# Patient Record
Sex: Male | Born: 1945 | Race: White | Hispanic: No | Smoking: Former smoker
Health system: Southern US, Community
[De-identification: ages and names within clinical notes are randomized; demographics above are authoritative.]

## PROBLEM LIST (undated history)

## (undated) DIAGNOSIS — F4310 Post-traumatic stress disorder, unspecified: Secondary | ICD-10-CM

## (undated) DIAGNOSIS — G4730 Sleep apnea, unspecified: Secondary | ICD-10-CM

## (undated) DIAGNOSIS — I2510 Atherosclerotic heart disease of native coronary artery without angina pectoris: Secondary | ICD-10-CM

## (undated) DIAGNOSIS — Z7739 Contact with and (suspected) exposure to other war theater: Secondary | ICD-10-CM

## (undated) DIAGNOSIS — Z77098 Contact with and (suspected) exposure to other hazardous, chiefly nonmedicinal, chemicals: Secondary | ICD-10-CM

## (undated) DIAGNOSIS — E785 Hyperlipidemia, unspecified: Secondary | ICD-10-CM

## (undated) DIAGNOSIS — J449 Chronic obstructive pulmonary disease, unspecified: Secondary | ICD-10-CM

## (undated) HISTORY — PX: COLONOSCOPY: SHX174

## (undated) HISTORY — DX: Sleep apnea, unspecified: G47.30

## (undated) HISTORY — PX: OTHER SURGICAL HISTORY: SHX169

## (undated) HISTORY — DX: Post-traumatic stress disorder, unspecified: F43.10

## (undated) HISTORY — DX: Chronic obstructive pulmonary disease, unspecified: J44.9

## (undated) HISTORY — DX: Contact with and (suspected) exposure to other war theater: Z77.39

## (undated) HISTORY — DX: Contact with and (suspected) exposure to other hazardous, chiefly nonmedicinal, chemicals: Z77.098

## (undated) HISTORY — PX: TONSILLECTOMY: SUR1361

---

## 1966-04-22 DIAGNOSIS — R69 Illness, unspecified: Secondary | ICD-10-CM | POA: Insufficient documentation

## 2005-03-26 ENCOUNTER — Ambulatory Visit: Payer: Self-pay | Admitting: Internal Medicine

## 2005-04-08 ENCOUNTER — Ambulatory Visit: Payer: Self-pay

## 2005-05-28 ENCOUNTER — Encounter: Payer: Self-pay | Admitting: Internal Medicine

## 2005-05-28 ENCOUNTER — Ambulatory Visit: Payer: Self-pay | Admitting: *Deleted

## 2005-06-06 ENCOUNTER — Ambulatory Visit: Payer: Self-pay | Admitting: Cardiovascular Disease

## 2005-06-06 ENCOUNTER — Ambulatory Visit (HOSPITAL_COMMUNITY): Admission: RE | Admit: 2005-06-06 | Discharge: 2005-06-06 | Payer: Self-pay | Admitting: Cardiovascular Disease

## 2005-06-26 ENCOUNTER — Ambulatory Visit: Payer: Self-pay | Admitting: *Deleted

## 2005-06-26 ENCOUNTER — Encounter: Payer: Self-pay | Admitting: Internal Medicine

## 2005-08-09 ENCOUNTER — Ambulatory Visit: Payer: Self-pay | Admitting: Gastroenterology

## 2005-08-16 ENCOUNTER — Ambulatory Visit: Payer: Self-pay | Admitting: Gastroenterology

## 2005-08-16 ENCOUNTER — Encounter: Payer: Self-pay | Admitting: Internal Medicine

## 2006-01-08 ENCOUNTER — Ambulatory Visit: Payer: Self-pay | Admitting: Internal Medicine

## 2006-01-14 ENCOUNTER — Ambulatory Visit: Payer: Self-pay | Admitting: Internal Medicine

## 2006-01-24 ENCOUNTER — Ambulatory Visit: Payer: Self-pay | Admitting: Cardiology

## 2006-03-05 ENCOUNTER — Ambulatory Visit: Payer: Self-pay | Admitting: Internal Medicine

## 2006-06-20 ENCOUNTER — Ambulatory Visit: Payer: Self-pay | Admitting: Internal Medicine

## 2007-12-24 ENCOUNTER — Telehealth (INDEPENDENT_AMBULATORY_CARE_PROVIDER_SITE_OTHER): Payer: Self-pay | Admitting: *Deleted

## 2007-12-25 ENCOUNTER — Ambulatory Visit: Payer: Self-pay | Admitting: Internal Medicine

## 2007-12-25 DIAGNOSIS — J449 Chronic obstructive pulmonary disease, unspecified: Secondary | ICD-10-CM | POA: Insufficient documentation

## 2007-12-29 ENCOUNTER — Encounter (INDEPENDENT_AMBULATORY_CARE_PROVIDER_SITE_OTHER): Payer: Self-pay | Admitting: *Deleted

## 2007-12-30 ENCOUNTER — Telehealth (INDEPENDENT_AMBULATORY_CARE_PROVIDER_SITE_OTHER): Payer: Self-pay | Admitting: *Deleted

## 2007-12-30 LAB — CONVERTED CEMR LAB
Chloride: 107 meq/L (ref 96–112)
Eosinophils Absolute: 0.2 10*3/uL (ref 0.0–0.7)
HCT: 43.3 % (ref 39.0–52.0)
HDL: 55.4 mg/dL (ref >39.0)
MCV: 101.5 fL — ABNORMAL HIGH (ref 78.0–100.0)
Monocytes Absolute: 0.5 10*3/uL (ref 0.1–1.0)
Neutrophils Relative %: 63.7 % (ref 43.0–77.0)
Platelets: 290 10*3/uL (ref 150–400)
Potassium: 4.4 meq/L (ref 3.5–5.1)
RDW: 12.4 % (ref 11.5–14.6)
Sodium: 142 meq/L (ref 135–145)
Total CHOL/HDL Ratio: 3.1
Triglycerides: 52 mg/dL (ref 0–149)
VLDL: 10 mg/dL (ref 0–40)
WBC: 7 10*3/uL (ref 4.5–10.5)

## 2008-01-07 ENCOUNTER — Ambulatory Visit: Payer: Self-pay | Admitting: Cardiology

## 2008-01-14 ENCOUNTER — Encounter: Payer: Self-pay | Admitting: Internal Medicine

## 2008-01-14 ENCOUNTER — Ambulatory Visit: Payer: Self-pay

## 2008-01-19 ENCOUNTER — Ambulatory Visit: Payer: Self-pay | Admitting: Cardiology

## 2008-01-19 LAB — CONVERTED CEMR LAB
BUN: 17 mg/dL (ref 6–23)
Basophils Relative: 0.8 % (ref 0.0–3.0)
Creatinine, Ser: 0.9 mg/dL (ref 0.4–1.5)
Eosinophils Absolute: 0.2 10*3/uL (ref 0.0–0.7)
Eosinophils Relative: 3.1 % (ref 0.0–5.0)
GFR calc Af Amer: 110 mL/min
GFR calc non Af Amer: 91 mL/min
Glucose, Bld: 85 mg/dL (ref 70–99)
HCT: 37.8 % — ABNORMAL LOW (ref 39.0–52.0)
Hemoglobin: 13.2 g/dL (ref 13.0–17.0)
MCV: 99.4 fL (ref 78.0–100.0)
Monocytes Absolute: 0.7 10*3/uL (ref 0.1–1.0)
Monocytes Relative: 9 % (ref 3.0–12.0)
RBC: 3.8 M/uL — ABNORMAL LOW (ref 4.22–5.81)
WBC: 7.5 10*3/uL (ref 4.5–10.5)

## 2008-01-21 ENCOUNTER — Inpatient Hospital Stay (HOSPITAL_BASED_OUTPATIENT_CLINIC_OR_DEPARTMENT_OTHER): Admission: RE | Admit: 2008-01-21 | Discharge: 2008-01-21 | Payer: Self-pay | Admitting: Cardiovascular Disease

## 2008-01-21 ENCOUNTER — Ambulatory Visit: Payer: Self-pay | Admitting: Cardiovascular Disease

## 2008-01-22 ENCOUNTER — Telehealth (INDEPENDENT_AMBULATORY_CARE_PROVIDER_SITE_OTHER): Payer: Self-pay | Admitting: *Deleted

## 2008-02-09 ENCOUNTER — Ambulatory Visit: Payer: Self-pay | Admitting: Cardiology

## 2008-02-21 ENCOUNTER — Ambulatory Visit (HOSPITAL_BASED_OUTPATIENT_CLINIC_OR_DEPARTMENT_OTHER): Admission: RE | Admit: 2008-02-21 | Discharge: 2008-02-21 | Payer: Self-pay | Admitting: Psychiatry

## 2008-02-27 ENCOUNTER — Ambulatory Visit: Payer: Self-pay | Admitting: Internal Medicine

## 2008-12-08 ENCOUNTER — Telehealth (INDEPENDENT_AMBULATORY_CARE_PROVIDER_SITE_OTHER): Payer: Self-pay | Admitting: *Deleted

## 2008-12-16 ENCOUNTER — Ambulatory Visit: Payer: Self-pay | Admitting: Internal Medicine

## 2008-12-16 ENCOUNTER — Inpatient Hospital Stay (HOSPITAL_COMMUNITY): Admission: AD | Admit: 2008-12-16 | Discharge: 2008-12-19 | Payer: Self-pay | Admitting: Internal Medicine

## 2008-12-16 DIAGNOSIS — F4310 Post-traumatic stress disorder, unspecified: Secondary | ICD-10-CM | POA: Insufficient documentation

## 2008-12-16 DIAGNOSIS — IMO0002 Reserved for concepts with insufficient information to code with codable children: Secondary | ICD-10-CM | POA: Insufficient documentation

## 2008-12-16 DIAGNOSIS — F411 Generalized anxiety disorder: Secondary | ICD-10-CM | POA: Insufficient documentation

## 2008-12-29 ENCOUNTER — Ambulatory Visit: Payer: Self-pay | Admitting: Internal Medicine

## 2008-12-29 DIAGNOSIS — Z87891 Personal history of nicotine dependence: Secondary | ICD-10-CM | POA: Insufficient documentation

## 2009-07-06 ENCOUNTER — Telehealth: Payer: Self-pay | Admitting: Internal Medicine

## 2009-09-05 ENCOUNTER — Ambulatory Visit: Payer: Self-pay | Admitting: Internal Medicine

## 2009-09-05 DIAGNOSIS — G4733 Obstructive sleep apnea (adult) (pediatric): Secondary | ICD-10-CM | POA: Insufficient documentation

## 2009-09-05 DIAGNOSIS — K219 Gastro-esophageal reflux disease without esophagitis: Secondary | ICD-10-CM | POA: Insufficient documentation

## 2009-09-05 LAB — CONVERTED CEMR LAB
Albumin: 4.2 g/dL (ref 3.5–5.2)
BUN: 23 mg/dL (ref 6–23)
Calcium: 9.4 mg/dL (ref 8.4–10.5)
Chloride: 102 meq/L (ref 96–112)
Glucose, Bld: 77 mg/dL (ref 70–99)
Phosphorus: 3.6 mg/dL (ref 2.3–4.6)

## 2009-09-07 ENCOUNTER — Ambulatory Visit: Payer: Self-pay | Admitting: Internal Medicine

## 2009-10-05 ENCOUNTER — Ambulatory Visit: Payer: Self-pay | Admitting: Internal Medicine

## 2009-10-05 DIAGNOSIS — J984 Other disorders of lung: Secondary | ICD-10-CM | POA: Insufficient documentation

## 2009-10-05 DIAGNOSIS — R918 Other nonspecific abnormal finding of lung field: Secondary | ICD-10-CM | POA: Insufficient documentation

## 2009-12-25 ENCOUNTER — Encounter: Payer: Self-pay | Admitting: Internal Medicine

## 2010-01-17 ENCOUNTER — Encounter: Payer: Self-pay | Admitting: Internal Medicine

## 2010-01-17 ENCOUNTER — Ambulatory Visit: Payer: Self-pay | Admitting: Internal Medicine

## 2010-01-17 DIAGNOSIS — R1013 Epigastric pain: Secondary | ICD-10-CM | POA: Insufficient documentation

## 2010-01-17 DIAGNOSIS — R109 Unspecified abdominal pain: Secondary | ICD-10-CM | POA: Insufficient documentation

## 2010-01-17 LAB — CONVERTED CEMR LAB
Albumin: 4.3 g/dL (ref 3.5–5.2)
Alkaline Phosphatase: 74 units/L (ref 39–117)
Basophils Absolute: 0.1 10*3/uL (ref 0.0–0.1)
Basophils Relative: 0.8 % (ref 0.0–3.0)
Eosinophils Absolute: 0.3 10*3/uL (ref 0.0–0.7)
H Pylori IgG: NEGATIVE
Hemoglobin: 14.7 g/dL (ref 13.0–17.0)
Lymphocytes Relative: 14.5 % (ref 12.0–46.0)
MCHC: 34.4 g/dL (ref 30.0–36.0)
MCV: 102.8 fL — ABNORMAL HIGH (ref 78.0–100.0)
Monocytes Absolute: 0.9 10*3/uL (ref 0.1–1.0)
Neutro Abs: 7.9 10*3/uL — ABNORMAL HIGH (ref 1.4–7.7)
RDW: 13.4 % (ref 11.5–14.6)
Total Protein: 7.7 g/dL (ref 6.0–8.3)

## 2010-01-23 ENCOUNTER — Telehealth: Payer: Self-pay | Admitting: Internal Medicine

## 2010-02-12 ENCOUNTER — Telehealth (INDEPENDENT_AMBULATORY_CARE_PROVIDER_SITE_OTHER): Payer: Self-pay | Admitting: *Deleted

## 2010-02-12 ENCOUNTER — Encounter: Payer: Self-pay | Admitting: Internal Medicine

## 2010-02-21 ENCOUNTER — Ambulatory Visit: Payer: Self-pay | Admitting: Internal Medicine

## 2010-02-21 DIAGNOSIS — K5732 Diverticulitis of large intestine without perforation or abscess without bleeding: Secondary | ICD-10-CM | POA: Insufficient documentation

## 2010-02-21 LAB — CONVERTED CEMR LAB
BUN: 24 mg/dL — ABNORMAL HIGH (ref 6–23)
Basophils Relative: 0.6 % (ref 0.0–3.0)
CO2: 31 meq/L (ref 19–32)
Chloride: 102 meq/L (ref 96–112)
Creatinine, Ser: 0.8 mg/dL (ref 0.4–1.5)
Eosinophils Absolute: 0.2 10*3/uL (ref 0.0–0.7)
Eosinophils Relative: 2.7 % (ref 0.0–5.0)
Hemoglobin: 14.4 g/dL (ref 13.0–17.0)
Lymphocytes Relative: 20.4 % (ref 12.0–46.0)
Monocytes Relative: 8.6 % (ref 3.0–12.0)
Neutro Abs: 6.3 10*3/uL (ref 1.4–7.7)
Neutrophils Relative %: 67.7 % (ref 43.0–77.0)
Potassium: 4 meq/L (ref 3.5–5.1)
RBC: 4.04 M/uL — ABNORMAL LOW (ref 4.22–5.81)
WBC: 9.3 10*3/uL (ref 4.5–10.5)

## 2010-03-05 ENCOUNTER — Ambulatory Visit: Payer: Self-pay | Admitting: Internal Medicine

## 2010-05-12 ENCOUNTER — Encounter: Payer: Self-pay | Admitting: Internal Medicine
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
## 2010-05-22 NOTE — Letter (Signed)
Summary: Health Risk Assessment/[employer]  Health Risk Assessment/[employer]   Imported By: Maryln Gottron 01/15/2010 10:49:50  _____________________________________________________________________  External Attachment:    Type:   Image     Comment:   External Document
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
## 2010-05-22 NOTE — Progress Notes (Signed)
  Phone Note Call from Patient Call back at Home Phone [phone]   Caller: Patient Call For: Stacie Glaze MD Reason for Call: Acute Illness Action Taken: Provider Notified Summary of Call: Pt is still having abdomen pain that is worse at night, and does not seem to be getting any better. Initial call taken by: Lynann Beaver CMA,  January 23, 2010 12:03 PM  Follow-up for Phone Call        per dr Turner Daniels wants to try cipro 500 two times a day and flagyl 500 bid for 10 day Follow-up by: Willy Eddy, LPN,  January 23, 2010 12:07 PM    New/Updated Medications: CIPRO 500 MG TABS (CIPROFLOXACIN HCL) one by mouth two times a day x 10 days FLAGYL 500 MG TABS (METRONIDAZOLE) one by mouth bid Prescriptions: FLAGYL 500 MG TABS (METRONIDAZOLE) one by mouth bid  #20 x 0   Entered by:   Lynann Beaver CMA   Authorized by:   Stacie Glaze MD   Signed by:   Lynann Beaver CMA on 01/23/2010   Method used:   Electronically to        [facility]  [address] [phone][facility] (retail)       [address]       [place], [place]  [id number]       Ph: [phone] or [phone]       Fax: [phone]   RxID:   [id number] CIPRO 500 MG TABS (CIPROFLOXACIN HCL) one by mouth two times a day x 10 days  #20 x 0   Entered by:   Lynann Beaver CMA   Authorized by:   Stacie Glaze MD   Signed by:   Lynann Beaver CMA on 01/23/2010   Method used:   Electronically to        [facility]  [address] [phone]* (retail)       [address]       [place], [place]  [id number]       Ph: [phone] or [phone]       Fax: [phone]   RxID:   [id number]  Pt. notified.

## 2010-05-22 NOTE — Progress Notes (Signed)
Summary: New pt for Dr. Orinda Kenner  Phone Note Call from Patient   Caller: Daughter Call For: Dr. Lovell Sheehan Summary of Call: Pt would like to be scheduled with Dr. Lovell Sheehan as a new pt.  Per Dr. Lovell Sheehan.......schedule in a new pt slot. Dedra Skeens (daughter spoke to Dr. Lovell Sheehan). Initial call taken by: Lynann Beaver CMA,  July 06, 2009 1:15 PM  Follow-up for Phone Call        may schdule in 30 min spot Follow-up by: Stacie Glaze MD,  July 07, 2009 8:38 AM  Additional Follow-up for Phone Call Additional follow up Details #1::        09-05-2009 245pm Additional Follow-up by: Heron Sabins,  July 07, 2009 10:18 AM
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
## 2010-05-22 NOTE — Progress Notes (Signed)
  Pt signed ROI, mailed out records today along w/ a CD of his CT [facility]  February 12, 2010 1:28 PM

## 2010-07-04 LAB — PULMONARY FUNCTION TEST

## 2010-07-28 LAB — COMPREHENSIVE METABOLIC PANEL
Albumin: 3.3 g/dL — ABNORMAL LOW (ref 3.5–5.2)
BUN: 6 mg/dL (ref 6–23)
Calcium: 8.8 mg/dL (ref 8.4–10.5)
Creatinine, Ser: 0.8 mg/dL (ref 0.4–1.5)
Glucose, Bld: 101 mg/dL — ABNORMAL HIGH (ref 70–99)
Potassium: 3.9 mEq/L (ref 3.5–5.1)
Total Protein: 6.4 g/dL (ref 6.0–8.3)

## 2010-07-28 LAB — BASIC METABOLIC PANEL
Calcium: 8.7 mg/dL (ref 8.4–10.5)
GFR calc Af Amer: >60 mL/min (ref >60)
GFR calc non Af Amer: >60 mL/min (ref >60)
Sodium: 133 mEq/L — ABNORMAL LOW (ref 135–145)

## 2010-07-28 LAB — PROTIME-INR: INR: 1 (ref 0.00–1.49)

## 2010-07-28 LAB — CBC
HCT: 35.3 % — ABNORMAL LOW (ref 39.0–52.0)
Hemoglobin: 13.2 g/dL (ref 13.0–17.0)
MCHC: 34.3 g/dL (ref 30.0–36.0)
MCV: 101.9 fL — ABNORMAL HIGH (ref 78.0–100.0)
Platelets: 209 10*3/uL (ref 150–400)
RBC: 3.79 MIL/uL — ABNORMAL LOW (ref 4.22–5.81)
RDW: 12.7 % (ref 11.5–15.5)
RDW: 13.4 % (ref 11.5–15.5)

## 2010-07-28 LAB — DIFFERENTIAL
Basophils Absolute: 0 10*3/uL (ref 0.0–0.1)
Lymphocytes Relative: 9 % — ABNORMAL LOW (ref 12–46)
Monocytes Absolute: 0.8 10*3/uL (ref 0.1–1.0)
Monocytes Relative: 6 % (ref 3–12)
Neutro Abs: 11.5 10*3/uL — ABNORMAL HIGH (ref 1.7–7.7)
Neutrophils Relative %: 84 % — ABNORMAL HIGH (ref 43–77)

## 2010-07-28 LAB — CULTURE, ROUTINE-ABSCESS

## 2010-07-28 LAB — ANAEROBIC CULTURE

## 2010-07-28 LAB — TISSUE CULTURE

## 2010-07-28 LAB — APTT: aPTT: 28 seconds (ref 24–37)
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
## 2010-09-04 NOTE — Assessment & Plan Note (Signed)
[facility]                            CARDIOLOGY OFFICE NOTE   Arthur, Harper                    MRN:          [id number]  DATE:02/09/2008                            DOB:          06/14/1945    [name] returns today after having a cardiac catheterization for  left-sided chest pain and a positive stress Myoview showing inferior  ischemia with a question of scar.   Foreseeing, his cardiac catheterization showed no significant coronary  artery disease.  He has normal left ventricular function.   Looking back to his chart, which I did not have on his original visit,  he had previous CT angiogram done by Dr. Charlton Haws in February 2007.  At that time, he had no significant coronary artery disease as well.   MEDICATIONS:  He is on enteric-coated aspirin 81 mg a day and  multivitamin.   He continues to have intermittent chest discomfort that sounds atypical.   PHYSICAL EXAMINATION:  VITAL SIGNS:  His blood pressure is 124/78, his  pulse is 72 and regular, his weight is 188.  HEENT:  Unchanged.  NECK:  Carotids are full.  Thyroid is not enlarged.  LUNGS:  Clear to auscultation and percussion.  HEART:  Regular rate and rhythm.  No gallop.  ABDOMEN:  Soft, good bowel sounds.  No midline bruit.  EXTREMITIES:  No cyanosis, clubbing, or edema.  Pulses are intact.   [name] is doing well.  We have talked about him stopping smoking,  which he will try to do.  We will plan on seeing him back on a p.r.n.  basis.     Thomas C. Daleen Squibb, MD, [facility]  Electronically Signed    TCW/MedQ  DD: 02/09/2008  DT: 02/10/2008  Job #: [id number]   cc:   Willow Ora, MD
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
## 2010-09-04 NOTE — Procedures (Signed)
NAME:  Arthur Harper, Arthur Harper           ACCOUNT NO.:  [id number]   MEDICAL RECORD NO.:  [id number]          PATIENT TYPE:  OUT   LOCATION:  SLEEP CENTER                 FACILITY:  [facility]   PHYSICIAN:  Clinton D. Maple Hudson, MD, FCCP, FACPDATE OF BIRTH:  1945-05-18   DATE OF STUDY:  02/21/2008                            NOCTURNAL POLYSOMNOGRAM   REFERRING PHYSICIAN:  August Saucer   REFERRING PHYSICIAN:  Dr. August Saucer   INDICATION FOR STUDY:  Insomnia with sleep apnea.   EPWORTH SLEEPINESS SCORE:  Epworth sleepiness score 18/24. BMI 26.9.  Weight 182 pounds.  Height 69 inches.  Neck 16.5 inches.   MEDICATIONS:  Home medications are charted and reviewed.   SLEEP ARCHITECTURE:  Total sleep time 310 minutes with sleep efficiency  of 63.8%.  Stage I was 15.9%.  Stage II 58%.  Stage III 14.3%.  REM  11.8% of total sleep time.  Sleep latency 106 minutes.  REM latency 186  minutes. Awake after sleep onset 70 minutes.  Arousal index 28.2.  No  bedtime medication was taken.  Sustained sleep onset was at about 2300  hours with intervals of waking through the night and REM noted at around  4 a.m.   RESPIRATORY DATA:  Apnea-hypopnea index (AHI) 14.9 per hour.  A total of  77 events were recorded including 63 obstructive apneas, 4 central  apneas, 2 mixed apneas, and 8 hypopneas.  The events were not  positional.  REM AHI 4.9.  Split-study protocol was requested, but there  were insufficient events recorded during the initial hours as required  by protocol to permit time for CPAP titration on the study night.   OXYGEN DATA:  Loud snoring with oxygen desaturation to a nadir of 87%.  Mean oxygen saturation through the study was 94.1% on room air.  A total  of 0.2 minutes was recorded with oxygen saturation less than 88% on room  air.   CARDIAC DATA:  Normal sinus rhythm.   MOVEMENT-PARASOMNIA:  Periodic limb movements with arousal.  A total of  293 limb movements were recorded of which 23 were  associated with  arousal or awakening for an index of 4.4 per hour.  Intervals of sleep  talking were recorded during stage II sleep without associated motor  activity.   IMPRESSIONS-RECOMMENDATIONS:  1. Mild-to-moderate obstructive sleep apnea/hypopnea syndrome, AHI      14.9 per hour with nonpositional events, loud snoring, and oxygen      desaturation to a nadir of 87%.  2. Sleep talking associated with stage II sleep without associated EEG      or motor movement abnormality.  3. Consider return for CPAP titration or evaluate for alternative      management as clinically indicated.      Clinton D. Maple Hudson, MD, [facility], FACP  Diplomate, Biomedical engineer of Sleep Medicine  Electronically Signed     CDY/MEDQ  D:  02/27/2008 15:47:00  T:  02/27/2008 21:17:37  Job:  [id number]
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
## 2010-09-04 NOTE — Cardiovascular Report (Signed)
NAMEJUSTYN, LANGHAM           ACCOUNT NO.:  [id number]   MEDICAL RECORD NO.:  [id number]          PATIENT TYPE:  OIB   LOCATION:  1962                         FACILITY:  [facility]   PHYSICIAN:  Verne Carrow, MDDATE OF BIRTH:  Aug 31, 1945   DATE OF PROCEDURE:  01/21/2008  DATE OF DISCHARGE:  01/21/2008                            CARDIAC CATHETERIZATION   INDICATION:  Chest pain in a 65 year old male with a history of tobacco  abuse who was found on a nuclear stress test to have an inferior scar  with peri-infarct ischemia.   OPERATOR:  Verne Carrow, MD   PROCEDURES PERFORMED:  1. Left heart catheterization.  2. Selective coronary angiography.  3. Left ventricular angiogram.   DETAILS OF PROCEDURE:  The patient was brought into the Outpatient Heart  Catheterization Laboratory after signing informed consent.  The right  groin was prepped and draped in a sterile fashion.  A 4-French sheath  was inserted into the right femoral artery.  A JL-5 diagnostic catheter  was used to selectively engage the left main coronary artery and inject  the left coronary system.  A 3DRC catheter was used to selectively  engage and inject the right coronary artery.  A 4-French pigtail  catheter was then used to cross the aortic valve into the left  ventricle.  Following a left ventricular angiogram, catheters were  pullback across the aortic valve with no significant pressure gradient  measured.   ANGIOGRAPHIC FINDINGS:  1. The left main coronary artery has no disease and bifurcates into      the circumflex and the LAD.  2. The left anterior descending artery courses to the apex and has no      angiographic evidence of disease.  It gives off a large diagonal      branch that is free of disease.  There is a smaller second diagonal      branch that is free of disease.  3. The circumflex gives off a small high obtuse marginal branch that      is free of disease and a larger second obtuse  marginal branch that      is free of disease.  4. The right coronary artery is a large dominant vessel that gives off      both the posterior descending branch and a posterolateral branch.      There is no disease noted in the right coronary artery.  5. Left ventricular angiogram demonstrated normal systolic function      with no wall motion abnormalities.  Ejection fraction was estimated      at 55%.   HEMODYNAMIC FINDINGS:  Left ventricular pressure 107/12, end-diastolic  pressure 21, central aortic pressure 110/61.   IMPRESSION:  1. Normal coronary arteries.  2. Normal left ventricular systolic function.   RECOMMENDATIONS:  I do not recommend any further cardiac workup of this  patient's chest pain at the current time.  The patient will be kept here  and monitored with 2 hours of bedrest following the sheath removal.  He  will be discharged to home from an outpatient holding area if he does  well with  his sheath removal and bedrest.  We will plan on having him  followup with Dr. Maisie Fus wall in 3-4 weeks in the [place] office.      Verne Carrow, MD  Electronically Signed     CM/MEDQ  D:  01/21/2008  T:  01/21/2008  Job:  [id number]

## 2010-09-04 NOTE — Op Note (Signed)
Arthur Harper, Arthur Harper           ACCOUNT NO.:  [id number]   MEDICAL RECORD NO.:  [id number]          PATIENT TYPE:  INP   LOCATION:  3737                         FACILITY:  [facility]   PHYSICIAN:  Madelynn Done, MD  DATE OF BIRTH:  04/23/1945   DATE OF PROCEDURE:  12/17/2008  DATE OF DISCHARGE:                               OPERATIVE REPORT   PREOPERATIVE DIAGNOSIS:  Right elbow septic bursitis.   POSTOPERATIVE DIAGNOSIS:  Right elbow septic bursitis.   ATTENDING PHYSICIAN:  Madelynn Done, MD, who scrubbed and present  for the entire procedure.   ASSISTANT SURGEON:  None.   SURGICAL PROCEDURES:  1. Right elbow incision and drainage of olecranon bursa.  2. Olecranon bursectomy.   INTRAOPERATIVE FINDINGS:  The patient did have semi-purulent olecranon  bursal fluid as well as infectious-looking tissue.  Tissue cultures and  fluid cultures were taken.   ANESTHESIA:  General via LMA.   TOURNIQUET TIME:  Zero minutes.   DRAINS:  Two small vessel loops.   SURGICAL INDICATIONS:  Arthur Harper is a 65 year old gentleman with  worsening right elbow pain who was admitted for worsening elbow likely  infection.  The patient was seen and evaluated.  Based on the subjective  and objective findings, it was felt that the patient had a septic  bursitis of the right proximal olecranon.  The risks, benefits, and  alternatives were discussed in detail with the patient and signed  informed consent was obtained.  Risks include but not limited to  bleeding; infection; damage to nearby nerves, arteries or tendons;  recurrent infection; loss motion of the elbow; and need for further  surgical intervention.   DESCRIPTION OF PROCEDURE:  The patient was properly identified in the  preoperative holding area and a mark with a permanent marker made on the  right elbow to indicate correct operative site.  The patient was then  brought back to the operating room, placed supine on the anesthesia  room  table where general anesthesia was administered.  The patient tolerated  this well.  A well-padded tourniquet was then placed on the right  brachium and sealed with a 1000 drape.  The right upper extremity was  then prepped with Hibiclens and sterilely draped.  Time-out was called,  correct site was identified, and procedure then begun.  A curvilinear  incision was then made directly over the proximal olecranon tip curving  radially.  Dissection was then carried down through the skin and  subcutaneous tissues after the appropriate time-out was called.  The  semi-purulent fluid was encountered.  The fluid cultures were then  taken.  Radical olecranon bursectomy was then carried out throughout the  posterior region of the elbow remaining any what appeared to be  nonhealthy and potentially infectious tissue.  This was carried out  sharply with the knife as well as a rongeur.  After bursectomy, the  wound was then thoroughly irrigated.  Copious irrigations were run  throughout the incision site.  Hemostasis was obtained with  electrocautery.  The wound was then copiously irrigated.  The skin was  then closed over 2  small vessel loops with 3-0 nylon suture.  Xeroform  dressings, sterile compressive bandage was then applied.  The patient  was then placed in a well-padded long-arm splint posteriorly.  The  patient was then extubated and taken to recovery room in good condition.   POSTOPERATIVE PLAN:  The patient is continued on IV antibiotics, await  his wound cultures and tissue culture results.  The wound check in  approximately 48 hours to see how he responds to the inpatient IV  antibiotics and a postprocedural care.      Madelynn Done, MD  Electronically Signed     FWO/MEDQ  D:  12/17/2008  T:  12/18/2008  Job:  [id number]

## 2010-09-05 ENCOUNTER — Telehealth: Payer: Self-pay | Admitting: Internal Medicine

## 2010-09-05 NOTE — Telephone Encounter (Signed)
Daughter will bring in ct for dr Lovell Sheehan to see

## 2010-09-05 NOTE — Telephone Encounter (Signed)
Pt had a CT Scan done at [facility] 07/2010 in [place], [place]. Pt is needing Dr Lovell Sheehan to req the results of CT and review them. Pt is req for Dr Lovell Sheehan to call him.

## 2010-09-28 ENCOUNTER — Encounter: Payer: Self-pay | Admitting: Gastroenterology

## 2011-10-15 ENCOUNTER — Encounter: Payer: Self-pay | Admitting: Internal Medicine

## 2011-10-15 ENCOUNTER — Ambulatory Visit (INDEPENDENT_AMBULATORY_CARE_PROVIDER_SITE_OTHER): Payer: Medicare Other | Admitting: Internal Medicine

## 2011-10-15 VITALS — BP 120/76 | HR 72 | Temp 98.6°F | Resp 16 | Ht 69.5 in | Wt 199.0 lb

## 2011-10-15 DIAGNOSIS — R918 Other nonspecific abnormal finding of lung field: Secondary | ICD-10-CM

## 2011-10-15 NOTE — Progress Notes (Signed)
  Subjective:    Patient ID: Arthur Harper, male    DOB: [birth year], 66 y.o.   MRN: [id number]  HPI  Patient had a CT scan at the [place] on 07/03/2011 to exam and history of pulmonary nodules.  Most of the nodules including the upper lobe nodules were unchanged a right lower lobe nodule has a slight interval increase from 7 mm to 9 mm.  This is in the setting of emphysema and biapical pleural thickening. 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 Given the exposure history as well as a slight increase in nodule it might be prudent to involve pulmonary consultation with nodule protocol  [facility] as planned a repeat CT scan in 6 months which would be October  Review of Systems  Constitutional: Positive for fatigue.  HENT: Positive for congestion.   Respiratory: Positive for chest tightness and shortness of breath. Negative for wheezing and stridor.   Cardiovascular: Positive for palpitations and leg swelling. Negative for chest pain.  Gastrointestinal: Positive for abdominal distention.       Objective:   Physical Exam  Nursing note and vitals reviewed. Constitutional: He appears well-developed and well-nourished.  HENT:  Head: Normocephalic and atraumatic.  Cardiovascular: Normal rate.   Murmur heard. Pulmonary/Chest: No respiratory distress. He has no wheezes. He has no rales.  Abdominal: He exhibits no distension. There is no tenderness.          Assessment & Plan:  Refer to Dr. Delton Coombes to look at this pulmonary nodule studies and to make sure that the appropriate protocol is being followed

## 2011-10-15 NOTE — Patient Instructions (Signed)
I am referring you to a [place] pulmonologist

## 2011-11-29 ENCOUNTER — Encounter: Payer: Self-pay | Admitting: Emergency Medicine

## 2011-11-29 ENCOUNTER — Ambulatory Visit (INDEPENDENT_AMBULATORY_CARE_PROVIDER_SITE_OTHER): Payer: Medicare Other | Admitting: Emergency Medicine

## 2011-11-29 VITALS — BP 122/80 | HR 77 | Temp 98.1°F | Ht 69.5 in | Wt 201.6 lb

## 2011-11-29 DIAGNOSIS — G4733 Obstructive sleep apnea (adult) (pediatric): Secondary | ICD-10-CM

## 2011-11-29 DIAGNOSIS — J984 Other disorders of lung: Secondary | ICD-10-CM

## 2011-11-29 DIAGNOSIS — F17200 Nicotine dependence, unspecified, uncomplicated: Secondary | ICD-10-CM

## 2011-11-29 DIAGNOSIS — J449 Chronic obstructive pulmonary disease, unspecified: Secondary | ICD-10-CM

## 2011-11-29 NOTE — Progress Notes (Signed)
Subjective:    Patient ID: Arthur Harper, male    DOB: 16-Mar-1946, 66 y.o.   MRN: [id number]  HPI 66 yo man, current smoker 40 pk-yrs, followed by Dr Lovell Sheehan for COPD (has been prescribed foradil + asmanex in the past by the [place] but has not taken), OSA on CPAP. 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 He has been followed both here and at the [place] in [place]. Has hx multiple pulmonary nodules dating back to scans done 2007. Most recent was 07/03/2011 in [place] - showed stable scattered nodules in a vascular pattern with a RLL nodule that changed from 7mm to 9mm compared with 02/2010.  No new sx. He does cough, has stable exertional SOB. He believes he has a repeat CT scan chest scheduled for 01/2012.    Review of Systems  Constitutional: Negative for fever, chills, diaphoresis, activity change, appetite change, fatigue and unexpected weight change.  HENT: Negative for hearing loss, ear pain, nosebleeds, congestion, sore throat, rhinorrhea, sneezing, mouth sores, trouble swallowing, voice change, postnasal drip, sinus pressure and tinnitus.   Eyes: Negative for discharge, itching and visual disturbance.  Respiratory: Positive for apnea, cough, shortness of breath and wheezing. Negative for choking and chest tightness.   Cardiovascular: Negative for chest pain and palpitations.  Gastrointestinal: Negative for nausea, vomiting, abdominal pain and constipation.  Genitourinary: Negative for dysuria and difficulty urinating.  Musculoskeletal: Negative for back pain, joint swelling and gait problem.  Skin: Negative for rash.  Neurological: Negative for dizziness, tremors, syncope, weakness, light-headedness, numbness and headaches.  Hematological: Does not bruise/bleed easily.  Psychiatric/Behavioral: Negative for confusion, disturbed wake/sleep cycle and agitation. The patient is not nervous/anxious.     Past Medical History  Diagnosis Date    . COPD (chronic obstructive pulmonary disease)   . PTSD (post-traumatic stress disorder)   . Sleep apnea   . Agent orange exposure      Family History  Problem Relation Age of Onset  . Emphysema Mother   . Emphysema Father      History   Social History  . Marital Status: Divorced    Spouse Name: N/A    Number of Children: N/A  . Years of Education: N/A   Occupational History  . retired     Aeronautical engineer   Social History Main Topics  . Smoking status: Current Everyday Smoker -- 1.0 packs/day for 40 years    Types: Cigarettes  . Smokeless tobacco: Never Used  . Alcohol Use: Yes     occassional  . Drug Use: No  . Sexually Active: Not on file   Other Topics Concern  . Not on file   Social History Narrative  . No narrative on file     No Known Allergies   Outpatient Prescriptions Prior to Visit  Medication Sig Dispense Refill  . aspirin 81 MG chewable tablet Chew 81 mg by mouth daily.      . Multiple Vitamin (MULTIVITAMIN) capsule Take 1 capsule by mouth daily.             Objective:   Physical Exam Filed Vitals:   11/29/11 1541  BP: 122/80  Pulse: 77  Temp: 98.1 F (36.7 C)    Gen: Pleasant, obese, in no distress,  normal affect  ENT: No lesions,  mouth clear,  oropharynx clear, no postnasal drip  Neck: No JVD, no TMG, no carotid bruits  Lungs: No use of accessory muscles, no  dullness to percussion, clear without rales or rhonchi  Cardiovascular: RRR, heart sounds normal, no murmur or gallops, no peripheral edema  Musculoskeletal: No deformities, no cyanosis or clubbing  Neuro: alert, non focal  Skin: Warm, no lesions or rashes      Assessment & Plan:  SLEEP APNEA, OBSTRUCTIVE On CPAP and following at [facility]  PULMONARY NODULE Multiple pulm nodules in a vascular pattern. A RLL nodule appears to be slowly enlarging. He needs a 6 month interval film in September or October 2013. [facility]  COPD He  wanted to discuss the meds that he's ben given by [facility] - he's never used them: proventil prn, Foradil, Asmanex.  - we will trial the albuterol prn, see if he notices benefit. If so then will look at starting the foradil + asmanex   CIGARETTE SMO[employer]ER Not ready to set a quit date at this time

## 2011-11-29 NOTE — Assessment & Plan Note (Signed)
Multiple pulm nodules in a vascular pattern. A RLL nodule appears to be slowly enlarging. He needs a 6 month interval film in September or October 2013. We will get [facility] all the [facility] to review

## 2011-11-29 NOTE — Assessment & Plan Note (Signed)
Not ready to set a quit date at this time

## 2011-11-29 NOTE — Patient Instructions (Addendum)
We will get [facility] of [facility] CT [facility] to [facility] You will need a repeat CT scan of the chest in September or October. Please confirm whether this has already been ordered. If not, we will arrange to have a scan performed Try using Proventil (albuterol) 2 puffs up to every 4 hours if needed for shortness of breath Do not start Foradil or Asmanex yet - we will discuss this next time Follow with Dr Delton Coombes in 1 month

## 2011-11-29 NOTE — Assessment & Plan Note (Signed)
On CPAP and following at [facility]

## 2011-11-29 NOTE — Assessment & Plan Note (Signed)
He wanted to discuss the meds that he's ben given by [facility] - he's never used them: proventil prn, Foradil, Asmanex.  - we will trial the albuterol prn, see if he notices benefit. If so then will look at starting the foradil + asmanex

## 2011-12-31 ENCOUNTER — Encounter: Payer: Self-pay | Admitting: Emergency Medicine

## 2011-12-31 ENCOUNTER — Ambulatory Visit (INDEPENDENT_AMBULATORY_CARE_PROVIDER_SITE_OTHER): Payer: Medicare Other | Admitting: Emergency Medicine

## 2011-12-31 VITALS — BP 110/80 | HR 64 | Temp 98.4°F | Ht 69.5 in | Wt 199.2 lb

## 2011-12-31 DIAGNOSIS — G4733 Obstructive sleep apnea (adult) (pediatric): Secondary | ICD-10-CM

## 2011-12-31 DIAGNOSIS — F17200 Nicotine dependence, unspecified, uncomplicated: Secondary | ICD-10-CM

## 2011-12-31 DIAGNOSIS — J984 Other disorders of lung: Secondary | ICD-10-CM

## 2011-12-31 DIAGNOSIS — J449 Chronic obstructive pulmonary disease, unspecified: Secondary | ICD-10-CM

## 2011-12-31 NOTE — Patient Instructions (Addendum)
We will start foradil twice a day and asmanex once a day Continue to have albuterol available to use 2 puffs if needed for shortness of breath We will review your CT scan in October after it is done Congratulations on your plans to stop smoking. We will help you in any way possible Get your flu shot this Fall Continue your CPAP Follow with Dr Delton Coombes in November

## 2011-12-31 NOTE — Assessment & Plan Note (Signed)
We will start the foradil and asmanex since he already has these Encouraged him in his efforts to stop smoking

## 2011-12-31 NOTE — Assessment & Plan Note (Signed)
On wellbutrin, nicotine patch.,  Quit date is 9/12!!

## 2011-12-31 NOTE — Assessment & Plan Note (Signed)
Scheduled for repeat Ct scan 10/9 at the [place]. Will review when available

## 2011-12-31 NOTE — Progress Notes (Signed)
  Subjective:    Patient ID: Arthur Harper, male    DOB: 1946/04/09, 66 y.o.   MRN: [id number]  HPI 66 yo man, current smoker 40 pk-yrs, followed by Dr Lovell Sheehan for COPD (has been prescribed foradil + asmanex in the past by the [place] but has not taken), OSA on CPAP. 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 He has been followed both here and at the [place] in [place]. Has hx multiple pulmonary nodules dating back to scans done 2007. Most recent was 07/03/2011 in [place] - showed stable scattered nodules in a vascular pattern with a RLL nodule that changed from 7mm to 9mm compared with 02/2010.  No new sx. He does cough, has stable exertional SOB. He believes he has a repeat CT scan chest scheduled for 01/2012.   ROV 12/31/11 -- follow up for COPD, OSA and pulmonary nodules, suspected enlarging RLL nodule based on [facility] films. He did a trial of SABA prn, seemed to benefit from this. He is trying to quit smoking, has a quit date of 9/12 - he is on Wellbutrin right now. He is getting support through the [place]. Planning to quit on same date as his daughter. He is scheduled for repeat CT scan of the chest on 10/9 at the [place] to follow RLL nodule.       Objective:   Physical Exam Filed Vitals:   12/31/11 1323  BP: 110/80  Pulse: 64  Temp: 98.4 F (36.9 C)    Gen: Pleasant, obese, in no distress,  normal affect  ENT: No lesions,  mouth clear,  oropharynx clear, no postnasal drip  Neck: No JVD, no TMG, no carotid bruits  Lungs: No use of accessory muscles, no dullness to percussion, clear without rales or rhonchi  Cardiovascular: RRR, heart sounds normal, no murmur or gallops, no peripheral edema  Musculoskeletal: No deformities, no cyanosis or clubbing  Neuro: alert, non focal  Skin: Warm, no lesions or rashes      Assessment & Plan:  SLEEP APNEA, OBSTRUCTIVE Continue CPAP qhs  COPD We will start the foradil and asmanex since he already has  these Encouraged him in his efforts to stop smoking    CIGARETTE SMOKER On wellbutrin, nicotine patch.,  Quit date is 9/12!!   PULMONARY NODULE Scheduled for repeat Ct scan 10/9 at the [place]. Will review when available

## 2011-12-31 NOTE — Assessment & Plan Note (Signed)
Continue CPAP qhs 

## 2012-02-24 ENCOUNTER — Ambulatory Visit (INDEPENDENT_AMBULATORY_CARE_PROVIDER_SITE_OTHER): Payer: Medicare Other | Admitting: Emergency Medicine

## 2012-02-24 ENCOUNTER — Encounter: Payer: Self-pay | Admitting: Emergency Medicine

## 2012-02-24 VITALS — BP 116/68 | HR 64 | Temp 98.3°F | Ht 69.0 in | Wt 207.0 lb

## 2012-02-24 DIAGNOSIS — J984 Other disorders of lung: Secondary | ICD-10-CM

## 2012-02-24 DIAGNOSIS — J449 Chronic obstructive pulmonary disease, unspecified: Secondary | ICD-10-CM

## 2012-02-24 DIAGNOSIS — G4733 Obstructive sleep apnea (adult) (pediatric): Secondary | ICD-10-CM

## 2012-02-24 NOTE — Assessment & Plan Note (Signed)
CPAP.  

## 2012-02-24 NOTE — Progress Notes (Signed)
  Subjective:    Patient ID: Arthur Harper, male    DOB: May 08, 1945, 66 y.o.   MRN: [id number]  HPI 66 yo man, current smoker 40 pk-yrs, followed by Dr Lovell Sheehan for COPD (has been prescribed foradil + asmanex in the past by the [place] but has not taken), OSA on CPAP. 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 He has been followed both here and at the [place] in [place]. Has hx multiple pulmonary nodules dating back to scans done 2007. Most recent was 07/03/2011 in [place] - showed stable scattered nodules in a vascular pattern with a RLL nodule that changed from 7mm to 9mm compared with 02/2010.  No new sx. He does cough, has stable exertional SOB. He believes he has a repeat CT scan chest scheduled for 01/2012.   ROV 12/31/11 -- follow up for COPD, OSA and pulmonary nodules, suspected enlarging RLL nodule based on [facility] films. He did a trial of SABA prn, seemed to benefit from this. He is trying to quit smoking, has a quit date of 9/12 - he is on Wellbutrin right now. He is getting support through the [place]. Planning to quit on same date as his daughter. He is scheduled for repeat CT scan of the chest on 10/9 at the [place] to follow RLL nodule.    ROV 02/24/12 -- COPD, OSA and pulmonary nodules, suspected enlarging RLL nodule based on [facility] films (7mm > 9mm on march 2013 film). The new film on 10/9 looks to be stable, with possible slight decrease in size of the RLL nodule that was thought to be growing. Since last time he quit smoking! Rare SABA use. He is on foradil and asmanex. Breathing better on the meds. Reliable with CPAP      Objective:   Physical Exam Filed Vitals:   02/24/12 1504  BP: 116/68  Pulse: 64  Temp: 98.3 F (36.8 C)    Gen: Pleasant, obese, in no distress,  normal affect  ENT: No lesions,  mouth clear,  oropharynx clear, no postnasal drip  Neck: No JVD, no TMG, no carotid bruits  Lungs: No use of accessory muscles, no dullness to  percussion, clear without rales or rhonchi  Cardiovascular: RRR, heart sounds normal, no murmur or gallops, no peripheral edema  Musculoskeletal: No deformities, no cyanosis or clubbing  Neuro: alert, non focal  Skin: Warm, no lesions or rashes      Assessment & Plan:  PULMONARY NODULE Scattered stable nodules, and a RLL nodule that had gone from 7mm to 9mm. The repeat film 01/29/12 >> nodule looks stable. [facility] is planning to repeat in 1 year.   COPD He has stopped smoking - asmanex + foradil  SLEEP APNEA, OBSTRUCTIVE CPAP>

## 2012-02-24 NOTE — Assessment & Plan Note (Signed)
Scattered stable nodules, and a RLL nodule that had gone from 7mm to 9mm. The repeat film 01/29/12 >> nodule looks stable. VA is planning to repeat in 1 year.

## 2012-02-24 NOTE — Patient Instructions (Addendum)
CONGRATULATIONS on quitting smoking! Please continue your Asmanex and Foradil Use albuterol inhaler as needed You will need a repeat CT scan of the chest in 1 year through the [place] Follow with Dr Delton Coombes in 6 months or sooner if you have any problems

## 2012-02-24 NOTE — Assessment & Plan Note (Signed)
He has stopped smoking - asmanex + foradil

## 2012-08-13 ENCOUNTER — Encounter: Payer: Self-pay | Admitting: Gastroenterology

## 2012-08-14 ENCOUNTER — Ambulatory Visit (INDEPENDENT_AMBULATORY_CARE_PROVIDER_SITE_OTHER): Payer: Medicare Other | Admitting: Internal Medicine

## 2012-08-14 ENCOUNTER — Encounter: Payer: Self-pay | Admitting: Internal Medicine

## 2012-08-14 VITALS — BP 128/80 | HR 72 | Temp 98.6°F | Resp 16 | Ht 69.0 in | Wt 210.0 lb

## 2012-08-14 DIAGNOSIS — Z23 Encounter for immunization: Secondary | ICD-10-CM

## 2012-08-14 DIAGNOSIS — Z0000 Encounter for general adult medical examination without abnormal findings: Secondary | ICD-10-CM

## 2012-08-14 LAB — BASIC METABOLIC PANEL
Calcium: 9.6 mg/dL (ref 8.4–10.5)
Creatinine, Ser: 0.8 mg/dL (ref 0.4–1.5)
GFR: 98.37 mL/min (ref >60.00)
Sodium: 138 mEq/L (ref 135–145)

## 2012-08-14 LAB — CBC WITH DIFFERENTIAL/PLATELET
Basophils Absolute: 0 10*3/uL (ref 0.0–0.1)
Eosinophils Absolute: 0.3 10*3/uL (ref 0.0–0.7)
HCT: 42.6 % (ref 39.0–52.0)
Hemoglobin: 14.4 g/dL (ref 13.0–17.0)
Lymphs Abs: 1.7 10*3/uL (ref 0.7–4.0)
MCHC: 33.8 g/dL (ref 30.0–36.0)
Monocytes Absolute: 0.7 10*3/uL (ref 0.1–1.0)
Neutro Abs: 3.8 10*3/uL (ref 1.4–7.7)
Platelets: 282 10*3/uL (ref 150.0–400.0)
RDW: 13.2 % (ref 11.5–14.6)

## 2012-08-14 LAB — POCT URINALYSIS DIPSTICK
Bilirubin, UA: NEGATIVE
Ketones, UA: NEGATIVE
Leukocytes, UA: NEGATIVE
pH, UA: 7

## 2012-08-14 LAB — HEPATIC FUNCTION PANEL
Albumin: 4.2 g/dL (ref 3.5–5.2)
Alkaline Phosphatase: 64 U/L (ref 39–117)
Bilirubin, Direct: 0.2 mg/dL (ref 0.0–0.3)
Total Protein: 6.9 g/dL (ref 6.0–8.3)

## 2012-08-14 LAB — LIPID PANEL
HDL: 54.1 mg/dL (ref >39.00)
Total CHOL/HDL Ratio: 3
Triglycerides: 65 mg/dL (ref 0.0–149.0)
VLDL: 13 mg/dL (ref 0.0–40.0)

## 2012-08-14 LAB — TSH: TSH: 1.85 u[IU]/mL (ref 0.35–5.50)

## 2012-08-14 NOTE — Addendum Note (Signed)
Addended by: Willy Eddy on: 08/14/2012 12:28 PM   Modules accepted: Orders

## 2012-08-14 NOTE — Progress Notes (Signed)
Subjective:    Patient ID: Arthur Harper, male    DOB: 29-Jan-1946, 67 y.o.   MRN: [id number]  HPI Weight gain and diet Has stopped smoking CPX Hx of COPD and has stopped smoking  Does not use the inhailer   Review of Systems  Constitutional: Negative for fever and fatigue.  HENT: Negative for hearing loss, congestion, neck pain and postnasal drip.   Eyes: Negative for discharge, redness and visual disturbance.  Respiratory: Negative for cough, shortness of breath and wheezing.   Cardiovascular: Negative for leg swelling.  Gastrointestinal: Negative for abdominal pain, constipation and abdominal distention.  Genitourinary: Negative for urgency and frequency.  Musculoskeletal: Negative for joint swelling and arthralgias.  Skin: Negative for color change and rash.  Neurological: Negative for weakness and light-headedness.  Hematological: Negative for adenopathy.  Psychiatric/Behavioral: Negative for behavioral problems.   Past Medical History  Diagnosis Date  . COPD (chronic obstructive pulmonary disease)   . PTSD (post-traumatic stress disorder)   . Sleep apnea   . Agent orange exposure     History   Social History  . Marital Status: Divorced    Spouse Name: N/A    Number of Children: N/A  . Years of Education: N/A   Occupational History  . retired     Aeronautical engineer   Social History Main Topics  . Smoking status: Former Smoker -- 1.00 packs/day for 40 years    Types: Cigarettes    Quit date: 01/01/2012  . Smokeless tobacco: Never Used     Comment: 10 cigs per day  . Alcohol Use: Yes     Comment: occassional  . Drug Use: No  . Sexually Active: Not on file   Other Topics Concern  . Not on file   Social History Narrative  . No narrative on file    Past Surgical History  Procedure Laterality Date  . Tonsillectomy    . Fx ribs      Family History  Problem Relation Age of Onset  . Emphysema Mother   . Emphysema Father     No Known  Allergies  Current Outpatient Prescriptions on File Prior to Visit  Medication Sig Dispense Refill  . albuterol (PROVENTIL HFA;VENTOLIN HFA) 108 (90 BASE) MCG/ACT inhaler Inhale 2 puffs into the lungs every 6 (six) hours as needed.      Marland Kitchen aspirin 81 MG chewable tablet Chew 81 mg by mouth daily.      . Multiple Vitamin (MULTIVITAMIN) capsule Take 1 capsule by mouth daily.       No current facility-administered medications on file prior to visit.    BP 128/80  Pulse 72  Temp(Src) 98.6 F (37 C)  Resp 16  Ht 5\' 9"  (1.753 m)  Wt 210 lb (95.255 kg)  BMI 31 kg/m2   Past Medical History  Diagnosis Date  . COPD (chronic obstructive pulmonary disease)   . PTSD (post-traumatic stress disorder)   . Sleep apnea   . Agent orange exposure     History   Social History  . Marital Status: Divorced    Spouse Name: N/A    Number of Children: N/A  . Years of Education: N/A   Occupational History  . retired     Aeronautical engineer   Social History Main Topics  . Smoking status: Former Smoker -- 1.00 packs/day for 40 years    Types: Cigarettes    Quit date: 01/01/2012  . Smokeless tobacco: Never Used     Comment: 10  cigs per day  . Alcohol Use: Yes     Comment: occassional  . Drug Use: No  . Sexually Active: Not on file   Other Topics Concern  . Not on file   Social History Narrative  . No narrative on file    Past Surgical History  Procedure Laterality Date  . Tonsillectomy    . Fx ribs      Family History  Problem Relation Age of Onset  . Emphysema Mother   . Emphysema Father     No Known Allergies  Current Outpatient Prescriptions on File Prior to Visit  Medication Sig Dispense Refill  . albuterol (PROVENTIL HFA;VENTOLIN HFA) 108 (90 BASE) MCG/ACT inhaler Inhale 2 puffs into the lungs every 6 (six) hours as needed.      Marland Kitchen aspirin 81 MG chewable tablet Chew 81 mg by mouth daily.      . Multiple Vitamin (MULTIVITAMIN) capsule Take 1 capsule by mouth daily.        No current facility-administered medications on file prior to visit.    BP 128/80  Pulse 72  Temp(Src) 98.6 F (37 C)  Resp 16  Ht 5\' 9"  (1.753 m)  Wt 210 lb (95.255 kg)  BMI 31 kg/m2        Objective:   Physical Exam  Nursing note and vitals reviewed. Constitutional: He is oriented to person, place, and time. He appears well-developed and well-nourished.  HENT:  Head: Normocephalic.  Eyes: Conjunctivae are normal. Pupils are equal, round, and reactive to light.  Cardiovascular: Normal rate and regular rhythm.   Pulmonary/Chest: Effort normal and breath sounds normal.  Abdominal: Soft. Bowel sounds are normal.  Genitourinary: Rectum normal.  ,mild BPH  Neurological: He is alert and oriented to person, place, and time.  Skin: Skin is warm.  Psychiatric: He has a normal mood and affect. His behavior is normal.          Assessment & Plan:   Patient presents for yearly preventative medicine examination.   all immunizations and health maintenance protocols were reviewed with the patient and they are up to date with these protocols.   screening laboratory values were reviewed with the patient including screening of hyperlipidemia PSA renal function and hepatic function.   There medications past medical history social history problem list and allergies were reviewed in detail.   Goals were established with regard to weight loss exercise diet in compliance with medications

## 2012-08-14 NOTE — Patient Instructions (Addendum)
The patient is instructed to continue all medications as prescribed. Schedule followup with check out clerk upon leaving the clinic  

## 2012-09-02 ENCOUNTER — Encounter: Payer: Self-pay | Admitting: Emergency Medicine

## 2012-09-02 ENCOUNTER — Ambulatory Visit (INDEPENDENT_AMBULATORY_CARE_PROVIDER_SITE_OTHER): Payer: Medicare Other | Admitting: Emergency Medicine

## 2012-09-02 VITALS — BP 110/80 | HR 70 | Temp 97.4°F | Ht 67.0 in | Wt 209.4 lb

## 2012-09-02 DIAGNOSIS — J984 Other disorders of lung: Secondary | ICD-10-CM

## 2012-09-02 DIAGNOSIS — G4733 Obstructive sleep apnea (adult) (pediatric): Secondary | ICD-10-CM

## 2012-09-02 DIAGNOSIS — J449 Chronic obstructive pulmonary disease, unspecified: Secondary | ICD-10-CM

## 2012-09-02 NOTE — Assessment & Plan Note (Signed)
-   planning for repeat CT scan chest in a year, last was 10/'13, to be done at the [place].
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
## 2012-09-02 NOTE — Patient Instructions (Addendum)
Get your CT scan in October through the [facility] Get your CPAP repaired  Continue to use proventil 2 puffs up to every 4 hours if needed for shortness of breath Follow with Dr Delton Coombes in November 2014 or sooner if you have any problems.

## 2012-09-02 NOTE — Assessment & Plan Note (Signed)
Stable off scheduled BD's for now - continue proventil prn,  - restart scheduled meds if he declines in any way.  - rov November
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
## 2012-11-20 ENCOUNTER — Encounter: Payer: Medicare Other | Admitting: Internal Medicine

## 2012-12-22 ENCOUNTER — Other Ambulatory Visit: Payer: Self-pay | Admitting: Internal Medicine

## 2012-12-22 DIAGNOSIS — Z0000 Encounter for general adult medical examination without abnormal findings: Secondary | ICD-10-CM

## 2012-12-25 ENCOUNTER — Ambulatory Visit (INDEPENDENT_AMBULATORY_CARE_PROVIDER_SITE_OTHER): Payer: Medicare Other | Admitting: Family

## 2012-12-25 ENCOUNTER — Encounter: Payer: Self-pay | Admitting: Family

## 2012-12-25 VITALS — BP 138/70 | HR 67 | Wt 208.0 lb

## 2012-12-25 DIAGNOSIS — H60399 Other infective otitis externa, unspecified ear: Secondary | ICD-10-CM

## 2012-12-25 DIAGNOSIS — H9202 Otalgia, left ear: Secondary | ICD-10-CM

## 2012-12-25 DIAGNOSIS — H9209 Otalgia, unspecified ear: Secondary | ICD-10-CM

## 2012-12-25 DIAGNOSIS — H60392 Other infective otitis externa, left ear: Secondary | ICD-10-CM

## 2012-12-25 MED ORDER — NEOMYCIN-COLIST-HC-THONZONIUM 3.3-3-10-0.5 MG/ML OT SUSP: 3.0000 [drp] | Freq: Four times a day (QID) | OTIC | Status: DC

## 2012-12-25 NOTE — Patient Instructions (Signed)
Otitis Externa Otitis externa is a bacterial or fungal infection of the outer ear canal. This is the area from the eardrum to the outside of the ear. Otitis externa is sometimes called "swimmer's ear." CAUSES  Possible causes of infection include:  Swimming in dirty water.  Moisture remaining in the ear after swimming or bathing.  Mild injury (trauma) to the ear.  Objects stuck in the ear (foreign body).  Cuts or scrapes (abrasions) on the outside of the ear. SYMPTOMS  The first symptom of infection is often itching in the ear canal. Later signs and symptoms may include swelling and redness of the ear canal, ear pain, and yellowish-white fluid (pus) coming from the ear. The ear pain may be worse when pulling on the earlobe. DIAGNOSIS  Your caregiver will perform a physical exam. A sample of fluid may be taken from the ear and examined for bacteria or fungi. TREATMENT  Antibiotic ear drops are often given for 10 to 14 days. Treatment may also include pain medicine or corticosteroids to reduce itching and swelling. PREVENTION   Keep your ear dry. Use the corner of a towel to absorb water out of the ear canal after swimming or bathing.  Avoid scratching or putting objects inside your ear. This can damage the ear canal or remove the protective wax that lines the canal. This makes it easier for bacteria and fungi to grow.  Avoid swimming in lakes, polluted water, or poorly chlorinated pools.  You may use ear drops made of rubbing alcohol and vinegar after swimming. Combine equal parts of white vinegar and alcohol in a bottle. Put 3 or 4 drops into each ear after swimming. HOME CARE INSTRUCTIONS   Apply antibiotic ear drops to the ear canal as prescribed by your caregiver.  Only take over-the-counter or prescription medicines for pain, discomfort, or fever as directed by your caregiver.  If you have diabetes, follow any additional treatment instructions from your caregiver.  Keep all  follow-up appointments as directed by your caregiver. SEEK MEDICAL CARE IF:   You have a fever.  Your ear is still red, swollen, painful, or draining pus after 3 days.  Your redness, swelling, or pain gets worse.  You have a severe headache.  You have redness, swelling, pain, or tenderness in the area behind your ear. MAKE SURE YOU:   Understand these instructions.  Will watch your condition.  Will get help right away if you are not doing well or get worse. Document Released: 04/08/2005 Document Revised: 07/01/2011 Document Reviewed: 04/25/2011 Exit[employer] Patient Information 2014 [employer].  

## 2012-12-25 NOTE — Progress Notes (Signed)
Subjective:    Patient ID: Arthur Harper, male    DOB: 12/26/1945, 67 y.o.   MRN: [id number]  HPI 67 year old white male, nonsmoker, patient of Dr. Lovell Sheehan is in today with complaints of left ear pain x2 weeks and worsening over the weekend. Denies any sneezing, cough or congestion. Reports he has been swimming in a lake over the weekend.  Review of Systems  Constitutional: Negative.   HENT: Positive for ear pain. Negative for congestion, rhinorrhea, sneezing and ear discharge.   Respiratory: Negative.   Cardiovascular: Negative.   Skin: Negative.   Allergic/Immunologic: Negative.   Neurological: Negative.   Hematological: Negative.   Psychiatric/Behavioral: Negative.    Past Medical History  Diagnosis Date  . COPD (chronic obstructive pulmonary disease)   . PTSD (post-traumatic stress disorder)   . Sleep apnea   . Agent orange exposure     History   Social History  . Marital Status: Divorced    Spouse Name: N/A    Number of Children: N/A  . Years of Education: N/A   Occupational History  . retired     Aeronautical engineer   Social History Main Topics  . Smoking status: Former Smoker -- 1.00 packs/day for 40 years    Types: Cigarettes    Quit date: 01/01/2012  . Smokeless tobacco: Never Used     Comment: 10 cigs per day  . Alcohol Use: Yes     Comment: occassional  . Drug Use: No  . Sexual Activity: Not on file   Other Topics Concern  . Not on file   Social History Narrative  . No narrative on file    Past Surgical History  Procedure Laterality Date  . Tonsillectomy    . Fx ribs      Family History  Problem Relation Age of Onset  . Emphysema Mother   . Emphysema Father     No Known Allergies  Current Outpatient Prescriptions on File Prior to Visit  Medication Sig Dispense Refill  . albuterol (PROVENTIL HFA;VENTOLIN HFA) 108 (90 BASE) MCG/ACT inhaler Inhale 2 puffs into the lungs every 6 (six) hours as needed.      Marland Kitchen aspirin 81 MG chewable  tablet Chew 81 mg by mouth daily.      . Multiple Vitamin (MULTIVITAMIN) capsule Take 1 capsule by mouth daily.       No current facility-administered medications on file prior to visit.    BP 138/70  Pulse 67  Wt 208 lb (94.348 kg)  BMI 32.57 kg/m2chart    Objective:   Physical Exam  Constitutional: He is oriented to person, place, and time. He appears well-developed and well-nourished.  HENT:  Right Ear: External ear normal.  Nose: Nose normal.  Mouth/Throat: Oropharynx is clear and moist.  Left tragal tenderness to palpation. External auditory canal red, swollen and very tender. Tympanic membranes normal  Neck: Normal range of motion. Neck supple.  Cardiovascular: Normal rate, regular rhythm and normal heart sounds.   Pulmonary/Chest: Effort normal and breath sounds normal.  Neurological: He is alert and oriented to person, place, and time.  Skin: Skin is warm and dry.  Psychiatric: He has a normal mood and affect.          Assessment & Plan:  Assessment: 1. Acute otitis externa left ear 2. Otalgia  Plan: Cortisporin otic 3 drops in the left ear every 6 hours. Ibuprofen 600 mg 3 times a day as needed with food. Patient probably off if  the symptoms worsen or persist. Recheck as scheduled, and if needed.

## 2012-12-30 ENCOUNTER — Encounter: Payer: Self-pay | Admitting: Gastroenterology

## 2013-01-06 ENCOUNTER — Ambulatory Visit (AMBULATORY_SURGERY_CENTER): Payer: Self-pay | Admitting: *Deleted

## 2013-01-06 VITALS — Ht 69.0 in | Wt 209.4 lb

## 2013-01-06 DIAGNOSIS — Z86010 Personal history of colonic polyps: Secondary | ICD-10-CM

## 2013-01-06 MED ORDER — MOVIPREP 100 G PO SOLR: 1.0000 | Freq: Once | ORAL | Status: DC

## 2013-01-06 NOTE — Progress Notes (Signed)
No egg or soy allergy. No anesthesia problems.  pt has been having blood in his urine and is having a cystoscopy on 9/23. [facility], so to be safe changed pt prep from suprep to movi prep-adm

## 2013-01-07 ENCOUNTER — Encounter: Payer: Self-pay | Admitting: Gastroenterology

## 2013-01-20 ENCOUNTER — Encounter: Payer: Self-pay | Admitting: Gastroenterology

## 2013-01-20 ENCOUNTER — Ambulatory Visit (AMBULATORY_SURGERY_CENTER): Payer: Medicare Other | Admitting: Gastroenterology

## 2013-01-20 VITALS — BP 114/46 | HR 61 | Temp 97.0°F | Resp 13 | Ht 69.0 in | Wt 209.0 lb

## 2013-01-20 DIAGNOSIS — D126 Benign neoplasm of colon, unspecified: Secondary | ICD-10-CM

## 2013-01-20 DIAGNOSIS — Z86010 Personal history of colonic polyps: Secondary | ICD-10-CM

## 2013-01-20 MED ORDER — SODIUM CHLORIDE 0.9 % IV SOLN: 500.0000 mL | INTRAVENOUS | Status: DC

## 2013-01-20 NOTE — Progress Notes (Signed)
Patient did not experience any of the following events: a burn prior to discharge; a fall within the facility; wrong site/side/patient/procedure/implant event; or a hospital transfer or hospital admission upon discharge from the facility. (G8907) Patient did not have preoperative order for IV antibiotic SSI prophylaxis. (G8918)  

## 2013-01-20 NOTE — Patient Instructions (Addendum)

## 2013-01-20 NOTE — Op Note (Signed)
[facility] [address] [place], [address]   COLONOSCOPY PROCEDURE REPORT  PATIENT: [name], [name]  MR#: [id number] BIRTHDATE: [birth year] , 66  yrs. old GENDER: Male ENDOSCOPIST: Louis Meckel, MD REFERRED BY: PROCEDURE DATE:  01/20/2013 PROCEDURE:   Colonoscopy with cold biopsy polypectomy First Screening Colonoscopy - Avg.  risk and is 50 yrs.  old or older - No.  Prior Negative Screening - Now for repeat screening. N/A  History of Adenoma - Now for follow-up colonoscopy & has been > or = to 3 yrs.  Yes hx of adenoma.  Has been 3 or more years since last colonoscopy.  Polyps Removed Today? Yes. ASA CLASS:   Class II INDICATIONS: MEDICATIONS: MAC sedation, administered by CRNA and propofol (Diprivan) 200mg  IV  DESCRIPTION OF PROCEDURE:   After the risks benefits and alternatives of the procedure were thoroughly explained, informed consent was obtained.  A digital rectal exam revealed no abnormalities of the rectum.   The     endoscope was introduced through the anus and advanced to the cecum, which was identified by both the appendix and ileocecal valve. No adverse events experienced.   The quality of the prep was excellent using Suprep The instrument was then slowly withdrawn as the colon was fully examined.      COLON FINDINGS: A sessile polyp measuring 2 mm in size was found in the rectum.  A polypectomy was performed with cold forceps.   The colon mucosa was otherwise normal.  Retroflexed views revealed no abnormalities. The time to cecum=2 minutes 18 seconds.  Withdrawal time=10 minutes 15 seconds.  The scope was withdrawn and the procedure completed. COMPLICATIONS: There were no complications.  ENDOSCOPIC IMPRESSION: 1.   Sessile polyp measuring 2 mm in size was found in the rectum; polypectomy was performed with cold forceps 2.   The colon mucosa was otherwise normal  RECOMMENDATIONS: If the polyp(s) removed today are proven to be  adenomatous (pre-cancerous) polyps, you will need a repeat colonoscopy in 5 years.  Otherwise you should continue to follow colorectal cancer screening guidelines for "routine risk" patients with colonoscopy in 10 years.  You will receive a letter within 1-2 weeks with the results of your biopsy as well as final recommendations.  Please call my office if you have not received a letter after 3 weeks.   eSigned:  Louis Meckel, MD 01/20/2013 2:52 PM   cc: Stacie Glaze, MD and Bernadette Hoit, MD   PATIENT NAME:  Arthur Harper, Arthur Harper MR#: [id number]

## 2013-01-20 NOTE — Progress Notes (Signed)
Called to room to assist during endoscopic procedure.  Patient ID and intended procedure confirmed with present staff. Received instructions for my participation in the procedure from the performing physician.  

## 2013-01-20 NOTE — Progress Notes (Signed)
Lidocaine-40mg IV prior to Propofol InductionPropofol given over incremental dosages 

## 2013-01-21 ENCOUNTER — Telehealth: Payer: Self-pay | Admitting: *Deleted

## 2013-01-21 NOTE — Telephone Encounter (Signed)
Left message that we called for f/u 

## 2013-01-26 ENCOUNTER — Encounter: Payer: Self-pay | Admitting: Gastroenterology

## 2014-07-21 ENCOUNTER — Telehealth: Payer: Self-pay

## 2014-07-21 NOTE — Telephone Encounter (Signed)
07/21/14 Disc received from [facility] and filed on shelf.Britt Bottom

## 2016-03-29 ENCOUNTER — Telehealth (HOSPITAL_COMMUNITY): Payer: Self-pay

## 2016-03-29 NOTE — Telephone Encounter (Signed)
Called patient in regards to Pulmonary Rehab referral from Dr. Gwenette Greet at the [facility]. Patient was in the DR.'s office and stated he would call me back at a later time.

## 2016-04-26 ENCOUNTER — Encounter (HOSPITAL_COMMUNITY)
Admission: RE | Admit: 2016-04-26 | Discharge: 2016-04-26 | Disposition: A | Discharge status: Home / Self Care | Payer: Non-veteran care | Source: Ambulatory Visit | Attending: Pulmonary Disease | Admitting: Pulmonary Disease

## 2016-04-26 ENCOUNTER — Encounter (HOSPITAL_COMMUNITY): Payer: Self-pay

## 2016-04-26 VITALS — BP 134/72 | HR 62 | Resp 18 | Ht 67.0 in | Wt 222.4 lb

## 2016-04-26 DIAGNOSIS — J439 Emphysema, unspecified: Secondary | ICD-10-CM | POA: Diagnosis not present

## 2016-04-26 NOTE — Progress Notes (Signed)
Arthur Harper 71 y.o. male Pulmonary Rehab Orientation Note Patient arrived today in Cardiac and Pulmonary Rehab for orientation to Pulmonary Rehab. He ambulated from [name] parking with his significant other with little difficulty. He has not been prescribed home or portable O2. He is compliant with his nightly CPAP use for OSA. Color good, skin warm and dry. Patient is oriented to time and place. Patient's medical history, psychosocial health, and medications reviewed. Psychosocial assessment reveals pt lives with their partner. Pt is currently retired. Pt hobbies include traveling, boating, yard work and working in his workshop. Pt reports his stress level is low. Pt does not exhibit signs of depression. He has a diagnosis of PTSD/depression however he states his hobbies and social activities help him manage any symptoms that may arise. PHQ2/9 score 1/na. Pt shows good  coping skills with positive outlook. He is offered emotional support and reassurance. Will continue to monitor and evaluate progress toward psychosocial goal(s) of continuing to manage his PTSD without medications. Physical assessment reveals heart rate is normal, breath sounds clear to auscultation, no wheezes, rales, or rhonchi; slightly diminished in bases bilat. Grip strength equal, strong. Distal pulses palpable. No edema noted. Patient reports he does take medications as prescribed. Patient states he follows a Regular diet. The patient reports no specific efforts to gain or lose weight. Patient's weight will be monitored closely. Demonstration and practice of PLB using pulse oximeter. Patient able to return demonstration satisfactorily. Safety and hand hygiene in the exercise area reviewed with patient. Patient voices understanding of the information reviewed. Department expectations discussed with patient and achievable goals were set. The patient shows enthusiasm about attending the program and we look forward to working with this  nice gentleman. The patient is scheduled for a 6 min walk test on 04/30/16 and to begin exercise on 05/07/16 at 1030.   45 minutes was spent on a variety of activities such as assessment of the patient, obtaining baseline data including height, weight, BMI, and grip strength, verifying medical history, allergies, and current medications, and teaching patient strategies for performing tasks with less respiratory effort with emphasis on pursed lip breathing.

## 2016-04-30 ENCOUNTER — Encounter (HOSPITAL_COMMUNITY)
Admission: RE | Admit: 2016-04-30 | Discharge: 2016-04-30 | Disposition: A | Discharge status: Home / Self Care | Payer: Non-veteran care | Source: Ambulatory Visit | Attending: Pulmonary Disease | Admitting: Pulmonary Disease

## 2016-04-30 DIAGNOSIS — J439 Emphysema, unspecified: Secondary | ICD-10-CM

## 2016-04-30 NOTE — Progress Notes (Signed)
Pulmonary Individual Treatment Plan  Patient Details  Name: TYRA GAITHER MRN: [id number] Date of Birth: 1945/11/30 Referring Provider:   April Manson Pulmonary Rehab Walk Test from 04/30/2016 in [place]  Referring Provider  Dr. Gwenette Greet      Initial Encounter Date:  Flowsheet Row Pulmonary Rehab Walk Test from 04/30/2016 in [place]  Date  04/30/16  Referring Provider  Dr. Gwenette Greet      Visit Diagnosis: Pulmonary emphysema, unspecified emphysema type ([place])  Patient's Home Medications on Admission:   Current Outpatient Prescriptions:  .  albuterol (PROVENTIL HFA;VENTOLIN HFA) 108 (90 BASE) MCG/ACT inhaler, Inhale 2 puffs into the lungs every 6 (six) hours as needed., Disp: , Rfl:  .  aspirin 81 MG chewable tablet, Chew 81 mg by mouth daily., Disp: , Rfl:  .  budesonide-formoterol (SYMBICORT) 160-4.5 MCG/ACT inhaler, Inhale 2 puffs into the lungs 2 (two) times daily., Disp: , Rfl:  .  Multiple Vitamin (MULTIVITAMIN) capsule, Take 1 capsule by mouth daily., Disp: , Rfl:  .  terazosin (HYTRIN) 5 MG capsule, Take 5 mg by mouth at bedtime., Disp: , Rfl:  .  tiotropium (SPIRIVA) 18 MCG inhalation capsule, Place 18 mcg into inhaler and inhale daily., Disp: , Rfl:   Past Medical History: Past Medical History:  Diagnosis Date  . Agent orange exposure   . COPD (chronic obstructive pulmonary disease) ([place])   . PTSD (post-traumatic stress disorder)   . Sleep apnea     Tobacco Use: History  Smoking Status  . Former Smoker  . Packs/day: 1.00  . Years: 40.00  . Types: Cigarettes  . Quit date: 01/01/2012  Smokeless Tobacco  . Never Used    Comment: 10 cigs per day    Labs: Recent Review Flowsheet Data    Labs for ITP Cardiac and Pulmonary Rehab Latest Ref Rng & Units 12/25/2007 08/14/2012   Cholestrol 0 - 200 mg/dL 171 165   LDLCALC 0 - 99 mg/dL 105(H) 98   HDL >39.00 mg/dL 55.4 54.10   Trlycerides 0.0 - 149.0  mg/dL 52 65.0      Capillary Blood Glucose: No results found for: GLUCAP   ADL UCSD:   Pulmonary Function Assessment:     Pulmonary Function Assessment - 04/26/16 1314      Breath   Bilateral Breath Sounds Decreased  bases, clear uppers   Shortness of Breath Yes  mild shortness of breath sometimes limits strenuous activity      Exercise Target Goals: Date: 04/30/16  Exercise Program Goal: Individual exercise prescription set with THRR, safety & activity barriers. Participant demonstrates ability to understand and report RPE using BORG scale, to self-measure pulse accurately, and to acknowledge the importance of the exercise prescription.  Exercise Prescription Goal: Starting with aerobic activity 30 plus minutes a day, 3 days per week for initial exercise prescription. Provide home exercise prescription and guidelines that participant acknowledges understanding prior to discharge.  Activity Barriers & Risk Stratification:     Activity Barriers & Cardiac Risk Stratification - 04/26/16 1313      Activity Barriers & Cardiac Risk Stratification   Activity Barriers Deconditioning;Shortness of Breath      6 Minute Walk:     6 Minute Walk    Row Name 04/30/16 1544         6 Minute Walk   Phase Initial     Distance 1515 feet     Walk Time 6 minutes     #  of Rest Breaks 0     MPH 2.86     METS 3.14     RPE 12     Perceived Dyspnea  1     Symptoms No     Resting HR 79 bpm     Resting BP 152/84     Max Ex. HR 94 bpm     Max Ex. BP 152/74       Interval HR   Baseline HR 79     1 Minute HR 86     2 Minute HR 97     3 Minute HR 91     4 Minute HR 94     5 Minute HR 93     6 Minute HR 93     2 Minute Post HR 78     Interval Heart Rate? Yes       Interval Oxygen   Interval Oxygen? Yes     Baseline Oxygen Saturation % 97 %     Baseline Liters of Oxygen 0 L     1 Minute Oxygen Saturation % 95 %     1 Minute Liters of Oxygen 0 L     2 Minute Oxygen  Saturation % 94 %     2 Minute Liters of Oxygen 0 L     3 Minute Oxygen Saturation % 94 %     3 Minute Liters of Oxygen 0 L     4 Minute Oxygen Saturation % 94 %     4 Minute Liters of Oxygen 0 L     5 Minute Oxygen Saturation % 95 %     5 Minute Liters of Oxygen 0 L     6 Minute Oxygen Saturation % 95 %     6 Minute Liters of Oxygen 0 L     2 Minute Post Oxygen Saturation % 97 %     2 Minute Post Liters of Oxygen 0 L        Initial Exercise Prescription:     Initial Exercise Prescription - 04/30/16 1500      Date of Initial Exercise RX and Referring Provider   Date 04/30/16   Referring Provider Dr. Gwenette Greet     Bike   Level 0.7   Minutes 17     NuStep   Level 2   Minutes 17   METs 1.5     Track   Laps 10   Minutes 17     Prescription Details   Frequency (times per week) 2   Duration Progress to 45 minutes of aerobic exercise without signs/symptoms of physical distress     Intensity   THRR 40-80% of Max Heartrate 60-120   Ratings of Perceived Exertion 11-13   Perceived Dyspnea 0-4     Progression   Progression Continue progressive overload as per policy without signs/symptoms or physical distress.     Resistance Training   Training Prescription Yes   Weight blue bands   Reps 10-12      Perform Capillary Blood Glucose checks as needed.  Exercise Prescription Changes:   Exercise Comments:   Discharge Exercise Prescription (Final Exercise Prescription Changes):    Nutrition:  Target Goals: Understanding of nutrition guidelines, daily intake of sodium 1500mg , cholesterol 200mg , calories 30% from fat and 7% or less from saturated fats, daily to have 5 or more servings of fruits and vegetables.  Biometrics:     Pre Biometrics - 04/26/16 1405      Pre  Biometrics   Grip Strength 48 kg       Nutrition Therapy Plan and Nutrition Goals:   Nutrition Discharge: Rate Your Plate Scores:   Psychosocial: Target Goals: Acknowledge presence or  absence of depression, maximize coping skills, provide positive support system. Participant is able to verbalize types and ability to use techniques and skills needed for reducing stress and depression.  Initial Review & Psychosocial Screening:     Initial Psych Review & Screening - 04/26/16 [place]? No     Barriers   Psychosocial barriers to participate in program There are no identifiable barriers or psychosocial needs.     Screening Interventions   Interventions Encouraged to exercise      Quality of Life Scores:   PHQ-9: Recent Review Flowsheet Data    Depression screen PHQ 2/9 04/26/2016   Decreased Interest 1   Down, Depressed, Hopeless 0   PHQ - 2 Score 1      Psychosocial Evaluation and Intervention:     Psychosocial Evaluation - 04/26/16 1323      Psychosocial Evaluation & Interventions   Interventions Encouraged to exercise with the program and follow exercise prescription      Psychosocial Re-Evaluation:  Education: Education Goals: Education classes will be provided on a weekly basis, covering required topics. Participant will state understanding/return demonstration of topics presented.  Learning Barriers/Preferences:     Learning Barriers/Preferences - 04/26/16 1314      Learning Barriers/Preferences   Learning Barriers None   Learning Preferences Computer/Internet;Group Instruction;Individual Instruction;Skilled Demonstration;Written Material      Education Topics: Risk Factor Reduction:  -Group instruction that is supported by a PowerPoint presentation. Instructor discusses the definition of a risk factor, different risk factors for pulmonary disease, and how the heart and lungs work together.     Nutrition for Pulmonary Patient:  -Group instruction provided by PowerPoint slides, verbal discussion, and written materials to support subject matter. The instructor gives an explanation and review of  healthy diet recommendations, which includes a discussion on weight management, recommendations for fruit and vegetable consumption, as well as protein, fluid, caffeine, fiber, sodium, sugar, and alcohol. Tips for eating when patients are short of breath are discussed.   Pursed Lip Breathing:  -Group instruction that is supported by demonstration and informational handouts. Instructor discusses the benefits of pursed lip and diaphragmatic breathing and detailed demonstration on how to preform both.     Oxygen Safety:  -Group instruction provided by PowerPoint, verbal discussion, and written material to support subject matter. There is an overview of "What is Oxygen" and "Why do we need it".  Instructor also reviews how to create a safe environment for oxygen use, the importance of using oxygen as prescribed, and the risks of noncompliance. There is a brief discussion on traveling with oxygen and resources the patient may utilize.   Oxygen Equipment:  -Group instruction provided by [facility] Staff utilizing handouts, written materials, and equipment demonstrations.   Signs and Symptoms:  -Group instruction provided by written material and verbal discussion to support subject matter. Warning signs and symptoms of infection, stroke, and heart attack are reviewed and when to call the physician/911 reinforced. Tips for preventing the spread of infection discussed.   Advanced Directives:  -Group instruction provided by verbal instruction and written material to support subject matter. Instructor reviews Advanced Directive laws and proper instruction for filling out document.   Pulmonary Video:  -Group video education  that reviews the importance of medication and oxygen compliance, exercise, good nutrition, pulmonary hygiene, and pursed lip and diaphragmatic breathing for the pulmonary patient.   Exercise for the Pulmonary Patient:  -Group instruction that is supported by a PowerPoint  presentation. Instructor discusses benefits of exercise, core components of exercise, frequency, duration, and intensity of an exercise routine, importance of utilizing pulse oximetry during exercise, safety while exercising, and options of places to exercise outside of rehab.     Pulmonary Medications:  -Verbally interactive group education provided by instructor with focus on inhaled medications and proper administration.   Anatomy and Physiology of the Respiratory System and Intimacy:  -Group instruction provided by PowerPoint, verbal discussion, and written material to support subject matter. Instructor reviews respiratory cycle and anatomical components of the respiratory system and their functions. Instructor also reviews differences in obstructive and restrictive respiratory diseases with examples of each. Intimacy, Sex, and Sexuality differences are reviewed with a discussion on how relationships can change when diagnosed with pulmonary disease. Common sexual concerns are reviewed.   Knowledge Questionnaire Score:   Core Components/Risk Factors/Patient Goals at Admission:     Personal Goals and Risk Factors at Admission - 04/26/16 1321      Core Components/Risk Factors/Patient Goals on Admission    Weight Management Yes;Obesity   Intervention Obesity: Provide education and appropriate resources to help participant work on and attain dietary goals.;Weight Management/Obesity: Establish reasonable short term and long term weight goals.   Expected Outcomes Short Term: Continue to assess and modify interventions until short term weight is achieved;Understanding of distribution of calorie intake throughout the day with the consumption of 4-5 meals/snacks;Weight Loss: Understanding of general recommendations for a balanced deficit meal plan, which promotes 1-2 lb weight loss per week and includes a negative energy balance of [phone] kcal/d;Understanding recommendations for meals to include  15-35% energy as protein, 25-35% energy from fat, 35-60% energy from carbohydrates, less than 200mg  of dietary cholesterol, 20-35 gm of total fiber daily   Improve shortness of breath with ADL's Yes   Intervention Provide education, individualized exercise plan and daily activity instruction to help decrease symptoms of SOB with activities of daily living.   Expected Outcomes Short Term: Achieves a reduction of symptoms when performing activities of daily living.   Develop more efficient breathing techniques such as purse lipped breathing and diaphragmatic breathing; and practicing self-pacing with activity Yes   Intervention Provide education, demonstration and support about specific breathing techniuqes utilized for more efficient breathing. Include techniques such as pursed lipped breathing, diaphragmatic breathing and self-pacing activity.   Expected Outcomes Short Term: Participant will be able to demonstrate and use breathing techniques as needed throughout daily activities.      Core Components/Risk Factors/Patient Goals Review:    Core Components/Risk Factors/Patient Goals at Discharge (Final Review):    ITP Comments:   Comments:

## 2016-05-02 ENCOUNTER — Encounter (HOSPITAL_COMMUNITY): Payer: Self-pay | Admitting: *Deleted

## 2016-05-07 ENCOUNTER — Encounter (HOSPITAL_COMMUNITY)
Admission: RE | Admit: 2016-05-07 | Discharge: 2016-05-07 | Disposition: A | Discharge status: Home / Self Care | Payer: Non-veteran care | Source: Ambulatory Visit | Attending: Pulmonary Disease | Admitting: Pulmonary Disease

## 2016-05-07 VITALS — Wt 224.2 lb

## 2016-05-07 DIAGNOSIS — J439 Emphysema, unspecified: Secondary | ICD-10-CM

## 2016-05-07 NOTE — Progress Notes (Signed)
Daily Session Note  Patient Details  Name: Arthur Harper MRN: [id number] Date of Birth: 01/17/1946 Referring Provider:   April Manson Pulmonary Rehab Walk Test from 04/30/2016 in [place]  Referring Provider  Dr. Gwenette Greet      Encounter Date: 05/07/2016  Check In:     Session Check In - 05/07/16 1029      Check-In   Location [facility]   Staff Present Rosebud Poles, RN, BSN;Molly diVincenzo, MS, ACSM RCEP, Exercise Physiologist;Lisa Ysidro Evert, RN;Portia Rollene Rotunda, RN, BSN   Supervising physician immediately available to respond to emergencies [facility] immediately available   Physician(s) Dr. Eliseo Squires   Medication changes reported     No   Fall or balance concerns reported    No   Warm-up and Cool-down Performed as group-led instruction   Resistance Training Performed Yes   VAD Patient? No     Pain Assessment   Currently in Pain? No/denies   Multiple Pain Sites No      Capillary Blood Glucose: No results found for this or any previous visit (from the past 24 hour(s)).      Exercise Prescription Changes - 05/07/16 1200      Response to Exercise   Blood Pressure (Admit) 108/68   Blood Pressure (Exercise) 144/80   Blood Pressure (Exit) 100/60   Heart Rate (Admit) 66 bpm   Heart Rate (Exercise) 81 bpm   Heart Rate (Exit) 72 bpm   Oxygen Saturation (Admit) 98 %   Oxygen Saturation (Exercise) 96 %   Oxygen Saturation (Exit) 96 %   Rating of Perceived Exertion (Exercise) 12   Perceived Dyspnea (Exercise) 1   Duration Progress to 45 minutes of aerobic exercise without signs/symptoms of physical distress   Intensity --  40-80% HRR     Resistance Training   Training Prescription Yes   Weight blue bands   Reps 10-12  10 minutes of strength training     Interval Training   Interval Training No     Bike   Level 0.7   Minutes 17     NuStep   Level 2   Minutes 17   METs 1.8     Track   Laps 18   Minutes  17     Goals Met:  Exercise tolerated well Strength training completed today  Goals Unmet:  Not Applicable  Comments: Service time is from 1030 to 1200    Dr. Rush Farmer is Medical Director for Pulmonary Rehab at [facility].

## 2016-05-09 ENCOUNTER — Encounter (HOSPITAL_COMMUNITY): Admission: RE | Admit: 2016-05-09 | Payer: Non-veteran care | Source: Ambulatory Visit

## 2016-05-14 ENCOUNTER — Encounter (HOSPITAL_COMMUNITY)
Admission: RE | Admit: 2016-05-14 | Discharge: 2016-05-14 | Disposition: A | Discharge status: Home / Self Care | Payer: Non-veteran care | Source: Ambulatory Visit | Attending: Pulmonary Disease | Admitting: Pulmonary Disease

## 2016-05-14 VITALS — Wt 224.2 lb

## 2016-05-14 DIAGNOSIS — J439 Emphysema, unspecified: Secondary | ICD-10-CM

## 2016-05-14 NOTE — Progress Notes (Signed)
Daily Session Note  Patient Details  Name: Arthur Harper MRN: [id number] Date of Birth: Oct 18, 1945 Referring Provider:   April Manson Pulmonary Rehab Walk Test from 04/30/2016 in [place]  Referring Provider  Dr. Gwenette Greet      Encounter Date: 05/14/2016  Check In:     Session Check In - 05/14/16 1045      Check-In   Location [facility]   Staff Present Rosebud Poles, RN, BSN;Kord Monette, MS, ACSM RCEP, Exercise Physiologist;Lisa Ysidro Evert, RN;Portia Rollene Rotunda, RN, BSN   Supervising physician immediately available to respond to emergencies [facility] immediately available   Physician(s) Dr. Wyline Copas   Medication changes reported     No   Fall or balance concerns reported    No   Warm-up and Cool-down Performed as group-led instruction   Resistance Training Performed Yes   VAD Patient? No     Pain Assessment   Currently in Pain? No/denies   Multiple Pain Sites No      Capillary Blood Glucose: No results found for this or any previous visit (from the past 24 hour(s)).      Exercise Prescription Changes - 05/14/16 1200      Exercise Review   Progression Yes     Response to Exercise   Blood Pressure (Admit) 122/64   Blood Pressure (Exercise) 130/76   Blood Pressure (Exit) 100/62   Heart Rate (Admit) 65 bpm   Heart Rate (Exercise) 85 bpm   Heart Rate (Exit) 75 bpm   Oxygen Saturation (Admit) 95 %   Oxygen Saturation (Exercise) 94 %   Oxygen Saturation (Exit) 95 %   Rating of Perceived Exertion (Exercise) 12   Perceived Dyspnea (Exercise) 1   Duration Progress to 45 minutes of aerobic exercise without signs/symptoms of physical distress   Intensity --  40-80% HRR     Progression   Progression Continue progressive overload as per policy without signs/symptoms or physical distress.     Resistance Training   Training Prescription Yes   Weight blue bands   Reps 10-12  10 minutes of strength training     Interval Training   Interval Training No     Bike   Level 0.7   Minutes 17     NuStep   Level 4   Minutes 17   METs 2.1     Track   Laps 20   Minutes 17     Goals Met:  Exercise tolerated well No report of cardiac concerns or symptoms Strength training completed today  Goals Unmet:  Not Applicable  Comments: Service time is from 10:30am to 12:00p    Dr. Rush Farmer is Medical Director for Pulmonary Rehab at [facility].

## 2016-05-14 NOTE — Progress Notes (Signed)
Pulmonary Individual Treatment Plan  Patient Details  Name: Arthur Harper MRN: [id number] Date of Birth: Nov 25, 1945 Referring Provider:   April Manson Pulmonary Rehab Walk Test from 04/30/2016 in [place]  Referring Provider  Dr. Gwenette Greet      Initial Encounter Date:  Flowsheet Row Pulmonary Rehab Walk Test from 04/30/2016 in [place]  Date  04/30/16  Referring Provider  Dr. Gwenette Greet      Visit Diagnosis: Pulmonary emphysema, unspecified emphysema type ([place])  Patient's Home Medications on Admission:   Current Outpatient Prescriptions:  .  albuterol (PROVENTIL HFA;VENTOLIN HFA) 108 (90 BASE) MCG/ACT inhaler, Inhale 2 puffs into the lungs every 6 (six) hours as needed., Disp: , Rfl:  .  aspirin 81 MG chewable tablet, Chew 81 mg by mouth daily., Disp: , Rfl:  .  budesonide-formoterol (SYMBICORT) 160-4.5 MCG/ACT inhaler, Inhale 2 puffs into the lungs 2 (two) times daily., Disp: , Rfl:  .  Multiple Vitamin (MULTIVITAMIN) capsule, Take 1 capsule by mouth daily., Disp: , Rfl:  .  terazosin (HYTRIN) 5 MG capsule, Take 5 mg by mouth at bedtime., Disp: , Rfl:  .  tiotropium (SPIRIVA) 18 MCG inhalation capsule, Place 18 mcg into inhaler and inhale daily., Disp: , Rfl:   Past Medical History: Past Medical History:  Diagnosis Date  . Agent orange exposure   . COPD (chronic obstructive pulmonary disease) ([place])   . PTSD (post-traumatic stress disorder)   . Sleep apnea     Tobacco Use: History  Smoking Status  . Former Smoker  . Packs/day: 1.00  . Years: 40.00  . Types: Cigarettes  . Quit date: 01/01/2012  Smokeless Tobacco  . Never Used    Comment: 10 cigs per day    Labs: Recent Review Flowsheet Data    Labs for ITP Cardiac and Pulmonary Rehab Latest Ref Rng & Units 12/25/2007 08/14/2012   Cholestrol 0 - 200 mg/dL 171 165   LDLCALC 0 - 99 mg/dL 105(H) 98   HDL >39.00 mg/dL 55.4 54.10   Trlycerides 0.0 - 149.0  mg/dL 52 65.0      Capillary Blood Glucose: No results found for: GLUCAP   ADL UCSD:     Pulmonary Assessment Scores    Row Name 05/02/16 1709         ADL UCSD   ADL Phase Entry     SOB Score total 64        Pulmonary Function Assessment:     Pulmonary Function Assessment - 04/26/16 1314      Breath   Bilateral Breath Sounds Decreased  bases, clear uppers   Shortness of Breath Yes  mild shortness of breath sometimes limits strenuous activity      Exercise Target Goals:    Exercise Program Goal: Individual exercise prescription set with THRR, safety & activity barriers. Participant demonstrates ability to understand and report RPE using BORG scale, to self-measure pulse accurately, and to acknowledge the importance of the exercise prescription.  Exercise Prescription Goal: Starting with aerobic activity 30 plus minutes a day, 3 days per week for initial exercise prescription. Provide home exercise prescription and guidelines that participant acknowledges understanding prior to discharge.  Activity Barriers & Risk Stratification:     Activity Barriers & Cardiac Risk Stratification - 04/26/16 1313      Activity Barriers & Cardiac Risk Stratification   Activity Barriers Deconditioning;Shortness of Breath      6 Minute Walk:     6  Minute Walk    Row Name 04/30/16 1544         6 Minute Walk   Phase Initial     Distance 1515 feet     Walk Time 6 minutes     # of Rest Breaks 0     MPH 2.86     METS 3.14     RPE 12     Perceived Dyspnea  1     Symptoms No     Resting HR 79 bpm     Resting BP 152/84     Max Ex. HR 94 bpm     Max Ex. BP 152/74       Interval HR   Baseline HR 79     1 Minute HR 86     2 Minute HR 97     3 Minute HR 91     4 Minute HR 94     5 Minute HR 93     6 Minute HR 93     2 Minute Post HR 78     Interval Heart Rate? Yes       Interval Oxygen   Interval Oxygen? Yes     Baseline Oxygen Saturation % 97 %     Baseline  Liters of Oxygen 0 L     1 Minute Oxygen Saturation % 95 %     1 Minute Liters of Oxygen 0 L     2 Minute Oxygen Saturation % 94 %     2 Minute Liters of Oxygen 0 L     3 Minute Oxygen Saturation % 94 %     3 Minute Liters of Oxygen 0 L     4 Minute Oxygen Saturation % 94 %     4 Minute Liters of Oxygen 0 L     5 Minute Oxygen Saturation % 95 %     5 Minute Liters of Oxygen 0 L     6 Minute Oxygen Saturation % 95 %     6 Minute Liters of Oxygen 0 L     2 Minute Post Oxygen Saturation % 97 %     2 Minute Post Liters of Oxygen 0 L        Initial Exercise Prescription:     Initial Exercise Prescription - 04/30/16 1500      Date of Initial Exercise RX and Referring Provider   Date 04/30/16   Referring Provider Dr. Gwenette Greet     Bike   Level 0.7   Minutes 17     NuStep   Level 2   Minutes 17   METs 1.5     Track   Laps 10   Minutes 17     Prescription Details   Frequency (times per week) 2   Duration Progress to 45 minutes of aerobic exercise without signs/symptoms of physical distress     Intensity   THRR 40-80% of Max Heartrate 60-120   Ratings of Perceived Exertion 11-13   Perceived Dyspnea 0-4     Progression   Progression Continue progressive overload as per policy without signs/symptoms or physical distress.     Resistance Training   Training Prescription Yes   Weight blue bands   Reps 10-12      Perform Capillary Blood Glucose checks as needed.  Exercise Prescription Changes:     Exercise Prescription Changes    Row Name 05/07/16 1200  Response to Exercise   Blood Pressure (Admit) 108/68       Blood Pressure (Exercise) 144/80       Blood Pressure (Exit) 100/60       Heart Rate (Admit) 66 bpm       Heart Rate (Exercise) 81 bpm       Heart Rate (Exit) 72 bpm       Oxygen Saturation (Admit) 98 %       Oxygen Saturation (Exercise) 96 %       Oxygen Saturation (Exit) 96 %       Rating of Perceived Exertion (Exercise) 12        Perceived Dyspnea (Exercise) 1       Duration Progress to 45 minutes of aerobic exercise without signs/symptoms of physical distress       Intensity -  40-80% HRR         Resistance Training   Training Prescription Yes       Weight blue bands       Reps 10-12  10 minutes of strength training         Interval Training   Interval Training No         Bike   Level 0.7       Minutes 17         NuStep   Level 2       Minutes 17       METs 1.8         Track   Laps 18       Minutes 17          Exercise Comments:     Exercise Comments    Row Name 05/13/16 1644           Exercise Comments Patient has only attended one exercise session. Will cont. to monitor and progress.           Discharge Exercise Prescription (Final Exercise Prescription Changes):     Exercise Prescription Changes - 05/07/16 1200      Response to Exercise   Blood Pressure (Admit) 108/68   Blood Pressure (Exercise) 144/80   Blood Pressure (Exit) 100/60   Heart Rate (Admit) 66 bpm   Heart Rate (Exercise) 81 bpm   Heart Rate (Exit) 72 bpm   Oxygen Saturation (Admit) 98 %   Oxygen Saturation (Exercise) 96 %   Oxygen Saturation (Exit) 96 %   Rating of Perceived Exertion (Exercise) 12   Perceived Dyspnea (Exercise) 1   Duration Progress to 45 minutes of aerobic exercise without signs/symptoms of physical distress   Intensity --  40-80% HRR     Resistance Training   Training Prescription Yes   Weight blue bands   Reps 10-12  10 minutes of strength training     Interval Training   Interval Training No     Bike   Level 0.7   Minutes 17     NuStep   Level 2   Minutes 17   METs 1.8     Track   Laps 18   Minutes 17      Nutrition:  Target Goals: Understanding of nutrition guidelines, daily intake of sodium '1500mg'$ , cholesterol '200mg'$ , calories 30% from fat and 7% or less from saturated fats, daily to have 5 or more servings of fruits and vegetables.  Biometrics:     Pre  Biometrics - 04/26/16 1405      Pre Biometrics   Grip Strength 48 kg  Nutrition Therapy Plan and Nutrition Goals:   Nutrition Discharge: Rate Your Plate Scores:   Psychosocial: Target Goals: Acknowledge presence or absence of depression, maximize coping skills, provide positive support system. Participant is able to verbalize types and ability to use techniques and skills needed for reducing stress and depression.  Initial Review & Psychosocial Screening:     Initial Psych Review & Screening - 04/26/16 [place]? No     Barriers   Psychosocial barriers to participate in program There are no identifiable barriers or psychosocial needs.     Screening Interventions   Interventions Encouraged to exercise      Quality of Life Scores:     Quality of Life - 05/02/16 1709      Quality of Life Scores   Health/Function Pre 20.7 %   Socioeconomic Pre 25.5 %   Psych/Spiritual Pre 24 %   Family Pre 19.5 %   GLOBAL Pre 22.37 %      PHQ-9: Recent Review Flowsheet Data    Depression screen PHQ 2/9 04/26/2016   Decreased Interest 1   Down, Depressed, Hopeless 0   PHQ - 2 Score 1      Psychosocial Evaluation and Intervention:     Psychosocial Evaluation - 04/26/16 1323      Psychosocial Evaluation & Interventions   Interventions Encouraged to exercise with the program and follow exercise prescription      Psychosocial Re-Evaluation:     Psychosocial Re-Evaluation    [place] Name 05/14/16 0732             Psychosocial Re-Evaluation   Interventions Encouraged to attend Pulmonary Rehabilitation for the exercise       Comments no psychosocial barriers identified since admission          Education: Education Goals: Education classes will be provided on a weekly basis, covering required topics. Participant will state understanding/return demonstration of topics presented.  Learning Barriers/Preferences:     Learning  Barriers/Preferences - 04/26/16 1314      Learning Barriers/Preferences   Learning Barriers None   Learning Preferences Computer/Internet;Group Instruction;Individual Instruction;Skilled Demonstration;Written Material      Education Topics: How Lungs Work and Diseases: - Discuss the anatomy of the lungs and diseases that can affect the lungs, such as COPD.   Exercise: -Discuss the importance of exercise, FITT principles of exercise, normal and abnormal responses to exercise, and how to exercise safely.   Environmental Irritants: -Discuss types of environmental irritants and how to limit exposure to environmental irritants.   Meds/Inhalers and oxygen: - Discuss respiratory medications, definition of an inhaler and oxygen, and the proper way to use an inhaler and oxygen.   Energy Saving Techniques: - Discuss methods to conserve energy and decrease shortness of breath when performing activities of daily living.    Bronchial Hygiene / Breathing Techniques: - Discuss breathing mechanics, pursed-lip breathing technique,  proper posture, effective ways to clear airways, and other functional breathing techniques   Cleaning Equipment: - Provides group verbal and written instruction about the health risks of elevated stress, cause of high stress, and healthy ways to reduce stress.   Nutrition I: Fats: - Discuss the types of cholesterol, what cholesterol does to the body, and how cholesterol levels can be controlled.   Nutrition II: Labels: -Discuss the different components of food labels and how to read food labels.   Respiratory Infections: - Discuss the signs and symptoms of respiratory infections,  ways to prevent respiratory infections, and the importance of seeking medical treatment when having a respiratory infection.   Stress I: Signs and Symptoms: - Discuss the causes of stress, how stress may lead to anxiety and depression, and ways to limit stress.   Stress II:  Relaxation: -Discuss relaxation techniques to limit stress.   Oxygen for Home/Travel: - Discuss how to prepare for travel when on oxygen and proper ways to transport and store oxygen to ensure safety.   Knowledge Questionnaire Score:     Knowledge Questionnaire Score - 05/02/16 1708      Knowledge Questionnaire Score   Pre Score 8/13      Core Components/Risk Factors/Patient Goals at Admission:     Personal Goals and Risk Factors at Admission - 04/26/16 1321      Core Components/Risk Factors/Patient Goals on Admission    Weight Management Yes;Obesity   Intervention Obesity: Provide education and appropriate resources to help participant work on and attain dietary goals.;Weight Management/Obesity: Establish reasonable short term and long term weight goals.   Expected Outcomes Short Term: Continue to assess and modify interventions until short term weight is achieved;Understanding of distribution of calorie intake throughout the day with the consumption of 4-5 meals/snacks;Weight Loss: Understanding of general recommendations for a balanced deficit meal plan, which promotes 1-2 lb weight loss per week and includes a negative energy balance of [phone] kcal/d;Understanding recommendations for meals to include 15-35% energy as protein, 25-35% energy from fat, 35-60% energy from carbohydrates, less than '200mg'$  of dietary cholesterol, 20-35 gm of total fiber daily   Improve shortness of breath with ADL's Yes   Intervention Provide education, individualized exercise plan and daily activity instruction to help decrease symptoms of SOB with activities of daily living.   Expected Outcomes Short Term: Achieves a reduction of symptoms when performing activities of daily living.   Develop more efficient breathing techniques such as purse lipped breathing and diaphragmatic breathing; and practicing self-pacing with activity Yes   Intervention Provide education, demonstration and support about  specific breathing techniuqes utilized for more efficient breathing. Include techniques such as pursed lipped breathing, diaphragmatic breathing and self-pacing activity.   Expected Outcomes Short Term: Participant will be able to demonstrate and use breathing techniques as needed throughout daily activities.      Core Components/Risk Factors/Patient Goals Review:      Goals and Risk Factor Review    Row Name 05/14/16 0731             Core Components/Risk Factors/Patient Goals Review   Personal Goals Review Weight Management/Obesity;Improve shortness of breath with ADL's;Increase Strength and Stamina       Review see comments section on ITP       Expected Outcomes See admission expected outcomes          Core Components/Risk Factors/Patient Goals at Discharge (Final Review):      Goals and Risk Factor Review - 05/14/16 0731      Core Components/Risk Factors/Patient Goals Review   Personal Goals Review Weight Management/Obesity;Improve shortness of breath with ADL's;Increase Strength and Stamina   Review see comments section on ITP   Expected Outcomes See admission expected outcomes      ITP Comments:   Comments: Patient has only attended his first exercise session since admission and tolerated his exercise extremely well. It is anticipated that patient will make expected progress towards his pulmonary rehab goals over the next 30 days. Will follow up at that time.

## 2016-05-16 ENCOUNTER — Encounter (HOSPITAL_COMMUNITY)
Admission: RE | Admit: 2016-05-16 | Discharge: 2016-05-16 | Disposition: A | Discharge status: Home / Self Care | Payer: Non-veteran care | Source: Ambulatory Visit | Attending: Pulmonary Disease | Admitting: Pulmonary Disease

## 2016-05-16 VITALS — Wt 221.3 lb

## 2016-05-16 DIAGNOSIS — J439 Emphysema, unspecified: Secondary | ICD-10-CM

## 2016-05-16 NOTE — Progress Notes (Signed)
Daily Session Note  Patient Details  Name: Arthur Harper MRN: [id number] Date of Birth: 1945-09-23 Referring Provider:   April Manson Pulmonary Rehab Walk Test from 04/30/2016 in [place]  Referring Provider  Dr. Gwenette Greet      Encounter Date: 05/16/2016  Check In:     Session Check In - 05/16/16 1030      Check-In   Location [facility]   Staff Present Trish Fountain, RN, BSN;Molly diVincenzo, MS, ACSM RCEP, Exercise Physiologist;Lisa Ysidro Evert, Felipe Drone, RN, [place] physician immediately available to respond to emergencies [facility] immediately available   Physician(s) Dr. Algis Liming   Medication changes reported     No   Fall or balance concerns reported    No   Warm-up and Cool-down Performed as group-led instruction   Resistance Training Performed Yes   VAD Patient? No     Pain Assessment   Currently in Pain? No/denies   Multiple Pain Sites No      Capillary Blood Glucose: No results found for this or any previous visit (from the past 24 hour(s)).      Exercise Prescription Changes - 05/16/16 1223      Exercise Review   Progression Yes     Response to Exercise   Blood Pressure (Admit) 120/70   Blood Pressure (Exercise) 124/70   Blood Pressure (Exit) 118/68   Heart Rate (Admit) 76 bpm   Heart Rate (Exercise) 105 bpm   Heart Rate (Exit) 70 bpm   Oxygen Saturation (Admit) 95 %   Oxygen Saturation (Exercise) 98 %   Oxygen Saturation (Exit) 97 %   Rating of Perceived Exertion (Exercise) 12   Perceived Dyspnea (Exercise) 3   Duration Progress to 45 minutes of aerobic exercise without signs/symptoms of physical distress   Intensity --  40-80% HRR     Progression   Progression Continue progressive overload as per policy without signs/symptoms or physical distress.     Resistance Training   Training Prescription Yes   Weight blue bands   Reps 10-12  10 minutes of strength  training     Interval Training   Interval Training No     Bike   Level 1   Minutes 17     Track   Laps 23   Minutes 17     Goals Met:  Using PLB without cueing & demonstrates good technique Exercise tolerated well No report of cardiac concerns or symptoms Strength training completed today  Goals Unmet:  Not Applicable  Comments: Service time is from 1030 to 1215   Dr. Rush Farmer is Medical Director for Pulmonary Rehab at [facility].

## 2016-05-21 ENCOUNTER — Encounter (HOSPITAL_COMMUNITY)
Admission: RE | Admit: 2016-05-21 | Discharge: 2016-05-21 | Disposition: A | Discharge status: Home / Self Care | Payer: Non-veteran care | Source: Ambulatory Visit | Attending: Pulmonary Disease | Admitting: Pulmonary Disease

## 2016-05-21 VITALS — Wt 221.3 lb

## 2016-05-21 DIAGNOSIS — J439 Emphysema, unspecified: Secondary | ICD-10-CM | POA: Diagnosis not present

## 2016-05-21 NOTE — Progress Notes (Signed)
Daily Session Note  Patient Details  Name: Arthur Harper MRN: [id number] Date of Birth: Aug 21, 1945 Referring Provider:   April Manson Pulmonary Rehab Walk Test from 04/30/2016 in [place]  Referring Provider  Dr. Gwenette Greet      Encounter Date: 05/21/2016  Check In:     Session Check In - 05/21/16 1028      Check-In   Location [facility]   Staff Present Rosebud Poles, RN, BSN;Molly diVincenzo, MS, ACSM RCEP, Exercise Physiologist;Zakyia Gagan Ysidro Evert, RN;Portia Rollene Rotunda, RN, BSN   Supervising physician immediately available to respond to emergencies [facility] immediately available   Physician(s) Dr. Posey Pronto   Medication changes reported     No   Fall or balance concerns reported    No   Warm-up and Cool-down Performed as group-led instruction   Resistance Training Performed Yes   VAD Patient? No     Pain Assessment   Currently in Pain? No/denies   Multiple Pain Sites No      Capillary Blood Glucose: No results found for this or any previous visit (from the past 24 hour(s)).      Exercise Prescription Changes - 05/21/16 1200      Exercise Review   Progression Yes     Response to Exercise   Blood Pressure (Admit) 120/74   Blood Pressure (Exercise) 134/82   Blood Pressure (Exit) 108/68   Heart Rate (Admit) 62 bpm   Heart Rate (Exercise) 88 bpm   Heart Rate (Exit) 64 bpm   Oxygen Saturation (Admit) 96 %   Oxygen Saturation (Exercise) 96 %   Oxygen Saturation (Exit) 96 %   Rating of Perceived Exertion (Exercise) 12   Perceived Dyspnea (Exercise) 1   Duration Progress to 45 minutes of aerobic exercise without signs/symptoms of physical distress   Intensity THRR unchanged     Progression   Progression Continue to progress workloads to maintain intensity without signs/symptoms of physical distress.     Resistance Training   Training Prescription Yes   Weight blue bands  blue bands   Reps 10-12  10 minutes of  strength training     Interval Training   Interval Training No     Bike   Level 1.1   Minutes 17     NuStep   Level 6   Minutes 17   METs 3     Track   Laps 22   Minutes 17     Goals Met:  Exercise tolerated well No report of cardiac concerns or symptoms Strength training completed today  Goals Unmet:  Not Applicable  Comments: Service time is from 1030 to 1200    Dr. Rush Farmer is Medical Director for Pulmonary Rehab at [facility].

## 2016-05-23 ENCOUNTER — Encounter (HOSPITAL_COMMUNITY): Payer: Non-veteran care

## 2016-05-28 ENCOUNTER — Encounter (HOSPITAL_COMMUNITY): Payer: Non-veteran care

## 2016-05-30 ENCOUNTER — Encounter (HOSPITAL_COMMUNITY): Admission: RE | Admit: 2016-05-30 | Payer: Non-veteran care | Source: Ambulatory Visit

## 2016-06-04 ENCOUNTER — Encounter (HOSPITAL_COMMUNITY)
Admission: RE | Admit: 2016-06-04 | Discharge: 2016-06-04 | Disposition: A | Discharge status: Home / Self Care | Payer: Non-veteran care | Source: Ambulatory Visit | Attending: Pulmonary Disease | Admitting: Pulmonary Disease

## 2016-06-04 DIAGNOSIS — J439 Emphysema, unspecified: Secondary | ICD-10-CM | POA: Diagnosis not present

## 2016-06-04 NOTE — Progress Notes (Signed)
Daily Session Note  Patient Details  Name: Arthur Harper MRN: [id number] Date of Birth: [birth year] Referring Provider:   April Manson Pulmonary Rehab Walk Test from 04/30/2016 in [place]  Referring Provider  Dr. Gwenette Greet      Encounter Date: 06/04/2016  Check In:     Session Check In - 06/04/16 1030      Check-In   Location [facility]   Staff Present Rosebud Poles, RN, BSN;Barett Whidbee, MS, ACSM RCEP, Exercise Physiologist;Lisa Ysidro Evert, RN;Portia Rollene Rotunda, RN, BSN   Supervising physician immediately available to respond to emergencies [facility] immediately available   Physician(s) Dr. Posey Pronto   Medication changes reported     No   Fall or balance concerns reported    No   Warm-up and Cool-down Performed as group-led instruction   Resistance Training Performed Yes   VAD Patient? No     Pain Assessment   Currently in Pain? No/denies   Multiple Pain Sites No      Capillary Blood Glucose: No results found for this or any previous visit (from the past 24 hour(s)).      Exercise Prescription Changes - 06/04/16 1300      Response to Exercise   Blood Pressure (Admit) 140/70   Blood Pressure (Exercise) 110/64   Heart Rate (Admit) 65 bpm   Heart Rate (Exercise) 104 bpm   Oxygen Saturation (Admit) 97 %   Oxygen Saturation (Exercise) 96 %   Rating of Perceived Exertion (Exercise) 12   Perceived Dyspnea (Exercise) 2   Duration Progress to 45 minutes of aerobic exercise without signs/symptoms of physical distress   Intensity THRR unchanged     Progression   Progression Continue to progress workloads to maintain intensity without signs/symptoms of physical distress.     Resistance Training   Training Prescription Yes   Weight blue bands  blue bands   Reps 10-12  10 minutes of strength training     Interval Training   Interval Training No     Bike   Level 1   Minutes 17     NuStep   Level 6   Minutes  17   METs 2.7     Track   Laps 18   Minutes 17     Goals Met:  Exercise tolerated well No report of cardiac concerns or symptoms Strength training completed today  Goals Unmet:  Not Applicable  Comments: Service time is from 10:30AM to 12:05P    Dr. Rush Farmer is Medical Director for Pulmonary Rehab at [facility].

## 2016-06-06 ENCOUNTER — Encounter (HOSPITAL_COMMUNITY)
Admission: RE | Admit: 2016-06-06 | Discharge: 2016-06-06 | Disposition: A | Discharge status: Home / Self Care | Payer: Non-veteran care | Source: Ambulatory Visit | Attending: Pulmonary Disease | Admitting: Pulmonary Disease

## 2016-06-06 DIAGNOSIS — J439 Emphysema, unspecified: Secondary | ICD-10-CM | POA: Diagnosis not present

## 2016-06-06 NOTE — Progress Notes (Signed)
Daily Session Note  Patient Details  Name: Arthur Harper MRN: [id number] Date of Birth: 13-May-1945 Referring Provider:   April Manson Pulmonary Rehab Walk Test from 04/30/2016 in [place]  Referring Provider  Dr. Gwenette Greet      Encounter Date: 06/06/2016  Check In:     Session Check In - 06/06/16 1213      Check-In   Location [facility]   Staff Present Su Hilt, MS, ACSM RCEP, Exercise Physiologist;Joan Leonia Reeves, RN, Luisa Hart, RN, BSN   Supervising physician immediately available to respond to emergencies [facility] immediately available   Physician(s) Dr. Algis Liming   Medication changes reported     No   Fall or balance concerns reported    No   Warm-up and Cool-down Performed as group-led instruction   Resistance Training Performed Yes   VAD Patient? No     Pain Assessment   Currently in Pain? No/denies   Multiple Pain Sites No      Capillary Blood Glucose: No results found for this or any previous visit (from the past 24 hour(s)).      Exercise Prescription Changes - 06/06/16 1200      Exercise Review   Progression Yes     Response to Exercise   Blood Pressure (Admit) 104/66   Blood Pressure (Exercise) 130/74   Blood Pressure (Exit) 106/66   Heart Rate (Admit) 73 bpm   Heart Rate (Exercise) 91 bpm   Heart Rate (Exit) 70 bpm   Oxygen Saturation (Admit) 96 %   Oxygen Saturation (Exercise) 95 %   Oxygen Saturation (Exit) 96 %   Rating of Perceived Exertion (Exercise) 12   Perceived Dyspnea (Exercise) 2   Duration Progress to 45 minutes of aerobic exercise without signs/symptoms of physical distress   Intensity THRR unchanged     Progression   Progression Continue to progress workloads to maintain intensity without signs/symptoms of physical distress.     Resistance Training   Training Prescription Yes   Weight blue bands  blue bands   Reps 10-12  10 minutes of strength training      Interval Training   Interval Training No     NuStep   Level 6   Minutes 17   METs 2.7     Track   Laps 21   Minutes 17     Goals Met:  Exercise tolerated well No report of cardiac concerns or symptoms Strength training completed today  Goals Unmet:  Not Applicable  Comments: Service time is from 10:30am to 12:00p    Dr. Rush Farmer is Medical Director for Pulmonary Rehab at [facility].

## 2016-06-06 NOTE — Progress Notes (Signed)
Pulmonary Individual Treatment Plan  Patient Details  Name: Arthur Harper MRN: [id number] Date of Birth: Nov 25, 1945 Referring Provider:   April Manson Pulmonary Rehab Walk Test from 04/30/2016 in [place]  Referring Provider  Dr. Gwenette Greet      Initial Encounter Date:  Flowsheet Row Pulmonary Rehab Walk Test from 04/30/2016 in [place]  Date  04/30/16  Referring Provider  Dr. Gwenette Greet      Visit Diagnosis: Pulmonary emphysema, unspecified emphysema type ([place])  Patient's Home Medications on Admission:   Current Outpatient Prescriptions:  .  albuterol (PROVENTIL HFA;VENTOLIN HFA) 108 (90 BASE) MCG/ACT inhaler, Inhale 2 puffs into the lungs every 6 (six) hours as needed., Disp: , Rfl:  .  aspirin 81 MG chewable tablet, Chew 81 mg by mouth daily., Disp: , Rfl:  .  budesonide-formoterol (SYMBICORT) 160-4.5 MCG/ACT inhaler, Inhale 2 puffs into the lungs 2 (two) times daily., Disp: , Rfl:  .  Multiple Vitamin (MULTIVITAMIN) capsule, Take 1 capsule by mouth daily., Disp: , Rfl:  .  terazosin (HYTRIN) 5 MG capsule, Take 5 mg by mouth at bedtime., Disp: , Rfl:  .  tiotropium (SPIRIVA) 18 MCG inhalation capsule, Place 18 mcg into inhaler and inhale daily., Disp: , Rfl:   Past Medical History: Past Medical History:  Diagnosis Date  . Agent orange exposure   . COPD (chronic obstructive pulmonary disease) ([place])   . PTSD (post-traumatic stress disorder)   . Sleep apnea     Tobacco Use: History  Smoking Status  . Former Smoker  . Packs/day: 1.00  . Years: 40.00  . Types: Cigarettes  . Quit date: 01/01/2012  Smokeless Tobacco  . Never Used    Comment: 10 cigs per day    Labs: Recent Review Flowsheet Data    Labs for ITP Cardiac and Pulmonary Rehab Latest Ref Rng & Units 12/25/2007 08/14/2012   Cholestrol 0 - 200 mg/dL 171 165   LDLCALC 0 - 99 mg/dL 105(H) 98   HDL >39.00 mg/dL 55.4 54.10   Trlycerides 0.0 - 149.0  mg/dL 52 65.0      Capillary Blood Glucose: No results found for: GLUCAP   ADL UCSD:     Pulmonary Assessment Scores    Row Name 05/02/16 1709         ADL UCSD   ADL Phase Entry     SOB Score total 64        Pulmonary Function Assessment:     Pulmonary Function Assessment - 04/26/16 1314      Breath   Bilateral Breath Sounds Decreased  bases, clear uppers   Shortness of Breath Yes  mild shortness of breath sometimes limits strenuous activity      Exercise Target Goals:    Exercise Program Goal: Individual exercise prescription set with THRR, safety & activity barriers. Participant demonstrates ability to understand and report RPE using BORG scale, to self-measure pulse accurately, and to acknowledge the importance of the exercise prescription.  Exercise Prescription Goal: Starting with aerobic activity 30 plus minutes a day, 3 days per week for initial exercise prescription. Provide home exercise prescription and guidelines that participant acknowledges understanding prior to discharge.  Activity Barriers & Risk Stratification:     Activity Barriers & Cardiac Risk Stratification - 04/26/16 1313      Activity Barriers & Cardiac Risk Stratification   Activity Barriers Deconditioning;Shortness of Breath      6 Minute Walk:     6  Minute Walk    Row Name 04/30/16 1544         6 Minute Walk   Phase Initial     Distance 1515 feet     Walk Time 6 minutes     # of Rest Breaks 0     MPH 2.86     METS 3.14     RPE 12     Perceived Dyspnea  1     Symptoms No     Resting HR 79 bpm     Resting BP 152/84     Max Ex. HR 94 bpm     Max Ex. BP 152/74       Interval HR   Baseline HR 79     1 Minute HR 86     2 Minute HR 97     3 Minute HR 91     4 Minute HR 94     5 Minute HR 93     6 Minute HR 93     2 Minute Post HR 78     Interval Heart Rate? Yes       Interval Oxygen   Interval Oxygen? Yes     Baseline Oxygen Saturation % 97 %     Baseline  Liters of Oxygen 0 L     1 Minute Oxygen Saturation % 95 %     1 Minute Liters of Oxygen 0 L     2 Minute Oxygen Saturation % 94 %     2 Minute Liters of Oxygen 0 L     3 Minute Oxygen Saturation % 94 %     3 Minute Liters of Oxygen 0 L     4 Minute Oxygen Saturation % 94 %     4 Minute Liters of Oxygen 0 L     5 Minute Oxygen Saturation % 95 %     5 Minute Liters of Oxygen 0 L     6 Minute Oxygen Saturation % 95 %     6 Minute Liters of Oxygen 0 L     2 Minute Post Oxygen Saturation % 97 %     2 Minute Post Liters of Oxygen 0 L        Initial Exercise Prescription:     Initial Exercise Prescription - 04/30/16 1500      Date of Initial Exercise RX and Referring Provider   Date 04/30/16   Referring Provider Dr. Gwenette Greet     Bike   Level 0.7   Minutes 17     NuStep   Level 2   Minutes 17   METs 1.5     Track   Laps 10   Minutes 17     Prescription Details   Frequency (times per week) 2   Duration Progress to 45 minutes of aerobic exercise without signs/symptoms of physical distress     Intensity   THRR 40-80% of Max Heartrate 60-120   Ratings of Perceived Exertion 11-13   Perceived Dyspnea 0-4     Progression   Progression Continue progressive overload as per policy without signs/symptoms or physical distress.     Resistance Training   Training Prescription Yes   Weight blue bands   Reps 10-12      Perform Capillary Blood Glucose checks as needed.  Exercise Prescription Changes:     Exercise Prescription Changes    Row Name 05/07/16 1200 05/14/16 1200 05/16/16 1223 05/21/16 1200 06/04/16 1300  Exercise Review   Progression  - Yes Yes Yes  -     Response to Exercise   Blood Pressure (Admit) 108/68 122/64 120/70 120/74 140/70   Blood Pressure (Exercise) 144/80 130/76 124/70 134/82 110/64   Blood Pressure (Exit) 100/60 100/62 118/68 108/68  -   Heart Rate (Admit) 66 bpm 65 bpm 76 bpm 62 bpm 65 bpm   Heart Rate (Exercise) 81 bpm 85 bpm 105 bpm 88  bpm 104 bpm   Heart Rate (Exit) 72 bpm 75 bpm 70 bpm 64 bpm  -   Oxygen Saturation (Admit) 98 % 95 % 95 % 96 % 97 %   Oxygen Saturation (Exercise) 96 % 94 % 98 % 96 % 96 %   Oxygen Saturation (Exit) 96 % 95 % 97 % 96 %  -   Rating of Perceived Exertion (Exercise) _0 Perceived Dyspnea (Exercise) _1 Duration Progress to 45 minutes of aerobic exercise without signs/symptoms of physical distress Progress to 45 minutes of aerobic exercise without signs/symptoms of physical distress Progress to 45 minutes of aerobic exercise without signs/symptoms of physical distress Progress to 45 minutes of aerobic exercise without signs/symptoms of physical distress Progress to 45 minutes of aerobic exercise without signs/symptoms of physical distress   Intensity -  40-80% HRR -  40-80% HRR -  40-80% HRR THRR unchanged THRR unchanged     Progression   Progression  - Continue progressive overload as per policy without signs/symptoms or physical distress. Continue progressive overload as per policy without signs/symptoms or physical distress. Continue to progress workloads to maintain intensity without signs/symptoms of physical distress. Continue to progress workloads to maintain intensity without signs/symptoms of physical distress.     Resistance Training   Training Prescription _2    Weight blue bands blue bands blue bands blue bands  blue bands blue bands  blue bands   Reps 10-12  10 minutes of strength training 10-12  10 minutes of strength training 10-12  10 minutes of strength training 10-12  10 minutes of strength training 10-12  10 minutes of strength training     Interval Training   Interval Training _3      Bike   Level 0.7 0.7 1 1.1 1   Minutes _4 NuStep   Level 2 4  - 6 6   Minutes 17 17  - 17 17   METs 1.8 2.1  - 3 2.7     Track   Laps _5 Minutes _6 Exercise Comments:      Exercise Comments    Row Name 05/13/16 1644 06/03/16 1309         Exercise Comments Patient has only attended one exercise session. Will cont. to monitor and progress.  Patient has attended 4 complete exercise sessions. Walking up to 22 laps in 15 minutes. Will cont. to monitor and progress.          Discharge Exercise Prescription (Final Exercise Prescription Changes):     Exercise Prescription Changes - 06/04/16 1300      Response to Exercise   Blood Pressure (Admit) 140/70   Blood Pressure (Exercise) 110/64   Heart Rate (Admit) 65 bpm   Heart Rate (Exercise) 104 bpm   Oxygen Saturation (Admit) 97 %   Oxygen  Saturation (Exercise) 96 %   Rating of Perceived Exertion (Exercise) 12   Perceived Dyspnea (Exercise) 2   Duration Progress to 45 minutes of aerobic exercise without signs/symptoms of physical distress   Intensity THRR unchanged     Progression   Progression Continue to progress workloads to maintain intensity without signs/symptoms of physical distress.     Resistance Training   Training Prescription Yes   Weight blue bands  blue bands   Reps 10-12  10 minutes of strength training     Interval Training   Interval Training No     Bike   Level 1   Minutes 17     NuStep   Level 6   Minutes 17   METs 2.7     Track   Laps 18   Minutes 17       Nutrition:  Target Goals: Understanding of nutrition guidelines, daily intake of sodium <1575m, cholesterol <2041m calories 30% from fat and 7% or less from saturated fats, daily to have 5 or more servings of fruits and vegetables.  Biometrics:     Pre Biometrics - 04/26/16 1405      Pre Biometrics   Grip Strength 48 kg       Nutrition Therapy Plan and Nutrition Goals:     Nutrition Therapy & Goals - 05/20/16 1449      Nutrition Therapy   Diet General Healthful     Intervention Plan   Intervention Prescribe, educate and counsel regarding individualized specific dietary modifications aiming  towards targeted core components such as weight, hypertension, lipid management, diabetes, heart failure and other comorbidities.   Expected Outcomes Short Term Goal: Understand basic principles of dietary content, such as calories, fat, sodium, cholesterol and nutrients.;Long Term Goal: Adherence to prescribed nutrition plan.      Nutrition Discharge: Rate Your Plate Scores:     Nutrition Assessments - 05/20/16 1449      Rate Your Plate Scores   Pre Score 39      Psychosocial: Target Goals: Acknowledge presence or absence of depression, maximize coping skills, provide positive support system. Participant is able to verbalize types and ability to use techniques and skills needed for reducing stress and depression.  Initial Review & Psychosocial Screening:     Initial Psych Review & Screening - 04/26/16 [id number]     Barriers   Psychosocial barriers to participate in program There are no identifiable barriers or psychosocial needs.     Screening Interventions   Interventions Encouraged to exercise      Quality of Life Scores:     Quality of Life - 05/02/16 1709      Quality of Life Scores   Health/Function Pre 20.7 %   Socioeconomic Pre 25.5 %   Psych/Spiritual Pre 24 %   Family Pre 19.5 %   GLOBAL Pre 22.37 %      PHQ-9: Recent Review Flowsheet Data    Depression screen PHQ 2/9 04/26/2016   Decreased Interest 1   Down, Depressed, Hopeless 0   PHQ - 2 Score 1      Psychosocial Evaluation and Intervention:     Psychosocial Evaluation - 04/26/16 1323      Psychosocial Evaluation & Interventions   Interventions Encouraged to exercise with the program and follow exercise prescription      Psychosocial Re-Evaluation:     Psychosocial Re-Evaluation    [place] 05/14/16 0732 06/03/16  1603           Psychosocial Re-Evaluation   Interventions Encouraged to attend Pulmonary Rehabilitation for the exercise  Encouraged to attend Pulmonary Rehabilitation for the exercise      Comments no psychosocial barriers identified since admission no psychosocial barriers identified since admission        Education: Education Goals: Education classes will be provided on a weekly basis, covering required topics. Participant will state understanding/return demonstration of topics presented.  Learning Barriers/Preferences:     Learning Barriers/Preferences - 04/26/16 1314      Learning Barriers/Preferences   Learning Barriers None   Learning Preferences Computer/Internet;Group Instruction;Individual Instruction;Skilled Demonstration;Written Material      Education Topics: Risk Factor Reduction:  -Group instruction that is supported by a PowerPoint presentation. Instructor discusses the definition of a risk factor, different risk factors for pulmonary disease, and how the heart and lungs work together.     Nutrition for Pulmonary Patient:  -Group instruction provided by PowerPoint slides, verbal discussion, and written materials to support subject matter. The instructor gives an explanation and review of healthy diet recommendations, which includes a discussion on weight management, recommendations for fruit and vegetable consumption, as well as protein, fluid, caffeine, fiber, sodium, sugar, and alcohol. Tips for eating when patients are short of breath are discussed.   Pursed Lip Breathing:  -Group instruction that is supported by demonstration and informational handouts. Instructor discusses the benefits of pursed lip and diaphragmatic breathing and detailed demonstration on how to preform both.     Oxygen Safety:  -Group instruction provided by PowerPoint, verbal discussion, and written material to support subject matter. There is an overview of "What is Oxygen" and "Why do we need it".  Instructor also reviews how to create a safe environment for oxygen use, the importance of using oxygen as  prescribed, and the risks of noncompliance. There is a brief discussion on traveling with oxygen and resources the patient may utilize.   Oxygen Equipment:  -Group instruction provided by [facility] Staff utilizing handouts, written materials, and equipment demonstrations.   Signs and Symptoms:  -Group instruction provided by written material and verbal discussion to support subject matter. Warning signs and symptoms of infection, stroke, and heart attack are reviewed and when to call the physician/911 reinforced. Tips for preventing the spread of infection discussed.   Advanced Directives:  -Group instruction provided by verbal instruction and written material to support subject matter. Instructor reviews Advanced Directive laws and proper instruction for filling out document.   Pulmonary Video:  -Group video education that reviews the importance of medication and oxygen compliance, exercise, good nutrition, pulmonary hygiene, and pursed lip and diaphragmatic breathing for the pulmonary patient.   Exercise for the Pulmonary Patient:  -Group instruction that is supported by a PowerPoint presentation. Instructor discusses benefits of exercise, core components of exercise, frequency, duration, and intensity of an exercise routine, importance of utilizing pulse oximetry during exercise, safety while exercising, and options of places to exercise outside of rehab.     Pulmonary Medications:  -Verbally interactive group education provided by instructor with focus on inhaled medications and proper administration. Flowsheet Row PULMONARY REHAB OTHER RESPIRATORY from 05/16/2016 in [place]  Date  05/16/16  Educator  Pharm D  Instruction Review Code  2- meets goals/outcomes      Anatomy and Physiology of the Respiratory System and Intimacy:  -Group instruction provided by PowerPoint, verbal discussion, and written material to support subject matter. Instructor  reviews respiratory cycle and anatomical components of the respiratory system and their functions. Instructor also reviews differences in obstructive and restrictive respiratory diseases with examples of each. Intimacy, Sex, and Sexuality differences are reviewed with a discussion on how relationships can change when diagnosed with pulmonary disease. Common sexual concerns are reviewed.   Knowledge Questionnaire Score:     Knowledge Questionnaire Score - 05/02/16 1708      Knowledge Questionnaire Score   Pre Score 8/13      Core Components/Risk Factors/Patient Goals at Admission:     Personal Goals and Risk Factors at Admission - 04/26/16 1321      Core Components/Risk Factors/Patient Goals on Admission    Weight Management Yes;Obesity   Intervention Obesity: Provide education and appropriate resources to help participant work on and attain dietary goals.;Weight Management/Obesity: Establish reasonable short term and long term weight goals.   Expected Outcomes Short Term: Continue to assess and modify interventions until short term weight is achieved;Understanding of distribution of calorie intake throughout the day with the consumption of 4-5 meals/snacks;Weight Loss: Understanding of general recommendations for a balanced deficit meal plan, which promotes 1-2 lb weight loss per week and includes a negative energy balance of [phone] kcal/d;Understanding recommendations for meals to include 15-35% energy as protein, 25-35% energy from fat, 35-60% energy from carbohydrates, less than 255m of dietary cholesterol, 20-35 gm of total fiber daily   Improve shortness of breath with ADL's Yes   Intervention Provide education, individualized exercise plan and daily activity instruction to help decrease symptoms of SOB with activities of daily living.   Expected Outcomes Short Term: Achieves a reduction of symptoms when performing activities of daily living.   Develop more efficient breathing  techniques such as purse lipped breathing and diaphragmatic breathing; and practicing self-pacing with activity Yes   Intervention Provide education, demonstration and support about specific breathing techniuqes utilized for more efficient breathing. Include techniques such as pursed lipped breathing, diaphragmatic breathing and self-pacing activity.   Expected Outcomes Short Term: Participant will be able to demonstrate and use breathing techniques as needed throughout daily activities.      Core Components/Risk Factors/Patient Goals Review:      Goals and Risk Factor Review    Row Name 05/14/16 0731 06/03/16 1603           Core Components/Risk Factors/Patient Goals Review   Personal Goals Review Weight Management/Obesity;Improve shortness of breath with ADL's;Increase Strength and Stamina Weight Management/Obesity;Improve shortness of breath with ADL's;Increase Strength and Stamina      Review see comments section on ITP see comments section on ITP      Expected Outcomes See admission expected outcomes See admission expected outcomes         Core Components/Risk Factors/Patient Goals at Discharge (Final Review):      Goals and Risk Factor Review - 06/03/16 1603      Core Components/Risk Factors/Patient Goals Review   Personal Goals Review Weight Management/Obesity;Improve shortness of breath with ADL's;Increase Strength and Stamina   Review see comments section on ITP   Expected Outcomes See admission expected outcomes      ITP Comments:   Comments: ITP REVIEW Pt is making expected progress toward pulmonary rehab goals after completing 5 sessions. He is having to take [place] of [place] of [place] and has missed the last 3 session however when he attends his exercise sessions he works hard and makes good progress. Recommend continued exercise, life style modification, education, and utilization of breathing  techniques to increase stamina and strength and decrease  shortness of breath with exertion.

## 2016-06-11 ENCOUNTER — Encounter (HOSPITAL_COMMUNITY)
Admission: RE | Admit: 2016-06-11 | Discharge: 2016-06-11 | Disposition: A | Discharge status: Home / Self Care | Payer: Non-veteran care | Source: Ambulatory Visit | Attending: Pulmonary Disease | Admitting: Pulmonary Disease

## 2016-06-11 VITALS — Wt 220.2 lb

## 2016-06-11 DIAGNOSIS — J439 Emphysema, unspecified: Secondary | ICD-10-CM

## 2016-06-11 NOTE — Progress Notes (Signed)
Daily Session Note  Patient Details  Name: Arthur Harper MRN: [id number] Date of Birth: 02/11/1946 Referring Provider:   April Manson Pulmonary Rehab Walk Test from 04/30/2016 in [place]  Referring Provider  Dr. Gwenette Greet      Encounter Date: 06/11/2016  Check In:     Session Check In - 06/11/16 1030      Check-In   Location [facility]   Staff Present Rosebud Poles, RN, BSN;Molly diVincenzo, MS, ACSM RCEP, Exercise Physiologist;Brannon Levene Ysidro Evert, RN;Portia Rollene Rotunda, RN, BSN   Supervising physician immediately available to respond to emergencies [facility] immediately available   Physician(s) Dr. Candiss Norse   Medication changes reported     No   Fall or balance concerns reported    No   Warm-up and Cool-down Performed as group-led instruction   Resistance Training Performed Yes   VAD Patient? No     Pain Assessment   Currently in Pain? No/denies   Multiple Pain Sites No      Capillary Blood Glucose: No results found for this or any previous visit (from the past 24 hour(s)).      Exercise Prescription Changes - 06/11/16 1200      Response to Exercise   Blood Pressure (Admit) 120/70   Blood Pressure (Exercise) 120/70   Blood Pressure (Exit) 104/62   Heart Rate (Admit) 66 bpm   Heart Rate (Exercise) 99 bpm   Heart Rate (Exit) 67 bpm   Oxygen Saturation (Admit) 98 %   Oxygen Saturation (Exercise) 96 %   Oxygen Saturation (Exit) 97 %   Rating of Perceived Exertion (Exercise) 11   Perceived Dyspnea (Exercise) 1   Duration Progress to 45 minutes of aerobic exercise without signs/symptoms of physical distress   Intensity THRR unchanged     Progression   Progression Continue to progress workloads to maintain intensity without signs/symptoms of physical distress.     Resistance Training   Training Prescription Yes   Weight blue bands  10 minutes of strength training   Reps 10-12     Interval Training   Interval  Training No     Bike   Level 1   Minutes 17     NuStep   Level 6   Minutes 17   METs 3.1     Track   Laps 18   Minutes 17     Goals Met:  Exercise tolerated well No report of cardiac concerns or symptoms Strength training completed today  Goals Unmet:  Not Applicable  Comments: Service time is from 1030 to 1200    Dr. Rush Farmer is Medical Director for Pulmonary Rehab at [facility].

## 2016-06-13 ENCOUNTER — Encounter (HOSPITAL_COMMUNITY): Payer: Non-veteran care

## 2016-06-18 ENCOUNTER — Encounter (HOSPITAL_COMMUNITY)
Admission: RE | Admit: 2016-06-18 | Discharge: 2016-06-18 | Disposition: A | Discharge status: Home / Self Care | Payer: Non-veteran care | Source: Ambulatory Visit | Attending: Pulmonary Disease | Admitting: Pulmonary Disease

## 2016-06-18 VITALS — Wt 221.1 lb

## 2016-06-18 DIAGNOSIS — J439 Emphysema, unspecified: Secondary | ICD-10-CM

## 2016-06-18 NOTE — Progress Notes (Signed)
Lucius Conn 71 y.o. male Nutrition Note Spoke with pt. Pt is obese. There is some room for improvement in pt's diet according to pt's Rate Your Plate results. Areas for dietary improvement discussed. Pt does not currently avoid salty food. Pt adds salt to food. The role of sodium in lung disease reviewed with pt. Pt expressed understanding of the information reviewed via feedback method.    No results found for: HGBA1C  Nutrition Diagnosis ? Food-and nutrition-related knowledge deficit related to lack of exposure to information as related to diagnosis of pulmonary disease ? Obesity related to excessive energy intake as evidenced by a BMI of 34.9  Nutrition Intervention ? Pt's individual nutrition plan and goals reviewed with pt. ? Benefits of adopting healthy eating habits discussed when pt's Rate Your Plate reviewed. ? Pt to attend the Nutrition and Lung Disease class ? Handouts given for 1500 kcal, 5 day menu ideas ? Continual client-centered nutrition education by RD, as part of interdisciplinary care.  Goal(s) 1. Identify food quantities necessary to achieve wt loss of  -2# per week to a goal wt loss of 2.7-10.9 kg (6-24 lb) at graduation from pulmonary rehab. 2. Decrease sodium added at the table 3. Increase intake of fruits, vegetables, and whole grains. Monitor and Evaluate progress toward nutrition goal with team.   Derek Mound, M.Ed, RD, LDN, CDE 06/18/2016 12:02 PM

## 2016-06-18 NOTE — Progress Notes (Signed)
Daily Session Note  Patient Details  Name: Arthur Harper MRN: [id number] Date of Birth: [birth year] Referring Provider:   April Manson Pulmonary Rehab Walk Test from 04/30/2016 in [place]  Referring Provider  Dr. Gwenette Greet      Encounter Date: 06/18/2016  Check In:     Session Check In - 06/18/16 1020      Check-In   Location [facility]   Staff Present Rosebud Poles, RN, BSN;Molly diVincenzo, MS, ACSM RCEP, Exercise Physiologist;Lisa Ysidro Evert, RN;Branndon Tuite Rollene Rotunda, RN, BSN   Supervising physician immediately available to respond to emergencies [facility] immediately available   Physician(s) Dr. Posey Pronto   Medication changes reported     No   Fall or balance concerns reported    No   Tobacco Cessation No Change   Warm-up and Cool-down Performed as group-led instruction   Resistance Training Performed Yes   VAD Patient? No     Pain Assessment   Currently in Pain? No/denies   Multiple Pain Sites No      Capillary Blood Glucose: No results found for this or any previous visit (from the past 24 hour(s)).      Exercise Prescription Changes - 06/18/16 1215      Response to Exercise   Blood Pressure (Admit) 96/60   Blood Pressure (Exercise) 130/60   Blood Pressure (Exit) 108/82   Heart Rate (Admit) 68 bpm   Heart Rate (Exercise) 88 bpm   Heart Rate (Exit) 76 bpm   Oxygen Saturation (Admit) 97 %   Oxygen Saturation (Exercise) 97 %   Oxygen Saturation (Exit) 97 %   Rating of Perceived Exertion (Exercise) 12   Perceived Dyspnea (Exercise) 1   Duration Continue with 45 min of aerobic exercise without signs/symptoms of physical distress.   Intensity THRR unchanged     Progression   Progression Continue to progress workloads to maintain intensity without signs/symptoms of physical distress.     Resistance Training   Training Prescription Yes   Weight blue bands   Reps 10-15   Time 10 Minutes     Interval Training   Interval Training No     Bike   Level 1   Minutes 17     NuStep   Level 6   Minutes 17   METs 3.3     Track   Laps 22   Minutes 17      History  Smoking Status  . Former Smoker  . Packs/day: 1.00  . Years: 40.00  . Types: Cigarettes  . Quit date: 01/01/2012  Smokeless Tobacco  . Never Used    Comment: 10 cigs per day    Goals Met:  Independence with exercise equipment Improved SOB with ADL's Using PLB without cueing & demonstrates good technique Exercise tolerated well No report of cardiac concerns or symptoms Strength training completed today  Goals Unmet:  Not Applicable  Comments: Service time is from 1030 to 1205   Dr. Rush Farmer is Medical Director for Pulmonary Rehab at [facility].

## 2016-06-20 ENCOUNTER — Encounter (HOSPITAL_COMMUNITY)
Admission: RE | Admit: 2016-06-20 | Discharge: 2016-06-20 | Disposition: A | Discharge status: Home / Self Care | Payer: Non-veteran care | Source: Ambulatory Visit | Attending: Pulmonary Disease | Admitting: Pulmonary Disease

## 2016-06-20 VITALS — Wt 222.0 lb

## 2016-06-20 DIAGNOSIS — J439 Emphysema, unspecified: Secondary | ICD-10-CM | POA: Insufficient documentation

## 2016-06-20 NOTE — Progress Notes (Signed)
Daily Session Note  Patient Details  Name: Arthur Harper MRN: [id number] Date of Birth: November 21, 1945 Referring Provider:   April Manson Pulmonary Rehab Walk Test from 04/30/2016 in [place]  Referring Provider  Dr. Gwenette Greet      Encounter Date: 06/20/2016  Check In:     Session Check In - 06/20/16 1030      Check-In   Location [facility]   Staff Present Rosebud Poles, RN, BSN;Molly diVincenzo, MS, ACSM RCEP, Exercise Physiologist;Lisa Ysidro Evert, RN;Ludell Zacarias Rollene Rotunda, RN, BSN   Supervising physician immediately available to respond to emergencies [facility] immediately available   Physician(s) Dr. Sloan Leiter   Medication changes reported     No   Fall or balance concerns reported    No   Tobacco Cessation No Change   Warm-up and Cool-down Performed as group-led instruction   Resistance Training Performed Yes   VAD Patient? No     Pain Assessment   Currently in Pain? No/denies   Multiple Pain Sites No      Capillary Blood Glucose: No results found for this or any previous visit (from the past 24 hour(s)).      Exercise Prescription Changes - 06/20/16 1241      Response to Exercise   Blood Pressure (Admit) 104/70   Blood Pressure (Exercise) 130/80   Blood Pressure (Exit) 108/66   Heart Rate (Admit) 65 bpm   Heart Rate (Exercise) 88 bpm   Heart Rate (Exit) 70 bpm   Oxygen Saturation (Admit) 97 %   Oxygen Saturation (Exercise) 96 %   Oxygen Saturation (Exit) 96 %   Rating of Perceived Exertion (Exercise) 12   Perceived Dyspnea (Exercise) 1   Duration Continue with 45 min of aerobic exercise without signs/symptoms of physical distress.   Intensity THRR unchanged     Progression   Progression Continue to progress workloads to maintain intensity without signs/symptoms of physical distress.     Resistance Training   Training Prescription Yes   Weight blue bands   Reps 10-15   Time 10 Minutes     Interval Training    Interval Training No     Bike   Level 1.3   Minutes 17     NuStep   Level 7   Minutes 17     Exercise Review   Progression Yes      History  Smoking Status  . Former Smoker  . Packs/day: 1.00  . Years: 40.00  . Types: Cigarettes  . Quit date: 01/01/2012  Smokeless Tobacco  . Never Used    Comment: 10 cigs per day    Goals Met:  Independence with exercise equipment Improved SOB with ADL's Using PLB without cueing & demonstrates good technique Exercise tolerated well No report of cardiac concerns or symptoms Strength training completed today  Goals Unmet:  Not Applicable  Comments: Service time is from 1030 to 1220   Dr. Rush Farmer is Medical Director for Pulmonary Rehab at [facility].

## 2016-06-25 ENCOUNTER — Encounter (HOSPITAL_COMMUNITY): Payer: Non-veteran care

## 2016-06-27 ENCOUNTER — Encounter (HOSPITAL_COMMUNITY)
Admission: RE | Admit: 2016-06-27 | Discharge: 2016-06-27 | Disposition: A | Discharge status: Home / Self Care | Payer: Non-veteran care | Source: Ambulatory Visit | Attending: Pulmonary Disease | Admitting: Pulmonary Disease

## 2016-06-27 VITALS — Wt 221.3 lb

## 2016-06-27 DIAGNOSIS — J439 Emphysema, unspecified: Secondary | ICD-10-CM | POA: Diagnosis not present

## 2016-06-27 NOTE — Progress Notes (Signed)
Daily Session Note  Patient Details  Name: Arthur Harper MRN: [id number] Date of Birth: [birth year] Referring Provider:   April Manson Pulmonary Rehab Walk Test from 04/30/2016 in [place]  Referring Provider  Dr. Gwenette Greet      Encounter Date: 06/27/2016  Check In:     Session Check In - 06/27/16 1030      Check-In   Location [facility]   Staff Present Rosebud Poles, RN, BSN;Lisa Ysidro Evert, RN;Portia Rollene Rotunda, RN, BSN   Supervising physician immediately available to respond to emergencies [facility] immediately available   Physician(s) Dr. Tana Coast   Medication changes reported     No   Fall or balance concerns reported    No   Tobacco Cessation No Change   Warm-up and Cool-down Performed as group-led instruction   Resistance Training Performed Yes   VAD Patient? No     Pain Assessment   Currently in Pain? No/denies   Multiple Pain Sites No      Capillary Blood Glucose: No results found for this or any previous visit (from the past 24 hour(s)).      Exercise Prescription Changes - 06/27/16 1200      Response to Exercise   Blood Pressure (Admit) 124/60   Blood Pressure (Exercise) 136/74   Blood Pressure (Exit) 114/78   Heart Rate (Admit) 69 bpm   Heart Rate (Exercise) 99 bpm   Heart Rate (Exit) 81 bpm   Oxygen Saturation (Admit) 97 %   Oxygen Saturation (Exercise) 97 %   Oxygen Saturation (Exit) 96 %   Rating of Perceived Exertion (Exercise) 13   Perceived Dyspnea (Exercise) 1   Duration Continue with 45 min of aerobic exercise without signs/symptoms of physical distress.   Intensity THRR unchanged     Progression   Progression Continue to progress workloads to maintain intensity without signs/symptoms of physical distress.     Resistance Training   Training Prescription Yes   Weight blue bands   Reps 10-15   Time 10 Minutes     Interval Training   Interval Training No     Bike   Level 1.1   Minutes  17     Track   Laps 21   Minutes 17      History  Smoking Status  . Former Smoker  . Packs/day: 1.00  . Years: 40.00  . Types: Cigarettes  . Quit date: 01/01/2012  Smokeless Tobacco  . Never Used    Comment: 10 cigs per day    Goals Met:  Exercise tolerated well No report of cardiac concerns or symptoms Strength training completed today  Goals Unmet:  Not Applicable  Comments: Service time is from 10:30a to 12:40p    Dr. Rush Farmer is Medical Director for Pulmonary Rehab at [facility].

## 2016-07-02 ENCOUNTER — Encounter (HOSPITAL_COMMUNITY)
Admission: RE | Admit: 2016-07-02 | Discharge: 2016-07-02 | Disposition: A | Discharge status: Home / Self Care | Payer: Non-veteran care | Source: Ambulatory Visit | Attending: Pulmonary Disease | Admitting: Pulmonary Disease

## 2016-07-02 VITALS — Wt 220.9 lb

## 2016-07-02 DIAGNOSIS — J439 Emphysema, unspecified: Secondary | ICD-10-CM | POA: Diagnosis not present

## 2016-07-02 NOTE — Progress Notes (Signed)
Pulmonary Individual Treatment Plan  Patient Details  Name: Arthur Harper MRN: [id number] Date of Birth: May 14, 1945 Referring Provider:   April Manson Pulmonary Rehab Walk Test from 04/30/2016 in [place]  Referring Provider  Dr. Gwenette Greet      Initial Encounter Date:  Flowsheet Row Pulmonary Rehab Walk Test from 04/30/2016 in [place]  Date  04/30/16  Referring Provider  Dr. Gwenette Greet      Visit Diagnosis: Pulmonary emphysema, unspecified emphysema type ([place])  Patient's Home Medications on Admission:   Current Outpatient Prescriptions:  .  albuterol (PROVENTIL HFA;VENTOLIN HFA) 108 (90 BASE) MCG/ACT inhaler, Inhale 2 puffs into the lungs every 6 (six) hours as needed., Disp: , Rfl:  .  aspirin 81 MG chewable tablet, Chew 81 mg by mouth daily., Disp: , Rfl:  .  budesonide-formoterol (SYMBICORT) 160-4.5 MCG/ACT inhaler, Inhale 2 puffs into the lungs 2 (two) times daily., Disp: , Rfl:  .  Multiple Vitamin (MULTIVITAMIN) capsule, Take 1 capsule by mouth daily., Disp: , Rfl:  .  terazosin (HYTRIN) 5 MG capsule, Take 5 mg by mouth at bedtime., Disp: , Rfl:  .  tiotropium (SPIRIVA) 18 MCG inhalation capsule, Place 18 mcg into inhaler and inhale daily., Disp: , Rfl:   Past Medical History: Past Medical History:  Diagnosis Date  . Agent orange exposure   . COPD (chronic obstructive pulmonary disease) ([place])   . PTSD (post-traumatic stress disorder)   . Sleep apnea     Tobacco Use: History  Smoking Status  . Former Smoker  . Packs/day: 1.00  . Years: 40.00  . Types: Cigarettes  . Quit date: 01/01/2012  Smokeless Tobacco  . Never Used    Comment: 10 cigs per day    Labs: Recent Review Flowsheet Data    Labs for ITP Cardiac and Pulmonary Rehab Latest Ref Rng & Units 12/25/2007 08/14/2012   Cholestrol 0 - 200 mg/dL 171 165   LDLCALC 0 - 99 mg/dL 105(H) 98   HDL >39.00 mg/dL 55.4 54.10   Trlycerides 0.0 - 149.0  mg/dL 52 65.0      Capillary Blood Glucose: No results found for: GLUCAP   ADL UCSD:   Pulmonary Function Assessment:     Pulmonary Function Assessment - 04/26/16 1314      Breath   Bilateral Breath Sounds Decreased  bases, clear uppers   Shortness of Breath Yes  mild shortness of breath sometimes limits strenuous activity      Exercise Target Goals:    Exercise Program Goal: Individual exercise prescription set with THRR, safety & activity barriers. Participant demonstrates ability to understand and report RPE using BORG scale, to self-measure pulse accurately, and to acknowledge the importance of the exercise prescription.  Exercise Prescription Goal: Starting with aerobic activity 30 plus minutes a day, 3 days per week for initial exercise prescription. Provide home exercise prescription and guidelines that participant acknowledges understanding prior to discharge.  Activity Barriers & Risk Stratification:     Activity Barriers & Cardiac Risk Stratification - 04/26/16 1313      Activity Barriers & Cardiac Risk Stratification   Activity Barriers Deconditioning;Shortness of Breath      6 Minute Walk:     6 Minute Walk    Row Name 04/30/16 1544         6 Minute Walk   Phase Initial     Distance 1515 feet     Walk Time 6 minutes     #  of Rest Breaks 0     MPH 2.86     METS 3.14     RPE 12     Perceived Dyspnea  1     Symptoms No     Resting HR 79 bpm     Resting BP 152/84     Max Ex. HR 94 bpm     Max Ex. BP 152/74       Interval HR   Baseline HR 79     1 Minute HR 86     2 Minute HR 97     3 Minute HR 91     4 Minute HR 94     5 Minute HR 93     6 Minute HR 93     2 Minute Post HR 78     Interval Heart Rate? Yes       Interval Oxygen   Interval Oxygen? Yes     Baseline Oxygen Saturation % 97 %     Baseline Liters of Oxygen 0 L     1 Minute Oxygen Saturation % 95 %     1 Minute Liters of Oxygen 0 L     2 Minute Oxygen Saturation % 94 %      2 Minute Liters of Oxygen 0 L     3 Minute Oxygen Saturation % 94 %     3 Minute Liters of Oxygen 0 L     4 Minute Oxygen Saturation % 94 %     4 Minute Liters of Oxygen 0 L     5 Minute Oxygen Saturation % 95 %     5 Minute Liters of Oxygen 0 L     6 Minute Oxygen Saturation % 95 %     6 Minute Liters of Oxygen 0 L     2 Minute Post Oxygen Saturation % 97 %     2 Minute Post Liters of Oxygen 0 L        Oxygen Initial Assessment:     Oxygen Initial Assessment - 07/01/16 1101      Home Oxygen   Home Oxygen Device None     Initial 6 min Walk   Oxygen Used None     Program Oxygen Prescription   Program Oxygen Prescription None      Oxygen Re-Evaluation:   Oxygen Discharge (Final Oxygen Re-Evaluation):   Initial Exercise Prescription:     Initial Exercise Prescription - 04/30/16 1500      Date of Initial Exercise RX and Referring Provider   Date 04/30/16   Referring Provider Dr. Gwenette Greet     Bike   Level 0.7   Minutes 17     NuStep   Level 2   Minutes 17   METs 1.5     Track   Laps 10   Minutes 17     Prescription Details   Frequency (times per week) 2   Duration Progress to 45 minutes of aerobic exercise without signs/symptoms of physical distress     Intensity   THRR 40-80% of Max Heartrate 60-120   Ratings of Perceived Exertion 11-13   Perceived Dyspnea 0-4     Progression   Progression Continue progressive overload as per policy without signs/symptoms or physical distress.     Resistance Training   Training Prescription Yes   Weight blue bands   Reps 10-12      Perform Capillary Blood Glucose checks as needed.  Exercise Prescription Changes:  Exercise Prescription Changes    Row Name 05/07/16 1200 05/14/16 1200 05/16/16 1223 05/21/16 1200 06/04/16 1300     Response to Exercise   Blood Pressure (Admit) 108/68 122/64 120/70 120/74 140/70   Blood Pressure (Exercise) 144/80 130/76 124/70 134/82 110/64   Blood Pressure (Exit)  100/60 100/62 118/68 108/68  -   Heart Rate (Admit) 66 bpm 65 bpm 76 bpm 62 bpm 65 bpm   Heart Rate (Exercise) 81 bpm 85 bpm 105 bpm 88 bpm 104 bpm   Heart Rate (Exit) 72 bpm 75 bpm 70 bpm 64 bpm  -   Oxygen Saturation (Admit) 98 % 95 % 95 % 96 % 97 %   Oxygen Saturation (Exercise) 96 % 94 % 98 % 96 % 96 %   Oxygen Saturation (Exit) 96 % 95 % 97 % 96 %  -   Rating of Perceived Exertion (Exercise) _0 Perceived Dyspnea (Exercise) _1 Duration Progress to 45 minutes of aerobic exercise without signs/symptoms of physical distress Progress to 45 minutes of aerobic exercise without signs/symptoms of physical distress Progress to 45 minutes of aerobic exercise without signs/symptoms of physical distress Progress to 45 minutes of aerobic exercise without signs/symptoms of physical distress Progress to 45 minutes of aerobic exercise without signs/symptoms of physical distress   Intensity -  40-80% HRR -  40-80% HRR -  40-80% HRR THRR unchanged THRR unchanged     Progression   Progression  - Continue progressive overload as per policy without signs/symptoms or physical distress. Continue progressive overload as per policy without signs/symptoms or physical distress. Continue to progress workloads to maintain intensity without signs/symptoms of physical distress. Continue to progress workloads to maintain intensity without signs/symptoms of physical distress.     Resistance Training   Training Prescription _2    Weight blue bands blue bands blue bands blue bands  blue bands blue bands  blue bands   Reps 10-12  10 minutes of strength training 10-12  10 minutes of strength training 10-12  10 minutes of strength training 10-12  10 minutes of strength training 10-12  10 minutes of strength training     Interval Training   Interval Training _3      Bike   Level 0.7 0.7 1 1.1 1   Minutes _4 NuStep   Level 2 4  - 6 6   Minutes 17  17  - 17 17   METs 1.8 2.1  - 3 2.7     Track   Laps _5 Minutes _6 Exercise Review   Progression  - Yes Yes Yes  -   Row Name 06/06/16 1200 06/11/16 1200 06/18/16 1215 06/20/16 1241 06/27/16 1200     Response to Exercise   Blood Pressure (Admit) 104/66 120/70 96/60 104/70 124/60   Blood Pressure (Exercise) 130/74 120/70 130/60 130/80 136/74   Blood Pressure (Exit) 106/66 104/62 108/82 108/66 114/78   Heart Rate (Admit) 73 bpm 66 bpm 68 bpm 65 bpm 69 bpm   Heart Rate (Exercise) 91 bpm 99 bpm 88 bpm 88 bpm 99 bpm   Heart Rate (Exit) 70 bpm 67 bpm 76 bpm 70 bpm 81 bpm   Oxygen Saturation (Admit) 96 % 98 % 97 % 97 % 97 %   Oxygen Saturation (  Exercise) 95 % 96 % 97 % 96 % 97 %   Oxygen Saturation (Exit) 96 % 97 % 97 % 96 % 96 %   Rating of Perceived Exertion (Exercise) _0 Perceived Dyspnea (Exercise) _1 Duration Progress to 45 minutes of aerobic exercise without signs/symptoms of physical distress Progress to 45 minutes of aerobic exercise without signs/symptoms of physical distress Continue with 45 min of aerobic exercise without signs/symptoms of physical distress. Continue with 45 min of aerobic exercise without signs/symptoms of physical distress. Continue with 45 min of aerobic exercise without signs/symptoms of physical distress.   Intensity _2      Progression   Progression Continue to progress workloads to maintain intensity without signs/symptoms of physical distress. Continue to progress workloads to maintain intensity without signs/symptoms of physical distress. Continue to progress workloads to maintain intensity without signs/symptoms of physical distress. Continue to progress workloads to maintain intensity without signs/symptoms of physical distress. Continue to progress workloads to maintain intensity without signs/symptoms of physical distress.      Resistance Training   Training Prescription _3    Weight blue bands  blue bands blue bands  10 minutes of strength training blue bands blue bands blue bands   Reps 10-12  10 minutes of strength training 10-12 10-15 10-15 10-15   Time  -  - 10 Minutes 10 Minutes 10 Minutes     Interval Training   Interval Training _4      Bike   Level  - 1 1 1.3 1.1   Minutes  - _5 NuStep   Level _6 -   Minutes _7 -   METs 2.7 3.1 3.3  -  -     Track   Laps _8 - 21   Minutes _9 - 17     Exercise Review   Progression Yes  -  - Yes  -      Exercise Comments:     Exercise Comments    Row Name 05/13/16 1644 06/03/16 1309         Exercise Comments Patient has only attended one exercise session. Will cont. to monitor and progress.  Patient has attended 4 complete exercise sessions. Walking up to 22 laps in 15 minutes. Will cont. to monitor and progress.          Exercise Goals and Review:   Exercise Goals Re-Evaluation :     Exercise Goals Re-Evaluation    Row Name 07/02/16 0807             Exercise Goal Re-Evaluation   Exercise Goals Review Increase Physical Activity;Increase Strenth and Stamina       Comments Patient is progressing well in rehab. Has absences due to family issues. Open to workload increases. Will cont. to monitor and progress.        Expected Outcomes Through exercise at rehab and home patient will increase physical capacity, stength, and stamina.          Discharge Exercise Prescription (Final Exercise Prescription Changes):     Exercise Prescription Changes - 06/27/16 1200      Response to Exercise   Blood Pressure (Admit) 124/60   Blood Pressure (Exercise) 136/74   Blood Pressure (Exit) 114/78  Heart Rate (Admit) 69 bpm   Heart Rate (Exercise) 99 bpm   Heart Rate (Exit) 81 bpm   Oxygen Saturation (Admit) 97 %   Oxygen Saturation (Exercise) 97 %   Oxygen Saturation (Exit) 96  %   Rating of Perceived Exertion (Exercise) 13   Perceived Dyspnea (Exercise) 1   Duration Continue with 45 min of aerobic exercise without signs/symptoms of physical distress.   Intensity THRR unchanged     Progression   Progression Continue to progress workloads to maintain intensity without signs/symptoms of physical distress.     Resistance Training   Training Prescription Yes   Weight blue bands   Reps 10-15   Time 10 Minutes     Interval Training   Interval Training No     Bike   Level 1.1   Minutes 17     Track   Laps 21   Minutes 17      Nutrition:  Target Goals: Understanding of nutrition guidelines, daily intake of sodium <1540m, cholesterol <2035m calories 30% from fat and 7% or less from saturated fats, daily to have 5 or more servings of fruits and vegetables.  Biometrics:     Pre Biometrics - 04/26/16 1405      Pre Biometrics   Grip Strength 48 kg       Nutrition Therapy Plan and Nutrition Goals:     Nutrition Therapy & Goals - 06/18/16 1140      Nutrition Therapy   Diet General Healthful     Personal Nutrition Goals   Nutrition Goal Wt loss of 1-2 lb/week to a wt loss goal of 6-24 lb [facility] regarding individualized specific dietary modifications aiming towards targeted core components such as weight, hypertension, lipid management, diabetes, heart failure and other comorbidities.  1500 kcal, 5 day menu ideas   Expected Outcomes Short Term Goal: Understand basic principles of dietary content, such as calories, fat, sodium, cholesterol and nutrients.;Long Term Goal: Adherence to prescribed nutrition plan.      Nutrition Discharge: Rate Your Plate Scores:     Nutrition Assessments - 06/18/16 1139      Rate Your Plate Scores   Pre Score 41      Nutrition Goals Re-Evaluation:   Nutrition Goals Discharge (Final Nutrition Goals  Re-Evaluation):   Psychosocial: Target Goals: Acknowledge presence or absence of significant depression and/or stress, maximize coping skills, provide positive support system. Participant is able to verbalize types and ability to use techniques and skills needed for reducing stress and depression.  Initial Review & Psychosocial Screening:     Initial Psych Review & Screening - 04/26/16 [id number]     Barriers   Psychosocial barriers to participate in program There are no identifiable barriers or psychosocial needs.     Screening Interventions   Interventions Encouraged to exercise      Quality of Life Scores:   PHQ-9: Recent Review Flowsheet Data    [facility]   Decreased Interest 1   Down, Depressed, Hopeless 0   PHQ - 2 Score 1     Interpretation of Total Score  Total Score Depression Severity:  1-4 = Minimal depression, 5-9 = Mild depression, 10-14 = Moderate depression, 15-19 = Moderately severe depression, 20-27 = Severe depression   Psychosocial Evaluation and Intervention:  Psychosocial Evaluation - 04/26/16 1323      Psychosocial Evaluation & Interventions   Interventions Encouraged to exercise with the program and follow exercise prescription      Psychosocial Re-Evaluation:     Psychosocial Re-Evaluation    [place] Name 05/14/16 0732 06/03/16 1603 07/01/16 1101         Psychosocial Re-Evaluation   Comments no psychosocial barriers identified since admission no psychosocial barriers identified since admission no psychosocial barriers identified since admission     Interventions Encouraged to attend Pulmonary Rehabilitation for the exercise Encouraged to attend Pulmonary Rehabilitation for the exercise Encouraged to attend Pulmonary Rehabilitation for the exercise     Continue Psychosocial Services   -  - No Follow up required        Psychosocial Discharge (Final Psychosocial  Re-Evaluation):     Psychosocial Re-Evaluation - 07/01/16 1101      Psychosocial Re-Evaluation   Comments no psychosocial barriers identified since admission   Interventions Encouraged to attend Pulmonary Rehabilitation for the exercise   Continue Psychosocial Services  No Follow up required      Education: Education Goals: Education classes will be provided on a weekly basis, covering required topics. Participant will state understanding/return demonstration of topics presented.  Learning Barriers/Preferences:     Learning Barriers/Preferences - 04/26/16 1314      Learning Barriers/Preferences   Learning Barriers None   Learning Preferences Computer/Internet;Group Instruction;Individual Instruction;Skilled Demonstration;Written Material      Education Topics: Risk Factor Reduction:  -Group instruction that is supported by a PowerPoint presentation. Instructor discusses the definition of a risk factor, different risk factors for pulmonary disease, and how the heart and lungs work together.     Nutrition for Pulmonary Patient:  -Group instruction provided by PowerPoint slides, verbal discussion, and written materials to support subject matter. The instructor gives an explanation and review of healthy diet recommendations, which includes a discussion on weight management, recommendations for fruit and vegetable consumption, as well as protein, fluid, caffeine, fiber, sodium, sugar, and alcohol. Tips for eating when patients are short of breath are discussed.   Pursed Lip Breathing:  -Group instruction that is supported by demonstration and informational handouts. Instructor discusses the benefits of pursed lip and diaphragmatic breathing and detailed demonstration on how to preform both.     Oxygen Safety:  -Group instruction provided by PowerPoint, verbal discussion, and written material to support subject matter. There is an overview of "What is Oxygen" and "Why do we need  it".  Instructor also reviews how to create a safe environment for oxygen use, the importance of using oxygen as prescribed, and the risks of noncompliance. There is a brief discussion on traveling with oxygen and resources the patient may utilize.   Oxygen Equipment:  -Group instruction provided by [facility] Staff utilizing handouts, written materials, and equipment demonstrations.   Signs and Symptoms:  -Group instruction provided by written material and verbal discussion to support subject matter. Warning signs and symptoms of infection, stroke, and heart attack are reviewed and when to call the physician/911 reinforced. Tips for preventing the spread of infection discussed.   Advanced Directives:  -Group instruction provided by verbal instruction and written material to support subject matter. Instructor reviews Advanced Directive laws and proper instruction for filling out document.   Pulmonary Video:  -Group video education that reviews the importance of medication and oxygen compliance, exercise, good nutrition, pulmonary hygiene, and pursed lip and diaphragmatic breathing for the pulmonary patient.   Exercise for  the Pulmonary Patient:  -Group instruction that is supported by a PowerPoint presentation. Instructor discusses benefits of exercise, core components of exercise, frequency, duration, and intensity of an exercise routine, importance of utilizing pulse oximetry during exercise, safety while exercising, and options of places to exercise outside of rehab.   Flowsheet Row PULMONARY REHAB OTHER RESPIRATORY from 06/27/2016 in [place]  Date  06/06/16  Educator  EP  Instruction Review Code  2- meets goals/outcomes      Pulmonary Medications:  -Verbally interactive group education provided by instructor with focus on inhaled medications and proper administration. Flowsheet Row PULMONARY REHAB OTHER RESPIRATORY from 06/06/2016 in [place]  Date  05/16/16  Educator  Pharm D  Instruction Review Code  2- meets goals/outcomes      Anatomy and Physiology of the Respiratory System and Intimacy:  -Group instruction provided by PowerPoint, verbal discussion, and written material to support subject matter. Instructor reviews respiratory cycle and anatomical components of the respiratory system and their functions. Instructor also reviews differences in obstructive and restrictive respiratory diseases with examples of each. Intimacy, Sex, and Sexuality differences are reviewed with a discussion on how relationships can change when diagnosed with pulmonary disease. Common sexual concerns are reviewed. Flowsheet Row PULMONARY REHAB OTHER RESPIRATORY from 06/27/2016 in [place]  Date  06/27/16  Educator  RN  Instruction Review Code  2- meets goals/outcomes      Knowledge Questionnaire Score:   Core Components/Risk Factors/Patient Goals at Admission:     Personal Goals and Risk Factors at Admission - 04/26/16 1321      Core Components/Risk Factors/Patient Goals on Admission    Weight Management Yes;Obesity   Intervention Obesity: Provide education and appropriate resources to help participant work on and attain dietary goals.;Weight Management/Obesity: Establish reasonable short term and long term weight goals.   Expected Outcomes Short Term: Continue to assess and modify interventions until short term weight is achieved;Understanding of distribution of calorie intake throughout the day with the consumption of 4-5 meals/snacks;Weight Loss: Understanding of general recommendations for a balanced deficit meal plan, which promotes 1-2 lb weight loss per week and includes a negative energy balance of [phone] kcal/d;Understanding recommendations for meals to include 15-35% energy as protein, 25-35% energy from fat, 35-60% energy from carbohydrates, less than 259m of dietary  cholesterol, 20-35 gm of total fiber daily   Improve shortness of breath with ADL's Yes   Intervention Provide education, individualized exercise plan and daily activity instruction to help decrease symptoms of SOB with activities of daily living.   Expected Outcomes Short Term: Achieves a reduction of symptoms when performing activities of daily living.   Develop more efficient breathing techniques such as purse lipped breathing and diaphragmatic breathing; and practicing self-pacing with activity Yes   Intervention Provide education, demonstration and support about specific breathing techniuqes utilized for more efficient breathing. Include techniques such as pursed lipped breathing, diaphragmatic breathing and self-pacing activity.   Expected Outcomes Short Term: Participant will be able to demonstrate and use breathing techniques as needed throughout daily activities.      Core Components/Risk Factors/Patient Goals Review:      Goals and Risk Factor Review    Row Name 05/14/16 0731 06/03/16 1603 07/01/16 1101         Core Components/Risk Factors/Patient Goals Review   Personal Goals Review Weight Management/Obesity;Improve shortness of breath with ADL's;Increase Strength and Stamina Weight Management/Obesity;Improve shortness of breath  with ADL's;Increase Strength and Stamina Weight Management/Obesity;Develop more efficient breathing techniques such as purse lipped breathing and diaphragmatic breathing and practicing self-pacing with activity.;Improve shortness of breath with ADL's     Review see comments section on ITP see comments section on ITP see comments section on ITP     Expected Outcomes See admission expected outcomes See admission expected outcomes See admission expected outcomes        Core Components/Risk Factors/Patient Goals at Discharge (Final Review):      Goals and Risk Factor Review - 07/01/16 1101      Core Components/Risk Factors/Patient Goals Review   Personal  Goals Review Weight Management/Obesity;Develop more efficient breathing techniques such as purse lipped breathing and diaphragmatic breathing and practicing self-pacing with activity.;Improve shortness of breath with ADL's   Review see comments section on ITP   Expected Outcomes See admission expected outcomes      ITP Comments:   Comments: ITP REVIEW Pt is making expected progress toward pulmonary rehab goals after completing 10 sessions. He is slowly loosing weight. His admission weight was 101.7 and at his last visit he was 100.4. He has to be reminded to purse lip breath but it is less frequent. He is tolerating increases in his workloads and states his stamina and strength are improving. Recommend continued exercise, life style modification, education, and utilization of breathing techniques to increase stamina and strength and decrease shortness of breath with exertion.

## 2016-07-02 NOTE — Progress Notes (Signed)
Daily Session Note  Patient Details  Name: MAVERIC DEBONO MRN: [id number] Date of Birth: 1945-05-17 Referring Provider:   April Manson Pulmonary Rehab Walk Test from 04/30/2016 in [place]  Referring Provider  Dr. Gwenette Greet      Encounter Date: 07/02/2016  Check In:     Session Check In - 07/02/16 1232      Check-In   Location [facility]   Staff Present Su Hilt, MS, ACSM RCEP, Exercise Physiologist;Olinty Irvington, MS, ACSM CEP, Exercise Physiologist;Maria Whitaker, RN, Roque Cash, RN   Supervising physician immediately available to respond to emergencies [facility] immediately available   Physician(s) Dr Maylene Roes   Medication changes reported     No   Fall or balance concerns reported    No   Tobacco Cessation No Change   Warm-up and Cool-down Performed as group-led instruction   Resistance Training Performed Yes   VAD Patient? No     Pain Assessment   Currently in Pain? No/denies   Multiple Pain Sites No      Capillary Blood Glucose: No results found for this or any previous visit (from the past 24 hour(s)).      Exercise Prescription Changes - 07/02/16 1246      Response to Exercise   Blood Pressure (Admit) 114/62   Blood Pressure (Exercise) 142/80   Blood Pressure (Exit) 118/72   Heart Rate (Admit) 66 bpm   Heart Rate (Exercise) 87 bpm   Heart Rate (Exit) 74 bpm   Oxygen Saturation (Admit) 99 %   Oxygen Saturation (Exercise) 97 %   Oxygen Saturation (Exit) 95 %   Rating of Perceived Exertion (Exercise) 12   Perceived Dyspnea (Exercise) 2   Duration Continue with 45 min of aerobic exercise without signs/symptoms of physical distress.   Intensity THRR unchanged     Progression   Progression Continue to progress workloads to maintain intensity without signs/symptoms of physical distress.     Resistance Training   Training Prescription Yes   Weight blue bands   Reps 10-15   Time 10  Minutes     Interval Training   Interval Training No     Bike   Level 1.3   Minutes 17     NuStep   Level 7   Minutes 17   METs 3.4     Track   Laps 19   Minutes 17      History  Smoking Status  . Former Smoker  . Packs/day: 1.00  . Years: 40.00  . Types: Cigarettes  . Quit date: 01/01/2012  Smokeless Tobacco  . Never Used    Comment: 10 cigs per day    Goals Met:  Independence with exercise equipment Improved SOB with ADL's Using PLB without cueing & demonstrates good technique Exercise tolerated well No report of cardiac concerns or symptoms Strength training completed today  Goals Unmet:  Not Applicable  Comments: Service time is from 1030 to 1210   Dr. Rush Farmer is Medical Director for Pulmonary Rehab at [facility]

## 2016-07-04 ENCOUNTER — Encounter (HOSPITAL_COMMUNITY)
Admission: RE | Admit: 2016-07-04 | Discharge: 2016-07-04 | Disposition: A | Discharge status: Home / Self Care | Payer: Non-veteran care | Source: Ambulatory Visit | Attending: Pulmonary Disease | Admitting: Pulmonary Disease

## 2016-07-04 VITALS — Wt 222.2 lb

## 2016-07-04 DIAGNOSIS — J439 Emphysema, unspecified: Secondary | ICD-10-CM | POA: Diagnosis not present

## 2016-07-04 NOTE — Progress Notes (Signed)
Daily Session Note  Patient Details  Name: Arthur Harper MRN: [id number] Date of Birth: November 12, 1945 Referring Provider:     Pulmonary Rehab Walk Test from 04/30/2016 in [place]  Referring Provider  Dr. Gwenette Greet      Encounter Date: 07/04/2016  Check In:     Session Check In - 07/04/16 1028      Check-In   Location [facility]   Staff Present Trish Fountain, RN, Maxcine Ham, RN, BSN;Lisa Hughes, RN;Glynn Freas, MS, ACSM RCEP, Exercise Physiologist   Supervising physician immediately available to respond to emergencies [facility] immediately available   Physician(s) Dr. Sloan Leiter   Medication changes reported     No   Fall or balance concerns reported    No   Tobacco Cessation No Change   Warm-up and Cool-down Performed as group-led instruction   Resistance Training Performed Yes   VAD Patient? No     Pain Assessment   Currently in Pain? No/denies   Multiple Pain Sites No      Capillary Blood Glucose: No results found for this or any previous visit (from the past 24 hour(s)).      Exercise Prescription Changes - 07/04/16 1200      Response to Exercise   Blood Pressure (Admit) 94/50   Blood Pressure (Exercise) 150/70   Blood Pressure (Exit) 128/80   Heart Rate (Admit) 62 bpm   Heart Rate (Exercise) 84 bpm   Heart Rate (Exit) 76 bpm   Oxygen Saturation (Admit) 97 %   Oxygen Saturation (Exercise) 98 %   Oxygen Saturation (Exit) 98 %   Rating of Perceived Exertion (Exercise) 12   Perceived Dyspnea (Exercise) 1   Duration Continue with 45 min of aerobic exercise without signs/symptoms of physical distress.   Intensity THRR unchanged     Progression   Progression Continue to progress workloads to maintain intensity without signs/symptoms of physical distress.     Resistance Training   Training Prescription Yes   Weight blue bands   Reps 10-15   Time 10 Minutes     Interval Training   Interval  Training No     NuStep   Level 7   Minutes 17   METs 3.3     Track   Laps 25   Minutes 17      History  Smoking Status  . Former Smoker  . Packs/day: 1.00  . Years: 40.00  . Types: Cigarettes  . Quit date: 01/01/2012  Smokeless Tobacco  . Never Used    Comment: 10 cigs per day    Goals Met:  Exercise tolerated well No report of cardiac concerns or symptoms Strength training completed today  Goals Unmet:  Not Applicable  Comments: Service time is from 10:30A to 12:45P    Dr. Rush Farmer is Medical Director for Pulmonary Rehab at [facility].

## 2016-07-09 ENCOUNTER — Encounter (HOSPITAL_COMMUNITY)
Admission: RE | Admit: 2016-07-09 | Discharge: 2016-07-09 | Disposition: A | Discharge status: Home / Self Care | Payer: Non-veteran care | Source: Ambulatory Visit | Attending: Pulmonary Disease | Admitting: Pulmonary Disease

## 2016-07-09 VITALS — Wt 219.1 lb

## 2016-07-09 DIAGNOSIS — J439 Emphysema, unspecified: Secondary | ICD-10-CM

## 2016-07-09 NOTE — Progress Notes (Signed)
Daily Session Note  Patient Details  Name: Arthur Harper MRN: [id number] Date of Birth: 02/28/1946 Referring Provider:     Pulmonary Rehab Walk Test from 04/30/2016 in [place]  Referring Provider  Dr. Gwenette Greet      Encounter Date: 07/09/2016  Check In:     Session Check In - 07/09/16 1212      Check-In   [facility]   Staff Present Su Hilt, MS, ACSM RCEP, Exercise Physiologist;Joan Leonia Reeves, RN, Luisa Hart, RN, Roque Cash, RN   Supervising physician immediately available to respond to emergencies [facility] immediately available   Physician(s) Dr. Candiss Norse   Medication changes reported     No   Fall or balance concerns reported    No   Tobacco Cessation No Change   Warm-up and Cool-down Performed as group-led instruction   Resistance Training Performed Yes   VAD Patient? No     Pain Assessment   Currently in Pain? No/denies   Multiple Pain Sites No      Capillary Blood Glucose: No results found for this or any previous visit (from the past 24 hour(s)).      Exercise Prescription Changes - 07/09/16 1220      Response to Exercise   Blood Pressure (Admit) 124/74   Blood Pressure (Exercise) 150/80   Blood Pressure (Exit) 114/72   Heart Rate (Admit) 68 bpm   Heart Rate (Exercise) 105 bpm   Oxygen Saturation (Admit) 98 %   Oxygen Saturation (Exercise) 96 %   Rating of Perceived Exertion (Exercise) 12   Perceived Dyspnea (Exercise) 2   Duration Continue with 45 min of aerobic exercise without signs/symptoms of physical distress.   Intensity THRR unchanged     Progression   Progression Continue to progress workloads to maintain intensity without signs/symptoms of physical distress.     Resistance Training   Training Prescription Yes   Weight blue bands   Reps 10-15   Time 10 Minutes     Interval Training   Interval Training No     Bike   Level 1.3   Minutes 17     NuStep   Level 7   Minutes 17   METs 4.2     Track   Laps 22   Minutes 17      History  Smoking Status  . Former Smoker  . Packs/day: 1.00  . Years: 40.00  . Types: Cigarettes  . Quit date: 01/01/2012  Smokeless Tobacco  . Never Used    Comment: 10 cigs per day    Goals Met:  Independence with exercise equipment Improved SOB with ADL's Using PLB without cueing & demonstrates good technique Exercise tolerated well No report of cardiac concerns or symptoms Strength training completed today  Goals Unmet:  Not Applicable  Comments: Service time is from 1030 to 1200   Dr. Rush Farmer is Medical Director for Pulmonary Rehab at [facility].

## 2016-07-11 ENCOUNTER — Encounter (HOSPITAL_COMMUNITY)
Admission: RE | Admit: 2016-07-11 | Discharge: 2016-07-11 | Disposition: A | Discharge status: Home / Self Care | Payer: Non-veteran care | Source: Ambulatory Visit | Attending: Pulmonary Disease | Admitting: Pulmonary Disease

## 2016-07-11 VITALS — Wt 218.3 lb

## 2016-07-11 DIAGNOSIS — J439 Emphysema, unspecified: Secondary | ICD-10-CM

## 2016-07-11 NOTE — Progress Notes (Signed)
Daily Session Note  Patient Details  Name: Arthur Harper MRN: [id number] Date of Birth: 12-12-1945 Referring Provider:     Pulmonary Rehab Walk Test from 04/30/2016 in [place]  Referring Provider  Dr. Gwenette Greet      Encounter Date: 07/11/2016  Check In:     Session Check In - 07/11/16 1030      Check-In   Location [facility]   Staff Present Su Hilt, MS, ACSM RCEP, Exercise Physiologist;Rosser Collington Ysidro Evert, RN;Portia Rollene Rotunda, RN, BSN   Supervising physician immediately available to respond to emergencies [facility] immediately available   Physician(s) Dr. Allyson Sabal   Medication changes reported     No   Fall or balance concerns reported    No   Tobacco Cessation No Change   Warm-up and Cool-down Performed as group-led instruction   Resistance Training Performed Yes   VAD Patient? No     Pain Assessment   Currently in Pain? No/denies   Multiple Pain Sites No      Capillary Blood Glucose: No results found for this or any previous visit (from the past 24 hour(s)).      Exercise Prescription Changes - 07/11/16 1200      Response to Exercise   Blood Pressure (Admit) 120/72   Blood Pressure (Exercise) 142/78   Blood Pressure (Exit) 92/66   Heart Rate (Admit) 68 bpm   Heart Rate (Exercise) 98 bpm   Heart Rate (Exit) 75 bpm   Oxygen Saturation (Admit) 98 %   Oxygen Saturation (Exercise) 95 %   Oxygen Saturation (Exit) 96 %   Rating of Perceived Exertion (Exercise) 12   Perceived Dyspnea (Exercise) 1   Duration Continue with 45 min of aerobic exercise without signs/symptoms of physical distress.   Intensity THRR unchanged     Progression   Progression Continue to progress workloads to maintain intensity without signs/symptoms of physical distress.     Resistance Training   Training Prescription Yes   Weight blue bands   Reps 10-15   Time 10 Minutes     Interval Training   Interval Training No     Bike    Level 1.3   Minutes 17     Track   Laps 21   Minutes 17      History  Smoking Status  . Former Smoker  . Packs/day: 1.00  . Years: 40.00  . Types: Cigarettes  . Quit date: 01/01/2012  Smokeless Tobacco  . Never Used    Comment: 10 cigs per day    Goals Met:  Exercise tolerated well No report of cardiac concerns or symptoms Strength training completed today  Goals Unmet:  Not Applicable  Comments: Service time is from 1030 to 1220     Dr. Rush Farmer is Medical Director for Pulmonary Rehab at [facility].

## 2016-07-16 ENCOUNTER — Encounter (HOSPITAL_COMMUNITY)
Admission: RE | Admit: 2016-07-16 | Discharge: 2016-07-16 | Disposition: A | Discharge status: Home / Self Care | Payer: Non-veteran care | Source: Ambulatory Visit | Attending: Pulmonary Disease | Admitting: Pulmonary Disease

## 2016-07-16 VITALS — Wt 217.2 lb

## 2016-07-16 DIAGNOSIS — J439 Emphysema, unspecified: Secondary | ICD-10-CM | POA: Diagnosis not present

## 2016-07-16 NOTE — Progress Notes (Signed)
Daily Session Note  Patient Details  Name: Arthur Harper MRN: [id number] Date of Birth: 12/06/1945 Referring Provider:     Pulmonary Rehab Walk Test from 04/30/2016 in [place]  Referring Provider  Dr. Gwenette Greet      Encounter Date: 07/16/2016  Check In:     Session Check In - 07/16/16 1030      Check-In   Location [facility]   Staff Present Rosebud Poles, RN, BSN;Molly diVincenzo, MS, ACSM RCEP, Exercise Physiologist;Annedrea Rosezella [place], RN, MHA;Portia Rollene Rotunda, RN, BSN   Supervising physician immediately available to respond to emergencies [facility] immediately available   Physician(s) Dr. Tana Coast   Medication changes reported     No   Fall or balance concerns reported    No   Tobacco Cessation No Change   Warm-up and Cool-down Performed as group-led instruction   Resistance Training Performed Yes   VAD Patient? No     Pain Assessment   Currently in Pain? No/denies   Multiple Pain Sites No      Capillary Blood Glucose: No results found for this or any previous visit (from the past 24 hour(s)).      Exercise Prescription Changes - 07/16/16 1200      Response to Exercise   Blood Pressure (Admit) 114/70   Blood Pressure (Exercise) 122/74   Blood Pressure (Exit) 114/70   Heart Rate (Admit) 72 bpm   Heart Rate (Exercise) 96 bpm   Heart Rate (Exit) 71 bpm   Oxygen Saturation (Admit) 96 %   Oxygen Saturation (Exercise) 98 %   Oxygen Saturation (Exit) 99 %   Rating of Perceived Exertion (Exercise) 11   Perceived Dyspnea (Exercise) 1   Duration Continue with 45 min of aerobic exercise without signs/symptoms of physical distress.   Intensity THRR unchanged     Progression   Progression Continue to progress workloads to maintain intensity without signs/symptoms of physical distress.     Resistance Training   Training Prescription Yes   Weight blue bands   Reps 10-15   Time 10 Minutes     Interval Training   Interval Training No     Bike   Level 1.3   Minutes 17     NuStep   Level 7   Minutes 17     Track   Laps 21   Minutes 17      History  Smoking Status  . Former Smoker  . Packs/day: 1.00  . Years: 40.00  . Types: Cigarettes  . Quit date: 01/01/2012  Smokeless Tobacco  . Never Used    Comment: 10 cigs per day    Goals Met:  Proper associated with RPD/PD & O2 Sat Independence with exercise equipment Improved SOB with ADL's Exercise tolerated well Strength training completed today  Goals Unmet:  Not Applicable  Comments: Service time is from 1030 to 1210    Dr. Rush Farmer is Medical Director for Pulmonary Rehab at [facility].

## 2016-07-18 ENCOUNTER — Encounter (HOSPITAL_COMMUNITY)
Admission: RE | Admit: 2016-07-18 | Discharge: 2016-07-18 | Disposition: A | Discharge status: Home / Self Care | Payer: Non-veteran care | Source: Ambulatory Visit | Attending: Pulmonary Disease | Admitting: Pulmonary Disease

## 2016-07-18 VITALS — Wt 218.3 lb

## 2016-07-18 DIAGNOSIS — J439 Emphysema, unspecified: Secondary | ICD-10-CM

## 2016-07-18 NOTE — Progress Notes (Signed)
Daily Session Note  Patient Details  Name: Arthur Harper MRN: [id number] Date of Birth: 03-Aug-1945 Referring Provider:     Pulmonary Rehab Walk Test from 04/30/2016 in [place]  Referring Provider  Dr. Gwenette Greet      Encounter Date: 07/18/2016  Check In:     Session Check In - 07/18/16 1018      Check-In   Location [facility]   Staff Present Rosebud Poles, RN, BSN;Lisa Ysidro Evert, RN;Verbena Boeding, MS, ACSM RCEP, Exercise Physiologist   Supervising physician immediately available to respond to emergencies [facility] immediately available   Physician(s) Dr. Sloan Leiter   Medication changes reported     No   Fall or balance concerns reported    No   Tobacco Cessation No Change   Warm-up and Cool-down Performed as group-led instruction   Resistance Training Performed Yes   VAD Patient? No     Pain Assessment   Currently in Pain? No/denies   Multiple Pain Sites No      Capillary Blood Glucose: No results found for this or any previous visit (from the past 24 hour(s)).      Exercise Prescription Changes - 07/18/16 1300      Response to Exercise   Blood Pressure (Admit) 110/64   Blood Pressure (Exercise) 130/70   Blood Pressure (Exit) 100/50   Heart Rate (Admit) 64 bpm   Heart Rate (Exercise) 85 bpm   Heart Rate (Exit) 68 bpm   Oxygen Saturation (Admit) 97 %   Oxygen Saturation (Exercise) 96 %   Oxygen Saturation (Exit) 96 %   Rating of Perceived Exertion (Exercise) 12   Perceived Dyspnea (Exercise) 1   Duration Continue with 45 min of aerobic exercise without signs/symptoms of physical distress.   Intensity THRR unchanged     Progression   Progression Continue to progress workloads to maintain intensity without signs/symptoms of physical distress.     Resistance Training   Training Prescription Yes   Weight blue bands   Reps 10-15   Time 10 Minutes     Interval Training   Interval Training No     Bike   Level 1.3   Minutes 17     NuStep   Level 7   Minutes 17   METs 4.2      History  Smoking Status  . Former Smoker  . Packs/day: 1.00  . Years: 40.00  . Types: Cigarettes  . Quit date: 01/01/2012  Smokeless Tobacco  . Never Used    Comment: 10 cigs per day    Goals Met:  Exercise tolerated well No report of cardiac concerns or symptoms Strength training completed today  Goals Unmet:  Not Applicable  Comments: Service time is from 10:30A to 12:20P    Dr. Rush Farmer is Medical Director for Pulmonary Rehab at [facility].

## 2016-07-23 ENCOUNTER — Encounter (HOSPITAL_COMMUNITY)
Admission: RE | Admit: 2016-07-23 | Discharge: 2016-07-23 | Disposition: A | Discharge status: Home / Self Care | Payer: Non-veteran care | Source: Ambulatory Visit | Attending: Pulmonary Disease | Admitting: Pulmonary Disease

## 2016-07-23 VITALS — Wt 218.7 lb

## 2016-07-23 DIAGNOSIS — J439 Emphysema, unspecified: Secondary | ICD-10-CM | POA: Diagnosis present

## 2016-07-23 NOTE — Progress Notes (Signed)
Daily Session Note  Patient Details  Name: Arthur Harper MRN: [id number] Date of Birth: 11/10/1945 Referring Provider:     Pulmonary Rehab Walk Test from 04/30/2016 in [place]  Referring Provider  Dr. Gwenette Greet      Encounter Date: 07/23/2016  Check In:     Session Check In - 07/23/16 1101      Check-In   Location [facility]   Staff Present Su Hilt, MS, ACSM RCEP, Exercise Physiologist;Gerarda Conklin Ysidro Evert, RN;Joann Rion, RN, Luisa Hart, RN, BSN   Supervising physician immediately available to respond to emergencies [facility] immediately available   Physician(s) Dr. Doyle Askew   Medication changes reported     No   Fall or balance concerns reported    No   Tobacco Cessation No Change   Warm-up and Cool-down Performed as group-led instruction   Resistance Training Performed Yes   VAD Patient? No     Pain Assessment   Currently in Pain? No/denies   Multiple Pain Sites No      Capillary Blood Glucose: No results found for this or any previous visit (from the past 24 hour(s)).      Exercise Prescription Changes - 07/23/16 1200      Response to Exercise   Blood Pressure (Admit) 100/72   Blood Pressure (Exercise) 118/82   Blood Pressure (Exit) 94/60   Heart Rate (Admit) 59 bpm   Heart Rate (Exercise) 88 bpm   Heart Rate (Exit) 72 bpm   Oxygen Saturation (Admit) 99 %   Oxygen Saturation (Exercise) 96 %   Oxygen Saturation (Exit) 97 %   Rating of Perceived Exertion (Exercise) 12   Perceived Dyspnea (Exercise) 1   Duration Continue with 45 min of aerobic exercise without signs/symptoms of physical distress.   Intensity THRR unchanged     Progression   Progression Continue to progress workloads to maintain intensity without signs/symptoms of physical distress.     Resistance Training   Training Prescription Yes   Weight blue bands   Reps 10-15   Time 10 Minutes     Interval Training   Interval  Training No     Bike   Level 1.3   Minutes 17     NuStep   Level 7   Minutes 17   METs 3.6     Track   Laps 20   Minutes 17      History  Smoking Status  . Former Smoker  . Packs/day: 1.00  . Years: 40.00  . Types: Cigarettes  . Quit date: 01/01/2012  Smokeless Tobacco  . Never Used    Comment: 10 cigs per day    Goals Met:  Exercise tolerated well No report of cardiac concerns or symptoms Strength training completed today  Goals Unmet:  Not Applicable  Comments: Service time is from 1030 to 1200    Dr. Rush Farmer is Medical Director for Pulmonary Rehab at [facility].

## 2016-07-23 NOTE — Progress Notes (Signed)
I have reviewed a Home Exercise Prescription with Arthur Harper . Arthur Harper is not currently exercising at home.  The patient was advised to walk 2-3 days a week for 30 minutes.  Arthur Harper and I discussed how to progress their exercise prescription.  The patient stated that their goals were to lose ~30lbs.  The patient stated that they understand the exercise prescription.  We reviewed exercise guidelines, target heart rate during exercise, oxygen use, weather, home pulse oximeter, endpoints for exercise, and goals.  Patient is encouraged to come to me with any questions. I will continue to follow up with the patient to assist them with progression and safety.

## 2016-07-25 ENCOUNTER — Encounter (HOSPITAL_COMMUNITY)
Admission: RE | Admit: 2016-07-25 | Discharge: 2016-07-25 | Disposition: A | Discharge status: Home / Self Care | Payer: Non-veteran care | Source: Ambulatory Visit | Attending: Pulmonary Disease | Admitting: Pulmonary Disease

## 2016-07-25 VITALS — Wt 218.0 lb

## 2016-07-25 DIAGNOSIS — J439 Emphysema, unspecified: Secondary | ICD-10-CM

## 2016-07-25 NOTE — Progress Notes (Signed)
Daily Session Note  Patient Details  Name: Arthur Harper MRN: [id number] Date of Birth: 1946-04-17 Referring Provider:     Pulmonary Rehab Walk Test from 04/30/2016 in [place]  Referring Provider  Dr. Gwenette Greet      Encounter Date: 07/25/2016  Check In:     Session Check In - 07/25/16 1056      Check-In   [facility]   Staff Present Su Hilt, MS, ACSM RCEP, Exercise Physiologist;Jeselle Hiser Ysidro Evert, RN;Portia Rollene Rotunda, RN, BSN   Supervising physician immediately available to respond to emergencies [facility] immediately available   Physician(s) Dr. Allyson Sabal   Medication changes reported     No   Fall or balance concerns reported    No   Tobacco Cessation No Change   Warm-up and Cool-down Performed as group-led instruction   Resistance Training Performed Yes   VAD Patient? No     Pain Assessment   Currently in Pain? No/denies   Multiple Pain Sites No      Capillary Blood Glucose: No results found for this or any previous visit (from the past 24 hour(s)).      Exercise Prescription Changes - 07/25/16 1300      Response to Exercise   Blood Pressure (Admit) 112/70   Blood Pressure (Exercise) 124/60   Blood Pressure (Exit) 110/60   Heart Rate (Admit) 61 bpm   Heart Rate (Exercise) 110 bpm   Heart Rate (Exit) 74 bpm   Oxygen Saturation (Admit) 98 %   Oxygen Saturation (Exercise) 98 %   Oxygen Saturation (Exit) 95 %   Rating of Perceived Exertion (Exercise) 12   Perceived Dyspnea (Exercise) 2   Duration Continue with 45 min of aerobic exercise without signs/symptoms of physical distress.   Intensity THRR unchanged     Progression   Progression Continue to progress workloads to maintain intensity without signs/symptoms of physical distress.     Resistance Training   Training Prescription Yes   Weight blue bands   Reps 10-15   Time 10 Minutes     Interval Training   Interval Training No     Bike    Level 1.7   Minutes 17     Track   Laps 26   Minutes 17     Exercise Review   Progression Yes      History  Smoking Status  . Former Smoker  . Packs/day: 1.00  . Years: 40.00  . Types: Cigarettes  . Quit date: 01/01/2012  Smokeless Tobacco  . Never Used    Comment: 10 cigs per day    Goals Met:  Exercise tolerated well No report of cardiac concerns or symptoms Strength training completed today  Goals Unmet:  Not Applicable  Comments: Service time is from 1030 to 1240. Dr. Rush Farmer is Medical Director for Pulmonary Rehab at [facility].

## 2016-07-30 ENCOUNTER — Encounter (HOSPITAL_COMMUNITY)
Admission: RE | Admit: 2016-07-30 | Discharge: 2016-07-30 | Disposition: A | Discharge status: Home / Self Care | Payer: Non-veteran care | Source: Ambulatory Visit | Attending: Pulmonary Disease | Admitting: Pulmonary Disease

## 2016-07-30 VITALS — Wt 219.8 lb

## 2016-07-30 DIAGNOSIS — J439 Emphysema, unspecified: Secondary | ICD-10-CM | POA: Diagnosis not present

## 2016-07-30 NOTE — Progress Notes (Signed)
Pulmonary Individual Treatment Plan  Patient Details  Name: Arthur Harper MRN: [id number] Date of Birth: [birth year] Referring Provider:     Pulmonary Rehab Walk Test from 04/30/2016 in [place]  Referring Provider  Dr. Gwenette Greet      Initial Encounter Date:    Pulmonary Rehab Walk Test from 04/30/2016 in [facility]  Date  04/30/16  Referring Provider  Dr. Gwenette Greet      Visit Diagnosis: Pulmonary emphysema, unspecified emphysema type ([place])  Patient's Home Medications on Admission:   Current Outpatient Prescriptions:  .  albuterol (PROVENTIL HFA;VENTOLIN HFA) 108 (90 BASE) MCG/ACT inhaler, Inhale 2 puffs into the lungs every 6 (six) hours as needed., Disp: , Rfl:  .  aspirin 81 MG chewable tablet, Chew 81 mg by mouth daily., Disp: , Rfl:  .  budesonide-formoterol (SYMBICORT) 160-4.5 MCG/ACT inhaler, Inhale 2 puffs into the lungs 2 (two) times daily., Disp: , Rfl:  .  Multiple Vitamin (MULTIVITAMIN) capsule, Take 1 capsule by mouth daily., Disp: , Rfl:  .  terazosin (HYTRIN) 5 MG capsule, Take 5 mg by mouth at bedtime., Disp: , Rfl:  .  tiotropium (SPIRIVA) 18 MCG inhalation capsule, Place 18 mcg into inhaler and inhale daily., Disp: , Rfl:   Past Medical History: Past Medical History:  Diagnosis Date  . Agent orange exposure   . COPD (chronic obstructive pulmonary disease) ([place])   . PTSD (post-traumatic stress disorder)   . Sleep apnea     Tobacco Use: History  Smoking Status  . Former Smoker  . Packs/day: 1.00  . Years: 40.00  . Types: Cigarettes  . Quit date: 01/01/2012  Smokeless Tobacco  . Never Used    Comment: 10 cigs per day    Labs: Recent Review Flowsheet Data    Labs for ITP Cardiac and Pulmonary Rehab Latest Ref Rng & Units 12/25/2007 08/14/2012   Cholestrol 0 - 200 mg/dL 171 165   LDLCALC 0 - 99 mg/dL 105(H) 98   HDL >39.00 mg/dL 55.4 54.10   Trlycerides 0.0 - 149.0 mg/dL 52 65.0       Capillary Blood Glucose: No results found for: GLUCAP   ADL UCSD:   Pulmonary Function Assessment:     Pulmonary Function Assessment - 04/26/16 1314      Breath   Bilateral Breath Sounds Decreased  bases, clear uppers   Shortness of Breath Yes  mild shortness of breath sometimes limits strenuous activity      Exercise Target Goals:    Exercise Program Goal: Individual exercise prescription set with THRR, safety & activity barriers. Participant demonstrates ability to understand and report RPE using BORG scale, to self-measure pulse accurately, and to acknowledge the importance of the exercise prescription.  Exercise Prescription Goal: Starting with aerobic activity 30 plus minutes a day, 3 days per week for initial exercise prescription. Provide home exercise prescription and guidelines that participant acknowledges understanding prior to discharge.  Activity Barriers & Risk Stratification:     Activity Barriers & Cardiac Risk Stratification - 04/26/16 1313      Activity Barriers & Cardiac Risk Stratification   Activity Barriers Deconditioning;Shortness of Breath      6 Minute Walk:     6 Minute Walk    Row Name 04/30/16 1544         6 Minute Walk   Phase Initial     Distance 1515 feet     Walk Time 6 minutes     #  of Rest Breaks 0     MPH 2.86     METS 3.14     RPE 12     Perceived Dyspnea  1     Symptoms No     Resting HR 79 bpm     Resting BP 152/84     Max Ex. HR 94 bpm     Max Ex. BP 152/74       Interval HR   Baseline HR 79     1 Minute HR 86     2 Minute HR 97     3 Minute HR 91     4 Minute HR 94     5 Minute HR 93     6 Minute HR 93     2 Minute Post HR 78     Interval Heart Rate? Yes       Interval Oxygen   Interval Oxygen? Yes     Baseline Oxygen Saturation % 97 %     Baseline Liters of Oxygen 0 L     1 Minute Oxygen Saturation % 95 %     1 Minute Liters of Oxygen 0 L     2 Minute Oxygen Saturation % 94 %     2 Minute  Liters of Oxygen 0 L     3 Minute Oxygen Saturation % 94 %     3 Minute Liters of Oxygen 0 L     4 Minute Oxygen Saturation % 94 %     4 Minute Liters of Oxygen 0 L     5 Minute Oxygen Saturation % 95 %     5 Minute Liters of Oxygen 0 L     6 Minute Oxygen Saturation % 95 %     6 Minute Liters of Oxygen 0 L     2 Minute Post Oxygen Saturation % 97 %     2 Minute Post Liters of Oxygen 0 L        Oxygen Initial Assessment:     Oxygen Initial Assessment - 07/01/16 1101      Home Oxygen   Home Oxygen Device None     Initial 6 min Walk   Oxygen Used None     Program Oxygen Prescription   Program Oxygen Prescription None      Oxygen Re-Evaluation:     Oxygen Re-Evaluation    Row Name 07/30/16 0713             Program Oxygen Prescription   Program Oxygen Prescription None         Home Oxygen   Home Oxygen Device None          Oxygen Discharge (Final Oxygen Re-Evaluation):     Oxygen Re-Evaluation - 07/30/16 0713      Program Oxygen Prescription   Program Oxygen Prescription None     Home Oxygen   Home Oxygen Device None      Initial Exercise Prescription:     Initial Exercise Prescription - 04/30/16 1500      Date of Initial Exercise RX and Referring Provider   Date 04/30/16   Referring Provider Dr. Clance     Bike   Level 0.7   Minutes 17     NuStep   Level 2   Minutes 17   METs 1.5     Track   Laps 10   Minutes 17     Prescription Details   Frequency (times per week) 2     Duration Progress to 45 minutes of aerobic exercise without signs/symptoms of physical distress     Intensity   THRR 40-80% of Max Heartrate 60-120   Ratings of Perceived Exertion 11-13   Perceived Dyspnea 0-4     Progression   Progression Continue progressive overload as per policy without signs/symptoms or physical distress.     Resistance Training   Training Prescription Yes   Weight blue bands   Reps 10-12      Perform Capillary Blood Glucose checks  as needed.  Exercise Prescription Changes:     Exercise Prescription Changes    Row Name 05/07/16 1200 05/14/16 1200 05/16/16 1223 05/21/16 1200 06/04/16 1300     Response to Exercise   Blood Pressure (Admit) 108/68 122/64 120/70 120/74 140/70   Blood Pressure (Exercise) 144/80 130/76 124/70 134/82 110/64   Blood Pressure (Exit) 100/60 100/62 118/68 108/68  -   Heart Rate (Admit) 66 bpm 65 bpm 76 bpm 62 bpm 65 bpm   Heart Rate (Exercise) 81 bpm 85 bpm 105 bpm 88 bpm 104 bpm   Heart Rate (Exit) 72 bpm 75 bpm 70 bpm 64 bpm  -   Oxygen Saturation (Admit) 98 % 95 % 95 % 96 % 97 %   Oxygen Saturation (Exercise) 96 % 94 % 98 % 96 % 96 %   Oxygen Saturation (Exit) 96 % 95 % 97 % 96 %  -   Rating of Perceived Exertion (Exercise) _0 Perceived Dyspnea (Exercise) _1 Duration Progress to 45 minutes of aerobic exercise without signs/symptoms of physical distress Progress to 45 minutes of aerobic exercise without signs/symptoms of physical distress Progress to 45 minutes of aerobic exercise without signs/symptoms of physical distress Progress to 45 minutes of aerobic exercise without signs/symptoms of physical distress Progress to 45 minutes of aerobic exercise without signs/symptoms of physical distress   Intensity -  40-80% HRR -  40-80% HRR -  40-80% HRR THRR unchanged THRR unchanged     Progression   Progression  - Continue progressive overload as per policy without signs/symptoms or physical distress. Continue progressive overload as per policy without signs/symptoms or physical distress. Continue to progress workloads to maintain intensity without signs/symptoms of physical distress. Continue to progress workloads to maintain intensity without signs/symptoms of physical distress.     Resistance Training   Training Prescription _2    Weight blue bands blue bands blue bands blue bands  blue bands blue bands  blue bands   Reps 10-12  10 minutes of  strength training 10-12  10 minutes of strength training 10-12  10 minutes of strength training 10-12  10 minutes of strength training 10-12  10 minutes of strength training     Interval Training   Interval Training _3      Bike   Level 0.7 0.7 1 1.1 1   Minutes _4 NuStep   Level 2 4  - 6 6   Minutes 17 17  - 17 17   METs 1.8 2.1  - 3 2.7     Track   Laps _5 Minutes _6 Exercise Review   Progression  - Yes Yes Yes  -   Row Name 06/06/16 1200 06/11/16 1200 06/18/16 1215 06/20/16 1241 06/27/16 1200  Response to Exercise   Blood Pressure (Admit) 104/66 120/70 96/60 104/70 124/60   Blood Pressure (Exercise) 130/74 120/70 130/60 130/80 136/74   Blood Pressure (Exit) 106/66 104/62 108/82 108/66 114/78   Heart Rate (Admit) 73 bpm 66 bpm 68 bpm 65 bpm 69 bpm   Heart Rate (Exercise) 91 bpm 99 bpm 88 bpm 88 bpm 99 bpm   Heart Rate (Exit) 70 bpm 67 bpm 76 bpm 70 bpm 81 bpm   Oxygen Saturation (Admit) 96 % 98 % 97 % 97 % 97 %   Oxygen Saturation (Exercise) 95 % 96 % 97 % 96 % 97 %   Oxygen Saturation (Exit) 96 % 97 % 97 % 96 % 96 %   Rating of Perceived Exertion (Exercise) 12 11 12 12 13   Perceived Dyspnea (Exercise) 2 1 1 1 1   Duration Progress to 45 minutes of aerobic exercise without signs/symptoms of physical distress Progress to 45 minutes of aerobic exercise without signs/symptoms of physical distress Continue with 45 min of aerobic exercise without signs/symptoms of physical distress. Continue with 45 min of aerobic exercise without signs/symptoms of physical distress. Continue with 45 min of aerobic exercise without signs/symptoms of physical distress.   Intensity THRR unchanged THRR unchanged THRR unchanged THRR unchanged THRR unchanged     Progression   Progression Continue to progress workloads to maintain intensity without signs/symptoms of physical distress. Continue to progress workloads to maintain intensity  without signs/symptoms of physical distress. Continue to progress workloads to maintain intensity without signs/symptoms of physical distress. Continue to progress workloads to maintain intensity without signs/symptoms of physical distress. Continue to progress workloads to maintain intensity without signs/symptoms of physical distress.     Resistance Training   Training Prescription Yes Yes Yes Yes Yes   Weight blue bands  blue bands blue bands  10 minutes of strength training blue bands blue bands blue bands   Reps 10-12  10 minutes of strength training 10-12 10-15 10-15 10-15   Time  -  - 10 Minutes 10 Minutes 10 Minutes     Interval Training   Interval Training No No No No No     Bike   Level  - 1 1 1.3 1.1   Minutes  - 17 17 17 17     NuStep   Level 6 6 6 7  -   Minutes 17 17 17 17  -   METs 2.7 3.1 3.3  -  -     Track   Laps 21 18 22  - 21   Minutes 17 17 17  - 17     Exercise Review   Progression Yes  -  - Yes  -   Row Name 07/02/16 1246 07/04/16 1200 07/09/16 1220 07/11/16 1200 07/16/16 1200     Response to Exercise   Blood Pressure (Admit) 114/62 94/50 124/74 120/72 114/70   Blood Pressure (Exercise) 142/80 150/70 150/80 142/78 122/74   Blood Pressure (Exit) 118/72 128/80 114/72 92/66 114/70   Heart Rate (Admit) 66 bpm 62 bpm 68 bpm 68 bpm 72 bpm   Heart Rate (Exercise) 87 bpm 84 bpm 105 bpm 98 bpm 96 bpm   Heart Rate (Exit) 74 bpm 76 bpm  - 75 bpm 71 bpm   Oxygen Saturation (Admit) 99 % 97 % 98 % 98 % 96 %   Oxygen Saturation (Exercise) 97 % 98 % 96 % 95 % 98 %   Oxygen Saturation (Exit) 95 % 98 %  -   96 % 99 %   Rating of Perceived Exertion (Exercise) 12 12 12 12 11   Perceived Dyspnea (Exercise) 2 1 2 1 1   Duration Continue with 45 min of aerobic exercise without signs/symptoms of physical distress. Continue with 45 min of aerobic exercise without signs/symptoms of physical distress. Continue with 45 min of aerobic exercise without signs/symptoms of physical  distress. Continue with 45 min of aerobic exercise without signs/symptoms of physical distress. Continue with 45 min of aerobic exercise without signs/symptoms of physical distress.   Intensity THRR unchanged THRR unchanged THRR unchanged THRR unchanged THRR unchanged     Progression   Progression Continue to progress workloads to maintain intensity without signs/symptoms of physical distress. Continue to progress workloads to maintain intensity without signs/symptoms of physical distress. Continue to progress workloads to maintain intensity without signs/symptoms of physical distress. Continue to progress workloads to maintain intensity without signs/symptoms of physical distress. Continue to progress workloads to maintain intensity without signs/symptoms of physical distress.     Resistance Training   Training Prescription Yes Yes Yes Yes Yes   Weight blue bands blue bands blue bands blue bands blue bands   Reps 10-15 10-15 10-15 10-15 10-15   Time 10 Minutes 10 Minutes 10 Minutes 10 Minutes 10 Minutes     Interval Training   Interval Training No No No No No     Bike   Level 1.3  - 1.3 1.3 1.3   Minutes 17  - 17 17 17     NuStep   Level 7 7 7  - 7   Minutes 17 17 17  - 17   METs 3.4 3.3 4.2  -  -     Track   Laps 19 25 22 21 21   Minutes 17 17 17 17 17   Row Name 07/18/16 1300 07/23/16 1200 07/25/16 1300         Response to Exercise   Blood Pressure (Admit) 110/64 100/72 112/70     Blood Pressure (Exercise) 130/70 118/82 124/60     Blood Pressure (Exit) 100/50 94/60 110/60     Heart Rate (Admit) 64 bpm 59 bpm 61 bpm     Heart Rate (Exercise) 85 bpm 88 bpm 110 bpm     Heart Rate (Exit) 68 bpm 72 bpm 74 bpm     Oxygen Saturation (Admit) 97 % 99 % 98 %     Oxygen Saturation (Exercise) 96 % 96 % 98 %     Oxygen Saturation (Exit) 96 % 97 % 95 %     Rating of Perceived Exertion (Exercise) 12 12 12     Perceived Dyspnea (Exercise) 1 1 2     Duration Continue with 45 min of  aerobic exercise without signs/symptoms of physical distress. Continue with 45 min of aerobic exercise without signs/symptoms of physical distress. Continue with 45 min of aerobic exercise without signs/symptoms of physical distress.     Intensity THRR unchanged THRR unchanged THRR unchanged       Progression   Progression Continue to progress workloads to maintain intensity without signs/symptoms of physical distress. Continue to progress workloads to maintain intensity without signs/symptoms of physical distress. Continue to progress workloads to maintain intensity without signs/symptoms of physical distress.       Resistance Training   Training Prescription Yes Yes Yes     Weight blue bands blue bands blue bands     Reps 10-15 10-15 10-15     Time 10   Minutes 10 Minutes 10 Minutes       Interval Training   Interval Training No No No       Bike   Level 1.3 1.3 1.7     Minutes _0 NuStep   Level 7 7  -     Minutes 17 17  -     METs 4.2 3.6  -       Track   Laps  - 20 26     Minutes  - 17 17       Home Exercise Plan   Plans to continue exercise at  - Home (comment)  -     Frequency  - Add 3 additional days to program exercise sessions.  -       Exercise Review   Progression  -  - Yes        Exercise Comments:     Exercise Comments    Row Name 05/13/16 1644 06/03/16 1309 07/23/16 1306       Exercise Comments Patient has only attended one exercise session. Will cont. to monitor and progress.  Patient has attended 4 complete exercise sessions. Walking up to 22 laps in 15 minutes. Will cont. to monitor and progress.  Home Exercise Prescription completed        Exercise Goals and Review:   Exercise Goals Re-Evaluation :     Exercise Goals Re-Evaluation    Row Name 07/02/16 0807 07/29/16 1618           Exercise Goal Re-Evaluation   Exercise Goals Review Increase Physical Activity;Increase Strenth and Stamina Increase Physical Activity;Increase Strenth  and Stamina      Comments Patient is progressing well in rehab. Has absences due to family issues. Open to workload increases. Will cont. to monitor and progress.  As of lately, patient is beginning to push himself. He walked 26 laps which is equal to one mile in 15 minutes. Open to workload changes. Averages over 4 METS on stepper. Will cont. to monitor and progress.       Expected Outcomes Through exercise at rehab and home patient will increase physical capacity, stength, and stamina. Through exercise at rehab and home patient will increase physical capacity, stength, and stamina.         Discharge Exercise Prescription (Final Exercise Prescription Changes):     Exercise Prescription Changes - 07/25/16 1300      Response to Exercise   Blood Pressure (Admit) 112/70   Blood Pressure (Exercise) 124/60   Blood Pressure (Exit) 110/60   Heart Rate (Admit) 61 bpm   Heart Rate (Exercise) 110 bpm   Heart Rate (Exit) 74 bpm   Oxygen Saturation (Admit) 98 %   Oxygen Saturation (Exercise) 98 %   Oxygen Saturation (Exit) 95 %   Rating of Perceived Exertion (Exercise) 12   Perceived Dyspnea (Exercise) 2   Duration Continue with 45 min of aerobic exercise without signs/symptoms of physical distress.   Intensity THRR unchanged     Progression   Progression Continue to progress workloads to maintain intensity without signs/symptoms of physical distress.     Resistance Training   Training Prescription Yes   Weight blue bands   Reps 10-15   Time 10 Minutes     Interval Training   Interval Training No     Bike   Level 1.7   Minutes [place] 26   Minutes  17     Exercise Review   Progression Yes      Nutrition:  Target Goals: Understanding of nutrition guidelines, daily intake of sodium <1520m, cholesterol <2071m calories 30% from fat and 7% or less from saturated fats, daily to have 5 or more servings of fruits and vegetables.  Biometrics:     Pre Biometrics -  04/26/16 1405      Pre Biometrics   Grip Strength 48 kg       Nutrition Therapy Plan and Nutrition Goals:     Nutrition Therapy & Goals - 06/18/16 1140      Nutrition Therapy   Diet General Healthful     Personal Nutrition Goals   Nutrition Goal Wt loss of 1-2 lb/week to a wt loss goal of 6-24 lb [facility] and counsel regarding individualized specific dietary modifications aiming towards targeted core components such as weight, hypertension, lipid management, diabetes, heart failure and other comorbidities.  1500 kcal, 5 day menu ideas   Expected Outcomes Short Term Goal: Understand basic principles of dietary content, such as calories, fat, sodium, cholesterol and nutrients.;Long Term Goal: Adherence to prescribed nutrition plan.      Nutrition Discharge: Rate Your Plate Scores:     Nutrition Assessments - 06/18/16 1139      Rate Your Plate Scores   Pre Score 41      Nutrition Goals Re-Evaluation:   Nutrition Goals Discharge (Final Nutrition Goals Re-Evaluation):   Psychosocial: Target Goals: Acknowledge presence or absence of significant depression and/or stress, maximize coping skills, provide positive support system. Participant is able to verbalize types and ability to use techniques and skills needed for reducing stress and depression.  Initial Review & Psychosocial Screening:     Initial Psych Review & Screening - 04/26/16 13[place]onNo     Barriers   Psychosocial barriers to participate in program There are no identifiable barriers or psychosocial needs.     Screening Interventions   Interventions Encouraged to exercise      Quality of Life Scores:   PHQ-9: Recent Review Flowsheet Data    [facility]   Decreased Interest 1   Down, Depressed, Hopeless 0   PHQ - 2 Score 1     Interpretation of Total  Score  Total Score Depression Severity:  1-4 = Minimal depression, 5-9 = Mild depression, 10-14 = Moderate depression, 15-19 = Moderately severe depression, 20-27 = Severe depression   Psychosocial Evaluation and Intervention:     Psychosocial Evaluation - 04/26/16 1323      Psychosocial Evaluation & Interventions   Interventions Encouraged to exercise with the program and follow exercise prescription      Psychosocial Re-Evaluation:     Psychosocial Re-Evaluation    [place] 05/14/16 0732 06/03/16 1603 07/01/16 1101 07/30/16 0714       Psychosocial Re-Evaluation   Current issues with  -  -  - None Identified    Comments no psychosocial barriers identified since admission no psychosocial barriers identified since admission no psychosocial barriers identified since admission no psychosocial barriers identified since admission    Interventions Encouraged to attend Pulmonary Rehabilitation for the exercise Encouraged to attend Pulmonary Rehabilitation for the exercise Encouraged to attend Pulmonary Rehabilitation for the exercise Encouraged to attend Pulmonary Rehabilitation for the exercise    Continue Psychosocial Services   -  -  No Follow up required No Follow up required       Psychosocial Discharge (Final Psychosocial Re-Evaluation):     Psychosocial Re-Evaluation - 07/30/16 0714      Psychosocial Re-Evaluation   Current issues with None Identified   Comments no psychosocial barriers identified since admission   Interventions Encouraged to attend Pulmonary Rehabilitation for the exercise   Continue Psychosocial Services  No Follow up required      Education: Education Goals: Education classes will be provided on a weekly basis, covering required topics. Participant will state understanding/return demonstration of topics presented.  Learning Barriers/Preferences:     Learning Barriers/Preferences - 04/26/16 1314      Learning Barriers/Preferences   Learning  Barriers None   Learning Preferences Computer/Internet;Group Instruction;Individual Instruction;Skilled Demonstration;Written Material      Education Topics: Risk Factor Reduction:  -Group instruction that is supported by a PowerPoint presentation. Instructor discusses the definition of a risk factor, different risk factors for pulmonary disease, and how the heart and lungs work together.     PULMONARY REHAB OTHER RESPIRATORY from 07/25/2016 in [facility]  Date  07/11/16  Educator  EP  Instruction Review Code  2- meets goals/outcomes      Nutrition for Pulmonary Patient:  -Group instruction provided by PowerPoint slides, verbal discussion, and written materials to support subject matter. The instructor gives an explanation and review of healthy diet recommendations, which includes a discussion on weight management, recommendations for fruit and vegetable consumption, as well as protein, fluid, caffeine, fiber, sodium, sugar, and alcohol. Tips for eating when patients are short of breath are discussed.   PULMONARY REHAB OTHER RESPIRATORY from 07/25/2016 in [facility]  Date  07/04/16  Educator  RD  Instruction Review Code  2- meets goals/outcomes      Pursed Lip Breathing:  -Group instruction that is supported by demonstration and informational handouts. Instructor discusses the benefits of pursed lip and diaphragmatic breathing and detailed demonstration on how to preform both.     Oxygen Safety:  -Group instruction provided by PowerPoint, verbal discussion, and written material to support subject matter. There is an overview of "What is Oxygen" and "Why do we need it".  Instructor also reviews how to create a safe environment for oxygen use, the importance of using oxygen as prescribed, and the risks of noncompliance. There is a brief discussion on traveling with oxygen and resources the patient may utilize.   PULMONARY REHAB  OTHER RESPIRATORY from 07/25/2016 in [facility]  Date  07/25/16  Educator  [place]  Instruction Review Code  2- meets goals/outcomes      Oxygen Equipment:  -Group instruction provided by Home Health Staff utilizing handouts, written materials, and equipment demonstrations.   Signs and Symptoms:  -Group instruction provided by written material and verbal discussion to support subject matter. Warning signs and symptoms of infection, stroke, and heart attack are reviewed and when to call the physician/911 reinforced. Tips for preventing the spread of infection discussed.   Advanced Directives:  -Group instruction provided by verbal instruction and written material to support subject matter. Instructor reviews Advanced Directive laws and proper instruction for filling out document.   Pulmonary Video:  -Group video education that reviews the importance of medication and oxygen compliance, exercise, good nutrition, pulmonary hygiene, and pursed lip and diaphragmatic breathing for the pulmonary patient.   Exercise for the Pulmonary Patient:  -Group instruction that is supported by a   PowerPoint presentation. Instructor discusses benefits of exercise, core components of exercise, frequency, duration, and intensity of an exercise routine, importance of utilizing pulse oximetry during exercise, safety while exercising, and options of places to exercise outside of rehab.     PULMONARY REHAB OTHER RESPIRATORY from 07/25/2016 in [place]  Date  06/06/16  Educator  EP  Instruction Review Code  2- meets goals/outcomes      Pulmonary Medications:  -Verbally interactive group education provided by instructor with focus on inhaled medications and proper administration.   PULMONARY REHAB OTHER RESPIRATORY from 07/25/2016 in [place]  Date  07/18/16  Educator  Pharm D  Instruction Review Code  2- meets  goals/outcomes      Anatomy and Physiology of the Respiratory System and Intimacy:  -Group instruction provided by PowerPoint, verbal discussion, and written material to support subject matter. Instructor reviews respiratory cycle and anatomical components of the respiratory system and their functions. Instructor also reviews differences in obstructive and restrictive respiratory diseases with examples of each. Intimacy, Sex, and Sexuality differences are reviewed with a discussion on how relationships can change when diagnosed with pulmonary disease. Common sexual concerns are reviewed.   PULMONARY REHAB OTHER RESPIRATORY from 07/25/2016 in [place]  Date  06/27/16  Educator  RN  Instruction Review Code  2- meets goals/outcomes      Knowledge Questionnaire Score:   Core Components/Risk Factors/Patient Goals at Admission:     Personal Goals and Risk Factors at Admission - 04/26/16 1321      Core Components/Risk Factors/Patient Goals on Admission    Weight Management Yes;Obesity   Intervention Obesity: Provide education and appropriate resources to help participant work on and attain dietary goals.;Weight Management/Obesity: Establish reasonable short term and long term weight goals.   Expected Outcomes Short Term: Continue to assess and modify interventions until short term weight is achieved;Understanding of distribution of calorie intake throughout the day with the consumption of 4-5 meals/snacks;Weight Loss: Understanding of general recommendations for a balanced deficit meal plan, which promotes 1-2 lb weight loss per week and includes a negative energy balance of [phone] kcal/d;Understanding recommendations for meals to include 15-35% energy as protein, 25-35% energy from fat, 35-60% energy from carbohydrates, less than 262m of dietary cholesterol, 20-35 gm of total fiber daily   Improve shortness of breath with ADL's Yes   Intervention Provide  education, individualized exercise plan and daily activity instruction to help decrease symptoms of SOB with activities of daily living.   Expected Outcomes Short Term: Achieves a reduction of symptoms when performing activities of daily living.   Develop more efficient breathing techniques such as purse lipped breathing and diaphragmatic breathing; and practicing self-pacing with activity Yes   Intervention Provide education, demonstration and support about specific breathing techniuqes utilized for more efficient breathing. Include techniques such as pursed lipped breathing, diaphragmatic breathing and self-pacing activity.   Expected Outcomes Short Term: Participant will be able to demonstrate and use breathing techniques as needed throughout daily activities.      Core Components/Risk Factors/Patient Goals Review:      Goals and Risk Factor Review    Row Name 05/14/16 0731 06/03/16 1603 07/01/16 1101 07/30/16 0714       Core Components/Risk Factors/Patient Goals Review   Personal Goals Review Weight Management/Obesity;Improve shortness of breath with ADL's;Increase Strength and Stamina Weight Management/Obesity;Improve shortness of breath with ADL's;Increase Strength and Stamina Weight Management/Obesity;Develop more efficient breathing techniques  such as purse lipped breathing and diaphragmatic breathing and practicing self-pacing with activity.;Improve shortness of breath with ADL's Weight Management/Obesity;Develop more efficient breathing techniques such as purse lipped breathing and diaphragmatic breathing and practicing self-pacing with activity.;Improve shortness of breath with ADL's    Review see comments section on ITP see comments section on ITP see comments section on ITP see comments section on ITP    Expected Outcomes See admission expected outcomes See admission expected outcomes See admission expected outcomes See admission expected outcomes       Core Components/Risk  Factors/Patient Goals at Discharge (Final Review):      Goals and Risk Factor Review - 07/30/16 0714      Core Components/Risk Factors/Patient Goals Review   Personal Goals Review Weight Management/Obesity;Develop more efficient breathing techniques such as purse lipped breathing and diaphragmatic breathing and practicing self-pacing with activity.;Improve shortness of breath with ADL's   Review see comments section on ITP   Expected Outcomes See admission expected outcomes      ITP Comments:   Comments: ITP REVIEW Pt is making expected progress toward pulmonary rehab goals after completing 18 sessions. Recommend continued exercise, life style modification, education, and utilization of breathing techniques to increase stamina and strength and decrease shortness of breath with exertion.

## 2016-07-30 NOTE — Progress Notes (Signed)
Daily Session Note  Patient Details  Name: Arthur Harper MRN: [id number] Date of Birth: 1946/04/18 Referring Provider:     Pulmonary Rehab Walk Test from 04/30/2016 in [place]  Referring Provider  Dr. Gwenette Greet      Encounter Date: 07/30/2016  Check In:     Session Check In - 07/30/16 1216      Check-In   Location [facility]   Staff Present Trish Fountain, RN, Maxcine Ham, RN, BSN;Molly diVincenzo, MS, ACSM RCEP, Exercise Physiologist;Verdun Rackley Ysidro Evert, RN   Supervising physician immediately available to respond to emergencies [facility] immediately available   Physician(s) Dr. Ree Kida   Medication changes reported     No   Fall or balance concerns reported    No   Tobacco Cessation No Change   Warm-up and Cool-down Performed as group-led instruction   Resistance Training Performed Yes   VAD Patient? No     Pain Assessment   Currently in Pain? No/denies   Multiple Pain Sites No      Capillary Blood Glucose: No results found for this or any previous visit (from the past 24 hour(s)).      Exercise Prescription Changes - 07/30/16 1200      Response to Exercise   Blood Pressure (Admit) 126/70   Blood Pressure (Exercise) 142/62   Blood Pressure (Exit) 118/60   Heart Rate (Admit) 68 bpm   Heart Rate (Exercise) 102 bpm   Heart Rate (Exit) 73 bpm   Oxygen Saturation (Admit) 97 %   Oxygen Saturation (Exercise) 96 %   Oxygen Saturation (Exit) 98 %   Rating of Perceived Exertion (Exercise) 13   Perceived Dyspnea (Exercise) 2   Duration Continue with 45 min of aerobic exercise without signs/symptoms of physical distress.   Intensity THRR unchanged     Progression   Progression Continue to progress workloads to maintain intensity without signs/symptoms of physical distress.     Resistance Training   Training Prescription Yes   Weight blue bands   Reps 10-15   Time 10 Minutes     Interval Training   Interval Training No     Bike   Level 1.7   Minutes 17     NuStep   Level 7   Minutes 17     Track   Laps 28   Minutes 17      History  Smoking Status  . Former Smoker  . Packs/day: 1.00  . Years: 40.00  . Types: Cigarettes  . Quit date: 01/01/2012  Smokeless Tobacco  . Never Used    Comment: 10 cigs per day    Goals Met:  Exercise tolerated well No report of cardiac concerns or symptoms Strength training completed today  Goals Unmet:  Not Applicable  Comments: Service time is from 1030 to 1205    Dr. Rush Farmer is Medical Director for Pulmonary Rehab at [facility].

## 2016-08-01 ENCOUNTER — Encounter (HOSPITAL_COMMUNITY)
Admission: RE | Admit: 2016-08-01 | Discharge: 2016-08-01 | Disposition: A | Discharge status: Home / Self Care | Payer: Non-veteran care | Source: Ambulatory Visit | Attending: Pulmonary Disease | Admitting: Pulmonary Disease

## 2016-08-01 VITALS — Wt 219.1 lb

## 2016-08-01 DIAGNOSIS — J439 Emphysema, unspecified: Secondary | ICD-10-CM | POA: Diagnosis not present

## 2016-08-01 NOTE — Progress Notes (Signed)
Daily Session Note  Patient Details  Name: TRENDEN HAZELRIGG MRN: [id number] Date of Birth: 02-20-1946 Referring Provider:     Pulmonary Rehab Walk Test from 04/30/2016 in [place]  Referring Provider  Dr. Gwenette Greet      Encounter Date: 08/01/2016  Check In:     Session Check In - 08/01/16 1030      Check-In   [facility]   Staff Present Rosebud Poles, RN, BSN;Molly diVincenzo, MS, ACSM RCEP, Exercise Physiologist;Theron Cumbie Ysidro Evert, RN;Portia Rollene Rotunda, RN, BSN   Supervising physician immediately available to respond to emergencies [facility] immediately available   Physician(s) Dr. Cathlean Sauer   Medication changes reported     No   Fall or balance concerns reported    No   Tobacco Cessation No Change   Warm-up and Cool-down Performed as group-led instruction   Resistance Training Performed Yes   VAD Patient? No     Pain Assessment   Currently in Pain? No/denies   Multiple Pain Sites No      Capillary Blood Glucose: No results found for this or any previous visit (from the past 24 hour(s)).      Exercise Prescription Changes - 08/01/16 1200      Response to Exercise   Blood Pressure (Admit) 110/62   Blood Pressure (Exercise) 138/60   Blood Pressure (Exit) 110/60   Heart Rate (Admit) 61 bpm   Heart Rate (Exercise) 112 bpm   Heart Rate (Exit) 70 bpm   Oxygen Saturation (Admit) 97 %   Oxygen Saturation (Exercise) 97 %   Oxygen Saturation (Exit) 97 %   Rating of Perceived Exertion (Exercise) 12   Perceived Dyspnea (Exercise) 1   Duration Continue with 45 min of aerobic exercise without signs/symptoms of physical distress.   Intensity THRR unchanged     Progression   Progression Continue to progress workloads to maintain intensity without signs/symptoms of physical distress.     Resistance Training   Training Prescription Yes   Weight blue bands   Reps 10-15   Time 10 Minutes     Interval Training   Interval  Training No     Bike   Level 1.7   Minutes 17     NuStep   Level 7   Minutes 17   METs 3.5      History  Smoking Status  . Former Smoker  . Packs/day: 1.00  . Years: 40.00  . Types: Cigarettes  . Quit date: 01/01/2012  Smokeless Tobacco  . Never Used    Comment: 10 cigs per day    Goals Met:  Exercise tolerated well No report of cardiac concerns or symptoms Strength training completed today  Goals Unmet:  Not Applicable  Comments: Service time is from 1030 to 1205    Dr. Rush Farmer is Medical Director for Pulmonary Rehab at [facility].

## 2016-08-06 ENCOUNTER — Encounter (HOSPITAL_COMMUNITY)
Admission: RE | Admit: 2016-08-06 | Discharge: 2016-08-06 | Disposition: A | Discharge status: Home / Self Care | Payer: Non-veteran care | Source: Ambulatory Visit | Attending: Pulmonary Disease | Admitting: Pulmonary Disease

## 2016-08-06 VITALS — Wt 218.7 lb

## 2016-08-06 DIAGNOSIS — J439 Emphysema, unspecified: Secondary | ICD-10-CM

## 2016-08-06 NOTE — Progress Notes (Signed)
Daily Session Note  Patient Details  Name: Arthur Harper MRN: [id number] Date of Birth: 21-Mar-1946 Referring Provider:     Pulmonary Rehab Walk Test from 04/30/2016 in [place]  Referring Provider  Dr. Gwenette Greet      Encounter Date: 08/06/2016  Check In:     Session Check In - 08/06/16 1233      Check-In   [facility]   Staff Present Trish Fountain, RN, BSN;Molly diVincenzo, MS, ACSM RCEP, Exercise Physiologist;Rosalene Wardrop Ysidro Evert, RN;Joan Leonia Reeves, RN, BSN   Supervising physician immediately available to respond to emergencies [facility] immediately available   Physician(s) Dr. Cathlean Sauer   Medication changes reported     No   Fall or balance concerns reported    No   Tobacco Cessation No Change   Warm-up and Cool-down Performed as group-led instruction   Resistance Training Performed Yes   VAD Patient? No     Pain Assessment   Currently in Pain? No/denies   Multiple Pain Sites No      Capillary Blood Glucose: No results found for this or any previous visit (from the past 24 hour(s)).      Exercise Prescription Changes - 08/06/16 1200      Response to Exercise   Blood Pressure (Admit) 112/68   Blood Pressure (Exercise) 148/80   Blood Pressure (Exit) 104/60   Heart Rate (Admit) 62 bpm   Heart Rate (Exercise) 110 bpm   Heart Rate (Exit) 71 bpm   Oxygen Saturation (Admit) 98 %   Oxygen Saturation (Exercise) 95 %   Oxygen Saturation (Exit) 96 %   Rating of Perceived Exertion (Exercise) 13   Perceived Dyspnea (Exercise) 2   Duration Continue with 45 min of aerobic exercise without signs/symptoms of physical distress.   Intensity THRR unchanged     Progression   Progression Continue to progress workloads to maintain intensity without signs/symptoms of physical distress.     Resistance Training   Training Prescription Yes   Weight blue bands   Reps 10-15   Time 10 Minutes     Interval Training   Interval  Training No     Bike   Level 1.7   Minutes 17     NuStep   Level 7   Minutes 17   METs 3.7     Track   Laps 32   Minutes 17      History  Smoking Status  . Former Smoker  . Packs/day: 1.00  . Years: 40.00  . Types: Cigarettes  . Quit date: 01/01/2012  Smokeless Tobacco  . Never Used    Comment: 10 cigs per day    Goals Met:  Exercise tolerated well No report of cardiac concerns or symptoms Strength training completed today  Goals Unmet:  Not Applicable  Comments: Service time is from 1030 to 1210     Dr. Rush Farmer is Medical Director for Pulmonary Rehab at [facility].

## 2016-08-08 ENCOUNTER — Encounter (HOSPITAL_COMMUNITY)
Admission: RE | Admit: 2016-08-08 | Discharge: 2016-08-08 | Disposition: A | Discharge status: Home / Self Care | Payer: Non-veteran care | Source: Ambulatory Visit | Attending: Pulmonary Disease | Admitting: Pulmonary Disease

## 2016-08-08 VITALS — Wt 218.5 lb

## 2016-08-08 DIAGNOSIS — J439 Emphysema, unspecified: Secondary | ICD-10-CM

## 2016-08-08 NOTE — Progress Notes (Signed)
Daily Session Note  Patient Details  Name: Arthur Harper MRN: [id number] Date of Birth: 09-May-1945 Referring Provider:     Pulmonary Rehab Walk Test from 04/30/2016 in [place]  Referring Provider  Dr. Gwenette Greet      Encounter Date: 08/08/2016  Check In:     Session Check In - 08/08/16 1030      Check-In   [facility]   Staff Present Trish Fountain, RN, BSN;Molly diVincenzo, MS, ACSM RCEP, Exercise Physiologist;Joan Leonia Reeves, RN, BSN   Supervising physician immediately available to respond to emergencies [facility] immediately available   Physician(s) Dr. Candiss Norse   Medication changes reported     No   Fall or balance concerns reported    No   Tobacco Cessation No Change   Warm-up and Cool-down Performed as group-led instruction   Resistance Training Performed Yes   VAD Patient? No     Pain Assessment   Currently in Pain? No/denies   Multiple Pain Sites No      Capillary Blood Glucose: No results found for this or any previous visit (from the past 24 hour(s)).      Exercise Prescription Changes - 08/08/16 1249      Response to Exercise   Blood Pressure (Admit) 100/54   Blood Pressure (Exercise) 126/60   Blood Pressure (Exit) 120/60   Heart Rate (Admit) 62 bpm   Heart Rate (Exercise) 90 bpm   Heart Rate (Exit) 71 bpm   Oxygen Saturation (Admit) 97 %   Oxygen Saturation (Exercise) 96 %   Oxygen Saturation (Exit) 95 %   Rating of Perceived Exertion (Exercise) 12   Perceived Dyspnea (Exercise) 2   Duration Continue with 45 min of aerobic exercise without signs/symptoms of physical distress.   Intensity THRR unchanged     Progression   Progression Continue to progress workloads to maintain intensity without signs/symptoms of physical distress.     Resistance Training   Training Prescription Yes   Weight blue bands   Reps 10-15   Time 10 Minutes     Interval Training   Interval Training No     NuStep   Level 7   Minutes 17   METs 3.5     Track   Laps 26   Minutes 17      History  Smoking Status  . Former Smoker  . Packs/day: 1.00  . Years: 40.00  . Types: Cigarettes  . Quit date: 01/01/2012  Smokeless Tobacco  . Never Used    Comment: 10 cigs per day    Goals Met:  Independence with exercise equipment Improved SOB with ADL's Using PLB without cueing & demonstrates good technique Changing diet to healthy choices, watching portion sizes Exercise tolerated well No report of cardiac concerns or symptoms Strength training completed today  Goals Unmet:  Not Applicable  Comments: Service time is from 1030 to 1230   Dr. Rush Farmer is Medical Director for Pulmonary Rehab at [facility].

## 2016-08-13 ENCOUNTER — Encounter (HOSPITAL_COMMUNITY)
Admission: RE | Admit: 2016-08-13 | Discharge: 2016-08-13 | Disposition: A | Discharge status: Home / Self Care | Payer: Non-veteran care | Source: Ambulatory Visit | Attending: Pulmonary Disease | Admitting: Pulmonary Disease

## 2016-08-13 VITALS — Wt 219.1 lb

## 2016-08-13 DIAGNOSIS — J439 Emphysema, unspecified: Secondary | ICD-10-CM

## 2016-08-13 NOTE — Progress Notes (Signed)
Daily Session Note  Patient Details  Name: Arthur Harper MRN: [id number] Date of Birth: [birth year] Referring Provider:     Pulmonary Rehab Walk Test from 04/30/2016 in [place]  Referring Provider  Dr. Gwenette Greet      Encounter Date: 08/13/2016  Check In:     Session Check In - 08/13/16 1024      Check-In   [facility]   Staff Present Trish Fountain, RN, BSN;Molly diVincenzo, MS, ACSM RCEP, Exercise Physiologist;Joan Leonia Reeves, RN, Roque Cash, RN   Supervising physician immediately available to respond to emergencies [facility] immediately available   Physician(s) Dr. Candiss Norse   Medication changes reported     No   Fall or balance concerns reported    No   Tobacco Cessation No Change   Warm-up and Cool-down Performed as group-led instruction   Resistance Training Performed Yes   VAD Patient? No     Pain Assessment   Currently in Pain? No/denies   Multiple Pain Sites No      Capillary Blood Glucose: No results found for this or any previous visit (from the past 24 hour(s)).      Exercise Prescription Changes - 08/13/16 1224      Response to Exercise   Blood Pressure (Admit) 100/54   Blood Pressure (Exercise) 130/64   Blood Pressure (Exit) 110/64   Heart Rate (Admit) 76 bpm   Heart Rate (Exercise) 75 bpm   Heart Rate (Exit) 64 bpm   Oxygen Saturation (Admit) 96 %   Oxygen Saturation (Exercise) 96 %   Oxygen Saturation (Exit) 97 %   Rating of Perceived Exertion (Exercise) 12   Perceived Dyspnea (Exercise) 1.5   Duration Continue with 45 min of aerobic exercise without signs/symptoms of physical distress.   Intensity THRR unchanged     Progression   Progression Continue to progress workloads to maintain intensity without signs/symptoms of physical distress.     Resistance Training   Training Prescription Yes   Weight blue bands   Reps 10-15   Time 10 Minutes     Interval Training   Interval  Training No     Bike   Level 1.7   Minutes 17     NuStep   Level 7   Minutes 17   METs 2.8     Track   Laps 22   Minutes 17      History  Smoking Status  . Former Smoker  . Packs/day: 1.00  . Years: 40.00  . Types: Cigarettes  . Quit date: 01/01/2012  Smokeless Tobacco  . Never Used    Comment: 10 cigs per day    Goals Met:  Independence with exercise equipment Improved SOB with ADL's Using PLB without cueing & demonstrates good technique Exercise tolerated well No report of cardiac concerns or symptoms Strength training completed today  Goals Unmet:  Not Applicable  Comments: Service time is from 1030 to 1215   Dr. Rush Farmer is Medical Director for Pulmonary Rehab at [facility].

## 2016-08-15 ENCOUNTER — Encounter (HOSPITAL_COMMUNITY)
Admission: RE | Admit: 2016-08-15 | Discharge: 2016-08-15 | Disposition: A | Discharge status: Home / Self Care | Payer: Non-veteran care | Source: Ambulatory Visit | Attending: Pulmonary Disease | Admitting: Pulmonary Disease

## 2016-08-15 VITALS — Wt 220.2 lb

## 2016-08-15 DIAGNOSIS — J439 Emphysema, unspecified: Secondary | ICD-10-CM

## 2016-08-15 NOTE — Progress Notes (Addendum)
Daily Session Note  Patient Details  Name: Arthur Harper MRN: [id number] Date of Birth: 1946-04-22 Referring Provider:     Pulmonary Rehab Walk Test from 04/30/2016 in [place]  Referring Provider  Dr. Gwenette Greet      Encounter Date: 08/15/2016  Check In:     Session Check In - 08/15/16 1104      Check-In   Location [facility]   Staff Present Rosebud Poles, RN, BSN;Lisa Ysidro Evert, RN;Yosgar Demirjian Rollene Rotunda, RN, BSN;Molly diVincenzo, MS, ACSM RCEP, Exercise Physiologist   Supervising physician immediately available to respond to emergencies [facility] immediately available   Physician(s) Dr. Posey Pronto   Medication changes reported     No   Fall or balance concerns reported    No   Tobacco Cessation No Change   Warm-up and Cool-down Performed as group-led instruction   Resistance Training Performed Yes   VAD Patient? No     Pain Assessment   Currently in Pain? No/denies   Multiple Pain Sites No      Capillary Blood Glucose: No results found for this or any previous visit (from the past 24 hour(s)).      Exercise Prescription Changes - 08/15/16 1244      Response to Exercise   Blood Pressure (Admit) 126/58   Blood Pressure (Exercise) 150/76   Blood Pressure (Exit) 130/80   Heart Rate (Admit) 65 bpm   Heart Rate (Exercise) 95 bpm   Heart Rate (Exit) 88 bpm   Oxygen Saturation (Admit) 97 %   Oxygen Saturation (Exercise) 96 %   Oxygen Saturation (Exit) 95 %   Rating of Perceived Exertion (Exercise) 12   Perceived Dyspnea (Exercise) 1.5   Duration Continue with 45 min of aerobic exercise without signs/symptoms of physical distress.   Intensity THRR unchanged     Progression   Progression Continue to progress workloads to maintain intensity without signs/symptoms of physical distress.     Resistance Training   Training Prescription Yes   Weight blue bands   Reps 10-15   Time 10 Minutes     Interval Training   Interval  Training No     Bike   Level 1.7   Minutes 17     Track   Laps 23   Minutes 17      History  Smoking Status  . Former Smoker  . Packs/day: 1.00  . Years: 40.00  . Types: Cigarettes  . Quit date: 01/01/2012  Smokeless Tobacco  . Never Used    Comment: 10 cigs per day    Goals Met:  Independence with exercise equipment Improved SOB with ADL's Using PLB without cueing & demonstrates good technique Exercise tolerated well No report of cardiac concerns or symptoms Strength training completed today  Goals Unmet:  Not Applicable  Comments: Service time is from 1030 to 1235. Patient also attended a Q and A session with Medical Director   Dr. Rush Farmer is Medical Director for Pulmonary Rehab at [facility].

## 2016-08-20 ENCOUNTER — Encounter (HOSPITAL_COMMUNITY): Payer: Non-veteran care

## 2016-08-22 ENCOUNTER — Encounter (HOSPITAL_COMMUNITY)
Admission: RE | Admit: 2016-08-22 | Discharge: 2016-08-22 | Disposition: A | Discharge status: Home / Self Care | Payer: Non-veteran care | Source: Ambulatory Visit | Attending: Pulmonary Disease | Admitting: Pulmonary Disease

## 2016-08-22 VITALS — Wt 214.9 lb

## 2016-08-22 DIAGNOSIS — J439 Emphysema, unspecified: Secondary | ICD-10-CM | POA: Diagnosis present

## 2016-08-22 NOTE — Progress Notes (Signed)
Daily Session Note  Patient Details  Name: Arthur Harper MRN: [id number] Date of Birth: 1946/01/25 Referring Provider:     Pulmonary Rehab Walk Test from 04/30/2016 in [place]  Referring Provider  Dr. Gwenette Greet      Encounter Date: 08/22/2016  Check In:     Session Check In - 08/22/16 1121      Check-In   Location [facility]   Staff Present Rodney Langton, RN;Rhodes Calvert Leonia Reeves, RN, Luisa Hart, RN, BSN   Supervising physician immediately available to respond to emergencies [facility] immediately available   Physician(s) Dr. Sloan Leiter   Medication changes reported     No   Fall or balance concerns reported    No   Tobacco Cessation No Change   Warm-up and Cool-down Performed as group-led instruction   Resistance Training Performed Yes   VAD Patient? No     Pain Assessment   Currently in Pain? No/denies   Multiple Pain Sites No      Capillary Blood Glucose: No results found for this or any previous visit (from the past 24 hour(s)).      Exercise Prescription Changes - 08/22/16 1200      Response to Exercise   Blood Pressure (Admit) 110/60   Blood Pressure (Exercise) 142/72   Blood Pressure (Exit) 120/60   Heart Rate (Admit) 70 bpm   Heart Rate (Exercise) 100 bpm   Heart Rate (Exit) 78 bpm   Oxygen Saturation (Admit) 97 %   Oxygen Saturation (Exercise) 89 %   Oxygen Saturation (Exit) 97 %   Rating of Perceived Exertion (Exercise) 12   Perceived Dyspnea (Exercise) 1.5   Duration Continue with 45 min of aerobic exercise without signs/symptoms of physical distress.   Intensity THRR unchanged     Progression   Progression Continue to progress workloads to maintain intensity without signs/symptoms of physical distress.     Resistance Training   Training Prescription Yes   Weight blue bands   Reps 10-15   Time 10 Minutes     Interval Training   Interval Training No     NuStep   Level 7   Minutes 17   METs 3.5     Track   Laps 24   Minutes 17      History  Smoking Status  . Former Smoker  . Packs/day: 1.00  . Years: 40.00  . Types: Cigarettes  . Quit date: 01/01/2012  Smokeless Tobacco  . Never Used    Comment: 10 cigs per day    Goals Met:  Exercise tolerated well Strength training completed today  Goals Unmet:  Not Applicable  Comments: Service time is from 1030 to 1220    Dr. Rush Farmer is Medical Director for Pulmonary Rehab at [facility].

## 2016-08-27 ENCOUNTER — Encounter (HOSPITAL_COMMUNITY)
Admission: RE | Admit: 2016-08-27 | Discharge: 2016-08-27 | Disposition: A | Discharge status: Home / Self Care | Payer: Non-veteran care | Source: Ambulatory Visit | Attending: Pulmonary Disease | Admitting: Pulmonary Disease

## 2016-08-27 VITALS — Wt 217.6 lb

## 2016-08-27 DIAGNOSIS — J439 Emphysema, unspecified: Secondary | ICD-10-CM

## 2016-08-27 NOTE — Progress Notes (Deleted)
Cardiac Individual Treatment Plan  Patient Details  Name: Arthur Harper MRN: [id number] Date of Birth: [birth year] Referring Provider:     Pulmonary Rehab Walk Test from 04/30/2016 in [place]  Referring Provider  Dr. Gwenette Greet      Initial Encounter Date:    Pulmonary Rehab Walk Test from 04/30/2016 in [place]  Date  04/30/16  Referring Provider  Dr. Gwenette Greet      Visit Diagnosis: Pulmonary emphysema, unspecified emphysema type ([place])  Patient's Home Medications on Admission:  Current Outpatient Prescriptions:  .  albuterol (PROVENTIL HFA;VENTOLIN HFA) 108 (90 BASE) MCG/ACT inhaler, Inhale 2 puffs into the lungs every 6 (six) hours as needed., Disp: , Rfl:  .  aspirin 81 MG chewable tablet, Chew 81 mg by mouth daily., Disp: , Rfl:  .  budesonide-formoterol (SYMBICORT) 160-4.5 MCG/ACT inhaler, Inhale 2 puffs into the lungs 2 (two) times daily., Disp: , Rfl:  .  Multiple Vitamin (MULTIVITAMIN) capsule, Take 1 capsule by mouth daily., Disp: , Rfl:  .  terazosin (HYTRIN) 5 MG capsule, Take 5 mg by mouth at bedtime., Disp: , Rfl:  .  tiotropium (SPIRIVA) 18 MCG inhalation capsule, Place 18 mcg into inhaler and inhale daily., Disp: , Rfl:   Past Medical History: Past Medical History:  Diagnosis Date  . Agent orange exposure   . COPD (chronic obstructive pulmonary disease) ([place])   . PTSD (post-traumatic stress disorder)   . Sleep apnea     Tobacco Use: History  Smoking Status  . Former Smoker  . Packs/day: 1.00  . Years: 40.00  . Types: Cigarettes  . Quit date: 01/01/2012  Smokeless Tobacco  . Never Used    Comment: 10 cigs per day    Labs: Recent Review Flowsheet Data    Labs for ITP Cardiac and Pulmonary Rehab Latest Ref Rng & Units 12/25/2007 08/14/2012   Cholestrol 0 - 200 mg/dL 171 165   LDLCALC 0 - 99 mg/dL 105(H) 98   HDL >39.00 mg/dL 55.4 54.10   Trlycerides 0.0 - 149.0 mg/dL 52 65.0       Capillary Blood Glucose: No results found for: GLUCAP   Exercise Target Goals:    Exercise Program Goal: Individual exercise prescription set with THRR, safety & activity barriers. Participant demonstrates ability to understand and report RPE using BORG scale, to self-measure pulse accurately, and to acknowledge the importance of the exercise prescription.  Exercise Prescription Goal: Starting with aerobic activity 30 plus minutes a day, 3 days per week for initial exercise prescription. Provide home exercise prescription and guidelines that participant acknowledges understanding prior to discharge.  Activity Barriers & Risk Stratification:     Activity Barriers & Cardiac Risk Stratification - 04/26/16 1313      Activity Barriers & Cardiac Risk Stratification   Activity Barriers Deconditioning;Shortness of Breath      6 Minute Walk:     6 Minute Walk    Row Name 04/30/16 1544         6 Minute Walk   Phase Initial     Distance 1515 feet     Walk Time 6 minutes     # of Rest Breaks 0     MPH 2.86     METS 3.14     RPE 12     Perceived Dyspnea  1     Symptoms No     Resting HR 79 bpm     Resting BP 152/84  Max Ex. HR 94 bpm     Max Ex. BP 152/74       Interval HR   Baseline HR 79     1 Minute HR 86     2 Minute HR 97     3 Minute HR 91     4 Minute HR 94     5 Minute HR 93     6 Minute HR 93     2 Minute Post HR 78     Interval Heart Rate? Yes       Interval Oxygen   Interval Oxygen? Yes     Baseline Oxygen Saturation % 97 %     Baseline Liters of Oxygen 0 L     1 Minute Oxygen Saturation % 95 %     1 Minute Liters of Oxygen 0 L     2 Minute Oxygen Saturation % 94 %     2 Minute Liters of Oxygen 0 L     3 Minute Oxygen Saturation % 94 %     3 Minute Liters of Oxygen 0 L     4 Minute Oxygen Saturation % 94 %     4 Minute Liters of Oxygen 0 L     5 Minute Oxygen Saturation % 95 %     5 Minute Liters of Oxygen 0 L     6 Minute Oxygen  Saturation % 95 %     6 Minute Liters of Oxygen 0 L     2 Minute Post Oxygen Saturation % 97 %     2 Minute Post Liters of Oxygen 0 L        Oxygen Initial Assessment:     Oxygen Initial Assessment - 07/01/16 1101      Home Oxygen   Home Oxygen Device None     Initial 6 min Walk   Oxygen Used None     Program Oxygen Prescription   Program Oxygen Prescription None      Oxygen Re-Evaluation:     Oxygen Re-Evaluation    Row Name 07/30/16 0713 08/27/16 0819           Program Oxygen Prescription   Program Oxygen Prescription None None        Home Oxygen   Home Oxygen Device None None         Oxygen Discharge (Final Oxygen Re-Evaluation):     Oxygen Re-Evaluation - 08/27/16 0819      Program Oxygen Prescription   Program Oxygen Prescription None     Home Oxygen   Home Oxygen Device None      Initial Exercise Prescription:     Initial Exercise Prescription - 04/30/16 1500      Date of Initial Exercise RX and Referring Provider   Date 04/30/16   Referring Provider Dr. Gwenette Greet     Bike   Level 0.7   Minutes 17     NuStep   Level 2   Minutes 17   METs 1.5     Track   Laps 10   Minutes 17     Prescription Details   Frequency (times per week) 2   Duration Progress to 45 minutes of aerobic exercise without signs/symptoms of physical distress     Intensity   THRR 40-80% of Max Heartrate 60-120   Ratings of Perceived Exertion 11-13   Perceived Dyspnea 0-4     Progression   Progression Continue progressive overload as per policy without signs/symptoms or  physical distress.     Resistance Training   Training Prescription Yes   Weight blue bands   Reps 10-12      Perform Capillary Blood Glucose checks as needed.  Exercise Prescription Changes:     Exercise Prescription Changes    Row Name 05/07/16 1200 05/14/16 1200 05/16/16 1223 05/21/16 1200 06/04/16 1300     Response to Exercise   Blood Pressure (Admit) 108/68 122/64 120/70 120/74  140/70   Blood Pressure (Exercise) 144/80 130/76 124/70 134/82 110/64   Blood Pressure (Exit) 100/60 100/62 118/68 108/68  -   Heart Rate (Admit) 66 bpm 65 bpm 76 bpm 62 bpm 65 bpm   Heart Rate (Exercise) 81 bpm 85 bpm 105 bpm 88 bpm 104 bpm   Heart Rate (Exit) 72 bpm 75 bpm 70 bpm 64 bpm  -   Oxygen Saturation (Admit) 98 % 95 % 95 % 96 % 97 %   Oxygen Saturation (Exercise) 96 % 94 % 98 % 96 % 96 %   Oxygen Saturation (Exit) 96 % 95 % 97 % 96 %  -   Rating of Perceived Exertion (Exercise) '12 12 12 12 12   '$ Perceived Dyspnea (Exercise) '1 1 3 1 2   '$ Duration Progress to 45 minutes of aerobic exercise without signs/symptoms of physical distress Progress to 45 minutes of aerobic exercise without signs/symptoms of physical distress Progress to 45 minutes of aerobic exercise without signs/symptoms of physical distress Progress to 45 minutes of aerobic exercise without signs/symptoms of physical distress Progress to 45 minutes of aerobic exercise without signs/symptoms of physical distress   Intensity -  40-80% HRR -  40-80% HRR -  40-80% HRR THRR unchanged THRR unchanged     Progression   Progression  - Continue progressive overload as per policy without signs/symptoms or physical distress. Continue progressive overload as per policy without signs/symptoms or physical distress. Continue to progress workloads to maintain intensity without signs/symptoms of physical distress. Continue to progress workloads to maintain intensity without signs/symptoms of physical distress.     Resistance Training   Training Prescription Yes Yes Yes Yes Yes   Weight blue bands blue bands blue bands blue bands  blue bands blue bands  blue bands   Reps 10-12  10 minutes of strength training 10-12  10 minutes of strength training 10-12  10 minutes of strength training 10-12  10 minutes of strength training 10-12  10 minutes of strength training     Interval Training   Interval Training No No No No No     Bike    Level 0.7 0.7 1 1.1 1   Minutes '17 17 17 17 17     '$ NuStep   Level 2 4  - 6 6   Minutes 17 17  - 17 17   METs 1.8 2.1  - 3 2.7     Track   Laps '18 20 23 22 18   '$ Minutes '17 17 17 17 17     '$ Exercise Review   Progression  - Yes Yes Yes  -   Row Name 06/06/16 1200 06/11/16 1200 06/18/16 1215 06/20/16 1241 06/27/16 1200     Response to Exercise   Blood Pressure (Admit) 104/66 120/70 96/60 104/70 124/60   Blood Pressure (Exercise) 130/74 120/70 130/60 130/80 136/74   Blood Pressure (Exit) 106/66 104/62 108/82 108/66 114/78   Heart Rate (Admit) 73 bpm 66 bpm 68 bpm 65 bpm 69 bpm   Heart Rate (Exercise) 91 bpm  99 bpm 88 bpm 88 bpm 99 bpm   Heart Rate (Exit) 70 bpm 67 bpm 76 bpm 70 bpm 81 bpm   Oxygen Saturation (Admit) 96 % 98 % 97 % 97 % 97 %   Oxygen Saturation (Exercise) 95 % 96 % 97 % 96 % 97 %   Oxygen Saturation (Exit) 96 % 97 % 97 % 96 % 96 %   Rating of Perceived Exertion (Exercise) '12 11 12 12 13   '$ Perceived Dyspnea (Exercise) '2 1 1 1 1   '$ Duration Progress to 45 minutes of aerobic exercise without signs/symptoms of physical distress Progress to 45 minutes of aerobic exercise without signs/symptoms of physical distress Continue with 45 min of aerobic exercise without signs/symptoms of physical distress. Continue with 45 min of aerobic exercise without signs/symptoms of physical distress. Continue with 45 min of aerobic exercise without signs/symptoms of physical distress.   Intensity THRR unchanged THRR unchanged THRR unchanged THRR unchanged THRR unchanged     Progression   Progression Continue to progress workloads to maintain intensity without signs/symptoms of physical distress. Continue to progress workloads to maintain intensity without signs/symptoms of physical distress. Continue to progress workloads to maintain intensity without signs/symptoms of physical distress. Continue to progress workloads to maintain intensity without signs/symptoms of physical distress. Continue to  progress workloads to maintain intensity without signs/symptoms of physical distress.     Resistance Training   Training Prescription Yes Yes Yes Yes Yes   Weight blue bands  blue bands blue bands  10 minutes of strength training blue bands blue bands blue bands   Reps 10-12  10 minutes of strength training 10-12 10-15 10-15 10-15   Time  -  - 10 Minutes 10 Minutes 10 Minutes     Interval Training   Interval Training No No No No No     Bike   Level  - 1 1 1.3 1.1   Minutes  - '17 17 17 17     '$ NuStep   Level '6 6 6 7  '$ -   Minutes '17 17 17 17  '$ -   METs 2.7 3.1 3.3  -  -     Track   Laps '21 18 22  '$ - 21   Minutes '17 17 17  '$ - 17     Exercise Review   Progression Yes  -  - Yes  -   Row Name 07/02/16 1246 07/04/16 1200 07/09/16 1220 07/11/16 1200 07/16/16 1200     Response to Exercise   Blood Pressure (Admit) 114/62 94/50 124/74 120/72 114/70   Blood Pressure (Exercise) 142/80 150/70 150/80 142/78 122/74   Blood Pressure (Exit) 118/72 128/80 114/72 92/66 114/70   Heart Rate (Admit) 66 bpm 62 bpm 68 bpm 68 bpm 72 bpm   Heart Rate (Exercise) 87 bpm 84 bpm 105 bpm 98 bpm 96 bpm   Heart Rate (Exit) 74 bpm 76 bpm  - 75 bpm 71 bpm   Oxygen Saturation (Admit) 99 % 97 % 98 % 98 % 96 %   Oxygen Saturation (Exercise) 97 % 98 % 96 % 95 % 98 %   Oxygen Saturation (Exit) 95 % 98 %  - 96 % 99 %   Rating of Perceived Exertion (Exercise) '12 12 12 12 11   '$ Perceived Dyspnea (Exercise) '2 1 2 1 1   '$ Duration Continue with 45 min of aerobic exercise without signs/symptoms of physical distress. Continue with 45 min of aerobic exercise without signs/symptoms of physical distress.  Continue with 45 min of aerobic exercise without signs/symptoms of physical distress. Continue with 45 min of aerobic exercise without signs/symptoms of physical distress. Continue with 45 min of aerobic exercise without signs/symptoms of physical distress.   Intensity THRR unchanged THRR unchanged THRR unchanged THRR unchanged  THRR unchanged     Progression   Progression Continue to progress workloads to maintain intensity without signs/symptoms of physical distress. Continue to progress workloads to maintain intensity without signs/symptoms of physical distress. Continue to progress workloads to maintain intensity without signs/symptoms of physical distress. Continue to progress workloads to maintain intensity without signs/symptoms of physical distress. Continue to progress workloads to maintain intensity without signs/symptoms of physical distress.     Resistance Training   Training Prescription Yes Yes Yes Yes Yes   Weight blue bands blue bands blue bands blue bands blue bands   Reps 10-15 10-15 10-15 10-15 10-15   Time 10 Minutes 10 Minutes 10 Minutes 10 Minutes 10 Minutes     Interval Training   Interval Training No No No No No     Bike   Level 1.3  - 1.3 1.3 1.3   Minutes 17  - '17 17 17     '$ NuStep   Level '7 7 7  '$ - 7   Minutes '17 17 17  '$ - 17   METs 3.4 3.3 4.2  -  -     Track   Laps '19 25 22 21 21   '$ Minutes '17 17 17 17 17   '$ Row Name 07/18/16 1300 07/23/16 1200 07/25/16 1300 07/30/16 1200 08/01/16 1200     Response to Exercise   Blood Pressure (Admit) 110/64 100/72 112/70 126/70 110/62   Blood Pressure (Exercise) 130/70 118/82 124/60 142/62 138/60   Blood Pressure (Exit) 100/50 94/60 110/60 118/60 110/60   Heart Rate (Admit) 64 bpm 59 bpm 61 bpm 68 bpm 61 bpm   Heart Rate (Exercise) 85 bpm 88 bpm 110 bpm 102 bpm 112 bpm   Heart Rate (Exit) 68 bpm 72 bpm 74 bpm 73 bpm 70 bpm   Oxygen Saturation (Admit) 97 % 99 % 98 % 97 % 97 %   Oxygen Saturation (Exercise) 96 % 96 % 98 % 96 % 97 %   Oxygen Saturation (Exit) 96 % 97 % 95 % 98 % 97 %   Rating of Perceived Exertion (Exercise) '12 12 12 13 12   '$ Perceived Dyspnea (Exercise) '1 1 2 2 1   '$ Duration Continue with 45 min of aerobic exercise without signs/symptoms of physical distress. Continue with 45 min of aerobic exercise without signs/symptoms of  physical distress. Continue with 45 min of aerobic exercise without signs/symptoms of physical distress. Continue with 45 min of aerobic exercise without signs/symptoms of physical distress. Continue with 45 min of aerobic exercise without signs/symptoms of physical distress.   Intensity THRR unchanged THRR unchanged THRR unchanged THRR unchanged THRR unchanged     Progression   Progression Continue to progress workloads to maintain intensity without signs/symptoms of physical distress. Continue to progress workloads to maintain intensity without signs/symptoms of physical distress. Continue to progress workloads to maintain intensity without signs/symptoms of physical distress. Continue to progress workloads to maintain intensity without signs/symptoms of physical distress. Continue to progress workloads to maintain intensity without signs/symptoms of physical distress.     Resistance Training   Training Prescription Yes Yes Yes Yes Yes   Weight blue bands blue bands blue bands blue bands blue bands   Reps 10-15 10-15  10-15 10-15 10-15   Time 10 Minutes 10 Minutes 10 Minutes 10 Minutes 10 Minutes     Interval Training   Interval Training No No No No No     Bike   Level 1.3 1.3 1.7 1.7 1.7   Minutes '17 17 17 17 17     '$ NuStep   Level 7 7  - 7 7   Minutes 17 17  - 17 17   METs 4.2 3.6  -  - 3.5     Track   Laps  - '20 26 28  '$ -   Minutes  - '17 17 17  '$ -     Home Exercise Plan   Plans to continue exercise at  - Home (comment)  -  -  -   Frequency  - Add 3 additional days to program exercise sessions.  -  -  -     Exercise Review   Progression  -  - Yes  -  -   Row Name 08/06/16 1200 08/08/16 1249 08/13/16 1224 08/15/16 1244 08/22/16 1200     Response to Exercise   Blood Pressure (Admit) 112/68 100/54 100/54 126/58 110/60   Blood Pressure (Exercise) 148/80 126/60 130/64 150/76 142/72   Blood Pressure (Exit) 104/60 120/60 110/64 130/80 120/60   Heart Rate (Admit) 62 bpm 62 bpm 76 bpm  65 bpm 70 bpm   Heart Rate (Exercise) 110 bpm 90 bpm 75 bpm 95 bpm 100 bpm   Heart Rate (Exit) 71 bpm 71 bpm 64 bpm 88 bpm 78 bpm   Oxygen Saturation (Admit) 98 % 97 % 96 % 97 % 97 %   Oxygen Saturation (Exercise) 95 % 96 % 96 % 96 % 89 %   Oxygen Saturation (Exit) 96 % 95 % 97 % 95 % 97 %   Rating of Perceived Exertion (Exercise) '13 12 12 12 12   '$ Perceived Dyspnea (Exercise) 2 2 1.5 1.5 1.5   Duration Continue with 45 min of aerobic exercise without signs/symptoms of physical distress. Continue with 45 min of aerobic exercise without signs/symptoms of physical distress. Continue with 45 min of aerobic exercise without signs/symptoms of physical distress. Continue with 45 min of aerobic exercise without signs/symptoms of physical distress. Continue with 45 min of aerobic exercise without signs/symptoms of physical distress.   Intensity THRR unchanged THRR unchanged THRR unchanged THRR unchanged THRR unchanged     Progression   Progression Continue to progress workloads to maintain intensity without signs/symptoms of physical distress. Continue to progress workloads to maintain intensity without signs/symptoms of physical distress. Continue to progress workloads to maintain intensity without signs/symptoms of physical distress. Continue to progress workloads to maintain intensity without signs/symptoms of physical distress. Continue to progress workloads to maintain intensity without signs/symptoms of physical distress.     Resistance Training   Training Prescription Yes Yes Yes Yes Yes   Weight blue bands blue bands blue bands blue bands blue bands   Reps 10-15 10-15 10-15 10-15 10-15   Time 10 Minutes 10 Minutes 10 Minutes 10 Minutes 10 Minutes     Interval Training   Interval Training No No No No No     Bike   Level 1.7  - 1.7 1.7  -   Minutes 17  - 17 17  -     NuStep   Level '7 7 7  '$ - 7   Minutes '17 17 17  '$ - 17   METs 3.7 3.5 2.8  - 3.5  Track   Laps 32 '26 22 23 24   '$ Minutes  '17 17 17 17 17      '$ Exercise Comments:     Exercise Comments    Row Name 05/13/16 1644 06/03/16 1309 07/23/16 1306       Exercise Comments Patient has only attended one exercise session. Will cont. to monitor and progress.  Patient has attended 4 complete exercise sessions. Walking up to 22 laps in 15 minutes. Will cont. to monitor and progress.  Home Exercise Prescription completed        Exercise Goals and Review:   Exercise Goals Re-Evaluation :     Exercise Goals Re-Evaluation    Row Name 07/02/16 0807 07/29/16 1618 08/26/16 0721         Exercise Goal Re-Evaluation   Exercise Goals Review Increase Physical Activity;Increase Strenth and Stamina Increase Physical Activity;Increase Strenth and Stamina Increase Physical Activity;Increase Strenth and Stamina     Comments Patient is progressing well in rehab. Has absences due to family issues. Open to workload increases. Will cont. to monitor and progress.  As of lately, patient is beginning to push himself. He walked 26 laps which is equal to one mile in 15 minutes. Open to workload changes. Averages over 4 METS on stepper. Will cont. to monitor and progress.  As of lately, patient is beginning to push himself. He walked 26 laps which is equal to one mile in 15 minutes. Open to workload changes. Averages over 4 METS on stepper. Will reinforce the importance of home exercise. Will cont. to monitor and progress.      Expected Outcomes Through exercise at rehab and home patient will increase physical capacity, stength, and stamina. Through exercise at rehab and home patient will increase physical capacity, stength, and stamina. Through exercise at rehab and home patient will increase physical capacity, stength, and stamina.         Discharge Exercise Prescription (Final Exercise Prescription Changes):     Exercise Prescription Changes - 08/22/16 1200      Response to Exercise   Blood Pressure (Admit) 110/60   Blood Pressure  (Exercise) 142/72   Blood Pressure (Exit) 120/60   Heart Rate (Admit) 70 bpm   Heart Rate (Exercise) 100 bpm   Heart Rate (Exit) 78 bpm   Oxygen Saturation (Admit) 97 %   Oxygen Saturation (Exercise) 89 %   Oxygen Saturation (Exit) 97 %   Rating of Perceived Exertion (Exercise) 12   Perceived Dyspnea (Exercise) 1.5   Duration Continue with 45 min of aerobic exercise without signs/symptoms of physical distress.   Intensity THRR unchanged     Progression   Progression Continue to progress workloads to maintain intensity without signs/symptoms of physical distress.     Resistance Training   Training Prescription Yes   Weight blue bands   Reps 10-15   Time 10 Minutes     Interval Training   Interval Training No     NuStep   Level 7   Minutes 17   METs 3.5     Track   Laps 24   Minutes 17      Nutrition:  Target Goals: Understanding of nutrition guidelines, daily intake of sodium '1500mg'$ , cholesterol '200mg'$ , calories 30% from fat and 7% or less from saturated fats, daily to have 5 or more servings of fruits and vegetables.  Biometrics:     Pre Biometrics - 04/26/16 1405      Pre Biometrics   Grip Strength 48 kg  Nutrition Therapy Plan and Nutrition Goals:     Nutrition Therapy & Goals - 06/18/16 1140      Nutrition Therapy   Diet General Healthful     Personal Nutrition Goals   Nutrition Goal Wt loss of 1-2 lb/week to a wt loss goal of 6-24 lb at graduation from [place], educate and counsel regarding individualized specific dietary modifications aiming towards targeted core components such as weight, hypertension, lipid management, diabetes, heart failure and other comorbidities.  1500 kcal, 5 day menu ideas   Expected Outcomes Short Term Goal: Understand basic principles of dietary content, such as calories, fat, sodium, cholesterol and nutrients.;Long Term Goal: Adherence to prescribed nutrition  plan.      Nutrition Discharge: Nutrition Scores:     Nutrition Assessments - 06/18/16 1139      Rate Your Plate Scores   Pre Score 41      Nutrition Goals Re-Evaluation:   Nutrition Goals Re-Evaluation:   Nutrition Goals Discharge (Final Nutrition Goals Re-Evaluation):   Psychosocial: Target Goals: Acknowledge presence or absence of significant depression and/or stress, maximize coping skills, provide positive support system. Participant is able to verbalize types and ability to use techniques and skills needed for reducing stress and depression.  Initial Review & Psychosocial Screening:     Initial Psych Review & Screening - 04/26/16 [place]? No     Barriers   Psychosocial barriers to participate in program There are no identifiable barriers or psychosocial needs.     Screening Interventions   Interventions Encouraged to exercise      Quality of Life Scores:   PHQ-9: Recent Review Flowsheet Data    Depression screen [facility] 2/9 04/26/2016   Decreased Interest 1   Down, Depressed, Hopeless 0   PHQ - 2 Score 1     Interpretation of Total Score  Total Score Depression Severity:  1-4 = Minimal depression, 5-9 = Mild depression, 10-14 = Moderate depression, 15-19 = Moderately severe depression, 20-27 = Severe depression   Psychosocial Evaluation and Intervention:     Psychosocial Evaluation - 04/26/16 1323      Psychosocial Evaluation & Interventions   Interventions Encouraged to exercise with the program and follow exercise prescription      Psychosocial Re-Evaluation:     Psychosocial Re-Evaluation    [place] Name 05/14/16 0732 06/03/16 1603 07/01/16 1101 07/30/16 0714 08/27/16 2229     Psychosocial Re-Evaluation   Current issues with  -  -  - None Identified None Identified   Comments no psychosocial barriers identified since admission no psychosocial barriers identified since admission no psychosocial barriers  identified since admission no psychosocial barriers identified since admission no psychosocial barriers identified since admission   Expected Outcomes  -  -  -  - patient will remain free of psychosocial barriers over the next 30 days   Interventions Encouraged to attend Pulmonary Rehabilitation for the exercise Encouraged to attend Pulmonary Rehabilitation for the exercise Encouraged to attend Pulmonary Rehabilitation for the exercise Encouraged to attend Pulmonary Rehabilitation for the exercise Encouraged to attend Pulmonary Rehabilitation for the exercise   Continue Psychosocial Services   -  - No Follow up required No Follow up required No Follow up required      Psychosocial Discharge (Final Psychosocial Re-Evaluation):     Psychosocial Re-Evaluation - 08/27/16 7989      Psychosocial Re-Evaluation  Current issues with None Identified   Comments no psychosocial barriers identified since admission   Expected Outcomes patient will remain free of psychosocial barriers over the next 30 days   Interventions Encouraged to attend Pulmonary Rehabilitation for the exercise   Continue Psychosocial Services  No Follow up required      Vocational Rehabilitation: Provide vocational rehab assistance to qualifying candidates.   Vocational Rehab Evaluation & Intervention:   Education: Education Goals: Education classes will be provided on a weekly basis, covering required topics. Participant will state understanding/return demonstration of topics presented.  Learning Barriers/Preferences:     Learning Barriers/Preferences - 04/26/16 1314      Learning Barriers/Preferences   Learning Barriers None   Learning Preferences Computer/Internet;Group Instruction;Individual Instruction;Skilled Demonstration;Written Material      Education Topics: Count Your Pulse:  -Group instruction provided by verbal instruction, demonstration, patient participation and written materials to support subject.   Instructors address importance of being able to find your pulse and how to count your pulse when at home without a heart monitor.  Patients get hands on experience counting their pulse with staff help and individually.   Heart Attack, Angina, and Risk Factor Modification:  -Group instruction provided by verbal instruction, video, and written materials to support subject.  Instructors address signs and symptoms of angina and heart attacks.    Also discuss risk factors for heart disease and how to make changes to improve heart health risk factors.   Functional Fitness:  -Group instruction provided by verbal instruction, demonstration, patient participation, and written materials to support subject.  Instructors address safety measures for doing things around the house.  Discuss how to get up and down off the floor, how to pick things up properly, how to safely get out of a chair without assistance, and balance training.   Meditation and Mindfulness:  -Group instruction provided by verbal instruction, patient participation, and written materials to support subject.  Instructor addresses importance of mindfulness and meditation practice to help reduce stress and improve awareness.  Instructor also leads participants through a meditation exercise.    PULMONARY REHAB OTHER RESPIRATORY from 08/22/2016 in [facility] CARDIAC REHAB  Date  06/20/16  Educator  Theda Belfast  Instruction Review Code  2- meets goals/outcomes      Stretching for Flexibility and Mobility:  -Group instruction provided by verbal instruction, patient participation, and written materials to support subject.  Instructors lead participants through series of stretches that are designed to increase flexibility thus improving mobility.  These stretches are additional exercise for major muscle groups that are typically performed during regular warm up and cool down.   Hands Only CPR Anytime:  -Group instruction  provided by verbal instruction, video, patient participation and written materials to support subject.  Instructors co-teach with [facility]HA video for hands only CPR.  Participants get hands on experience with mannequins.   Nutrition I class: Heart Healthy Eating:  -Group instruction provided by PowerPoint slides, verbal discussion, and written materials to support subject matter. The instructor gives an explanation and review of the Therapeutic Lifestyle Changes diet recommendations, which includes a discussion on lipid goals, dietary fat, sodium, fiber, plant stanol/sterol esters, sugar, and the components of a well-balanced, healthy diet.   Nutrition II class: Lifestyle Skills:  -Group instruction provided by PowerPoint slides, verbal discussion, and written materials to support subject matter. The instructor gives an explanation and review of label reading, grocery shopping for heart health, heart healthy recipe modifications, and ways to make  healthier choices when eating out.   Diabetes Question & Answer:  -Group instruction provided by PowerPoint slides, verbal discussion, and written materials to support subject matter. The instructor gives an explanation and review of diabetes co-morbidities, pre- and post-prandial blood glucose goals, pre-exercise blood glucose goals, signs, symptoms, and treatment of hypoglycemia and hyperglycemia, and foot care basics.   Diabetes Blitz:  -Group instruction provided by PowerPoint slides, verbal discussion, and written materials to support subject matter. The instructor gives an explanation and review of the physiology behind type 1 and type 2 diabetes, diabetes medications and rational behind using different medications, pre- and post-prandial blood glucose recommendations and Hemoglobin A1c goals, diabetes diet, and exercise including blood glucose guidelines for exercising safely.    Portion Distortion:  -Group instruction provided by PowerPoint slides,  verbal discussion, written materials, and food models to support subject matter. The instructor gives an explanation of serving size versus portion size, changes in portions sizes over the last 20 years, and what consists of a serving from each food group.   Stress Management:  -Group instruction provided by verbal instruction, video, and written materials to support subject matter.  Instructors review role of stress in heart disease and how to cope with stress positively.     Exercising on Your Own:  -Group instruction provided by verbal instruction, power point, and written materials to support subject.  Instructors discuss benefits of exercise, components of exercise, frequency and intensity of exercise, and end points for exercise.  Also discuss use of nitroglycerin and activating EMS.  Review options of places to exercise outside of rehab.  Review guidelines for sex with heart disease.   Cardiac Drugs I:  -Group instruction provided by verbal instruction and written materials to support subject.  Instructor reviews cardiac drug classes: antiplatelets, anticoagulants, beta blockers, and statins.  Instructor discusses reasons, side effects, and lifestyle considerations for each drug class.   Cardiac Drugs II:  -Group instruction provided by verbal instruction and written materials to support subject.  Instructor reviews cardiac drug classes: angiotensin converting enzyme inhibitors (ACE-I), angiotensin II receptor blockers (ARBs), nitrates, and calcium channel blockers.  Instructor discusses reasons, side effects, and lifestyle considerations for each drug class.   Anatomy and Physiology of the Circulatory System:  -Group instruction provided by verbal instruction, video, and written materials to support subject.  Reviews functional anatomy of heart, how it relates to various diagnoses, and what role the heart plays in the overall system.   Knowledge Questionnaire Score:   Core  Components/Risk Factors/Patient Goals at Admission:     Personal Goals and Risk Factors at Admission - 04/26/16 1321      Core Components/Risk Factors/Patient Goals on Admission    Weight Management Yes;Obesity   Intervention Obesity: Provide education and appropriate resources to help participant work on and attain dietary goals.;Weight Management/Obesity: Establish reasonable short term and long term weight goals.   Expected Outcomes Short Term: Continue to assess and modify interventions until short term weight is achieved;Understanding of distribution of calorie intake throughout the day with the consumption of 4-5 meals/snacks;Weight Loss: Understanding of general recommendations for a balanced deficit meal plan, which promotes 1-2 lb weight loss per week and includes a negative energy balance of [phone] kcal/d;Understanding recommendations for meals to include 15-35% energy as protein, 25-35% energy from fat, 35-60% energy from carbohydrates, less than '200mg'$  of dietary cholesterol, 20-35 gm of total fiber daily   Improve shortness of breath with ADL's Yes   Intervention Provide education, individualized  exercise plan and daily activity instruction to help decrease symptoms of SOB with activities of daily living.   Expected Outcomes Short Term: Achieves a reduction of symptoms when performing activities of daily living.   Develop more efficient breathing techniques such as purse lipped breathing and diaphragmatic breathing; and practicing self-pacing with activity Yes   Intervention Provide education, demonstration and support about specific breathing techniuqes utilized for more efficient breathing. Include techniques such as pursed lipped breathing, diaphragmatic breathing and self-pacing activity.   Expected Outcomes Short Term: Participant will be able to demonstrate and use breathing techniques as needed throughout daily activities.      Core Components/Risk Factors/Patient Goals Review:       Goals and Risk Factor Review    Row Name 05/14/16 0731 06/03/16 1603 07/01/16 1101 07/30/16 0714 07/30/16 0958     Core Components/Risk Factors/Patient Goals Review   Personal Goals Review Weight Management/Obesity;Improve shortness of breath with ADL's;Increase Strength and Stamina Weight Management/Obesity;Improve shortness of breath with ADL's;Increase Strength and Stamina Weight Management/Obesity;Develop more efficient breathing techniques such as purse lipped breathing and diaphragmatic breathing and practicing self-pacing with activity.;Improve shortness of breath with ADL's Weight Management/Obesity;Develop more efficient breathing techniques such as purse lipped breathing and diaphragmatic breathing and practicing self-pacing with activity.;Improve shortness of breath with ADL's  -   Review see comments section on ITP see comments section on ITP see comments section on ITP see comments section on ITP pt wt has trended down from 101.7 kg at admission to 98.9. He tributes this to more exercise and healthy eating   Expected Outcomes See admission expected outcomes See admission expected outcomes See admission expected outcomes See admission expected outcomes  -   Row Name 08/27/16 0820             Core Components/Risk Factors/Patient Goals Review   Personal Goals Review Weight Management/Obesity;Develop more efficient breathing techniques such as purse lipped breathing and diaphragmatic breathing and practicing self-pacing with activity.;Improve shortness of breath with ADL's       Review pt wt has trended down from 101.7 kg at admission to 97.5. He tributes this to more exercise and healthy eating. He is contstantly utilizing plb techneque and states how much this has helped his shortness of breath.       Expected Outcomes See admission expected outcomes          Core Components/Risk Factors/Patient Goals at Discharge (Final Review):      Goals and Risk Factor Review - 08/27/16  0820      Core Components/Risk Factors/Patient Goals Review   Personal Goals Review Weight Management/Obesity;Develop more efficient breathing techniques such as purse lipped breathing and diaphragmatic breathing and practicing self-pacing with activity.;Improve shortness of breath with ADL's   Review pt wt has trended down from 101.7 kg at admission to 97.5. He tributes this to more exercise and healthy eating. He is contstantly utilizing plb techneque and states how much this has helped his shortness of breath.   Expected Outcomes See admission expected outcomes      ITP Comments:   Comments: ITP REVIEW Pt is making expected progress toward pulmonary rehab goals after completing 35 sessions. He has been approved for 36 session by the [place]. He continues to work extremely hard during his exercise session. He is tolerating significant workload increases and states he is exercising at home. He is on track to meet all of his rehab goals prior to completion of the program. Recommend continued exercise, life style modification,  education, and utilization of breathing techniques to increase stamina and strength and decrease shortness of breath with exertion.

## 2016-08-27 NOTE — Progress Notes (Signed)
Daily Session Note  Patient Details  Name: Arthur Harper MRN: [id number] Date of Birth: [birth year] Referring Provider:     Pulmonary Rehab Walk Test from 04/30/2016 in [place]  Referring Provider  Dr. Gwenette Greet      Encounter Date: 08/27/2016  Check In:     Session Check In - 08/27/16 1232      Check-In   Location [facility]   Staff Present Su Hilt, MS, ACSM RCEP, Exercise Physiologist;Lisa Ysidro Evert, RN;Joan Leonia Reeves, RN, Luisa Hart, RN, BSN   Supervising physician immediately available to respond to emergencies [facility] immediately available   Physician(s) Dr. Sloan Leiter   Medication changes reported     No   Fall or balance concerns reported    No   Tobacco Cessation No Change   Warm-up and Cool-down Performed as group-led instruction   Resistance Training Performed Yes   VAD Patient? No     Pain Assessment   Currently in Pain? No/denies   Multiple Pain Sites No      Capillary Blood Glucose: No results found for this or any previous visit (from the past 24 hour(s)).      Exercise Prescription Changes - 08/27/16 1200      Response to Exercise   Blood Pressure (Admit) 108/74   Blood Pressure (Exercise) 136/74   Blood Pressure (Exit) 102/62   Heart Rate (Admit) 68 bpm   Heart Rate (Exercise) 107 bpm   Heart Rate (Exit) 78 bpm   Oxygen Saturation (Admit) 97 %   Oxygen Saturation (Exercise) 95 %   Oxygen Saturation (Exit) 98 %   Rating of Perceived Exertion (Exercise) 12   Perceived Dyspnea (Exercise) 1   Duration Continue with 45 min of aerobic exercise without signs/symptoms of physical distress.   Intensity THRR unchanged     Progression   Progression Continue to progress workloads to maintain intensity without signs/symptoms of physical distress.     Resistance Training   Training Prescription Yes   Weight blue bands   Reps 10-15   Time 10 Minutes     Interval Training   Interval  Training No     Bike   Level 1.7   Minutes 17     NuStep   Level 7   Minutes 17   METs 3.1     Track   Laps 24   Minutes 17      History  Smoking Status  . Former Smoker  . Packs/day: 1.00  . Years: 40.00  . Types: Cigarettes  . Quit date: 01/01/2012  Smokeless Tobacco  . Never Used    Comment: 10 cigs per day    Goals Met:  Exercise tolerated well No report of cardiac concerns or symptoms Strength training completed today  Goals Unmet:  Not Applicable  Comments: Service time is from 10:30a to 12:30p    Dr. Rush Farmer is Medical Director for Pulmonary Rehab at [facility].

## 2016-08-29 ENCOUNTER — Encounter (HOSPITAL_COMMUNITY)
Admission: RE | Admit: 2016-08-29 | Discharge: 2016-08-29 | Disposition: A | Discharge status: Home / Self Care | Payer: Non-veteran care | Source: Ambulatory Visit | Attending: Pulmonary Disease | Admitting: Pulmonary Disease

## 2016-08-29 VITALS — Wt 217.2 lb

## 2016-08-29 DIAGNOSIS — J439 Emphysema, unspecified: Secondary | ICD-10-CM

## 2016-08-29 NOTE — Progress Notes (Signed)
Pulmonary Individual Treatment Plan  Patient Details  Name: Arthur Harper MRN: [id number] Date of Birth: 06-16-1945 Referring Provider:     Pulmonary Rehab Walk Test from 04/30/2016 in [facility]  Referring Provider  Dr. Gwenette Greet      Initial Encounter Date:    Pulmonary Rehab Walk Test from 04/30/2016 in [facility]  Date  04/30/16  Referring Provider  Dr. Gwenette Greet      Visit Diagnosis: Pulmonary emphysema, unspecified emphysema type ([place])  Patient's Home Medications on Admission:   Current Outpatient Prescriptions:  .  albuterol (PROVENTIL HFA;VENTOLIN HFA) 108 (90 BASE) MCG/ACT inhaler, Inhale 2 puffs into the lungs every 6 (six) hours as needed., Disp: , Rfl:  .  aspirin 81 MG chewable tablet, Chew 81 mg by mouth daily., Disp: , Rfl:  .  budesonide-formoterol (SYMBICORT) 160-4.5 MCG/ACT inhaler, Inhale 2 puffs into the lungs 2 (two) times daily., Disp: , Rfl:  .  Multiple Vitamin (MULTIVITAMIN) capsule, Take 1 capsule by mouth daily., Disp: , Rfl:  .  terazosin (HYTRIN) 5 MG capsule, Take 5 mg by mouth at bedtime., Disp: , Rfl:  .  tiotropium (SPIRIVA) 18 MCG inhalation capsule, Place 18 mcg into inhaler and inhale daily., Disp: , Rfl:   Past Medical History: Past Medical History:  Diagnosis Date  . Agent orange exposure   . COPD (chronic obstructive pulmonary disease) ([place])   . PTSD (post-traumatic stress disorder)   . Sleep apnea     Tobacco Use: History  Smoking Status  . Former Smoker  . Packs/day: 1.00  . Years: 40.00  . Types: Cigarettes  . Quit date: 01/01/2012  Smokeless Tobacco  . Never Used    Comment: 10 cigs per day    Labs: Recent Review Flowsheet Data    Labs for ITP Cardiac and Pulmonary Rehab Latest Ref Rng & Units 12/25/2007 08/14/2012   Cholestrol 0 - 200 mg/dL 171 165   LDLCALC 0 - 99 mg/dL 105(H) 98   HDL >39.00 mg/dL 55.4 54.10   Trlycerides 0.0 - 149.0 mg/dL 52 65.0       Capillary Blood Glucose: No results found for: GLUCAP   ADL UCSD:   Pulmonary Function Assessment:     Pulmonary Function Assessment - 04/26/16 1314      Breath   Bilateral Breath Sounds Decreased  bases, clear uppers   Shortness of Breath Yes  mild shortness of breath sometimes limits strenuous activity      Exercise Target Goals:    Exercise Program Goal: Individual exercise prescription set with THRR, safety & activity barriers. Participant demonstrates ability to understand and report RPE using BORG scale, to self-measure pulse accurately, and to acknowledge the importance of the exercise prescription.  Exercise Prescription Goal: Starting with aerobic activity 30 plus minutes a day, 3 days per week for initial exercise prescription. Provide home exercise prescription and guidelines that participant acknowledges understanding prior to discharge.  Activity Barriers & Risk Stratification:     Activity Barriers & Cardiac Risk Stratification - 04/26/16 1313      Activity Barriers & Cardiac Risk Stratification   Activity Barriers Deconditioning;Shortness of Breath      6 Minute Walk:     6 Minute Walk    Row Name 04/30/16 1544         6 Minute Walk   Phase Initial     Distance 1515 feet     Walk Time 6 minutes     #  of Rest Breaks 0     MPH 2.86     METS 3.14     RPE 12     Perceived Dyspnea  1     Symptoms No     Resting HR 79 bpm     Resting BP 152/84     Max Ex. HR 94 bpm     Max Ex. BP 152/74       Interval HR   Baseline HR 79     1 Minute HR 86     2 Minute HR 97     3 Minute HR 91     4 Minute HR 94     5 Minute HR 93     6 Minute HR 93     2 Minute Post HR 78     Interval Heart Rate? Yes       Interval Oxygen   Interval Oxygen? Yes     Baseline Oxygen Saturation % 97 %     Baseline Liters of Oxygen 0 L     1 Minute Oxygen Saturation % 95 %     1 Minute Liters of Oxygen 0 L     2 Minute Oxygen Saturation % 94 %     2 Minute  Liters of Oxygen 0 L     3 Minute Oxygen Saturation % 94 %     3 Minute Liters of Oxygen 0 L     4 Minute Oxygen Saturation % 94 %     4 Minute Liters of Oxygen 0 L     5 Minute Oxygen Saturation % 95 %     5 Minute Liters of Oxygen 0 L     6 Minute Oxygen Saturation % 95 %     6 Minute Liters of Oxygen 0 L     2 Minute Post Oxygen Saturation % 97 %     2 Minute Post Liters of Oxygen 0 L        Oxygen Initial Assessment:     Oxygen Initial Assessment - 07/01/16 1101      Home Oxygen   Home Oxygen Device None     Initial 6 min Walk   Oxygen Used None     Program Oxygen Prescription   Program Oxygen Prescription None      Oxygen Re-Evaluation:     Oxygen Re-Evaluation    Row Name 07/30/16 0713 08/27/16 0819           Program Oxygen Prescription   Program Oxygen Prescription None None        Home Oxygen   Home Oxygen Device None None         Oxygen Discharge (Final Oxygen Re-Evaluation):     Oxygen Re-Evaluation - 08/27/16 0819      Program Oxygen Prescription   Program Oxygen Prescription None     Home Oxygen   Home Oxygen Device None      Initial Exercise Prescription:     Initial Exercise Prescription - 04/30/16 1500      Date of Initial Exercise RX and Referring Provider   Date 04/30/16   Referring Provider Dr. Gwenette Greet     Bike   Level 0.7   Minutes 17     NuStep   Level 2   Minutes 17   METs 1.5     Track   Laps 10   Minutes 17     Prescription Details   Frequency (times per week) 2  Duration Progress to 45 minutes of aerobic exercise without signs/symptoms of physical distress     Intensity   THRR 40-80% of Max Heartrate 60-120   Ratings of Perceived Exertion 11-13   Perceived Dyspnea 0-4     Progression   Progression Continue progressive overload as per policy without signs/symptoms or physical distress.     Resistance Training   Training Prescription Yes   Weight blue bands   Reps 10-12      Perform Capillary  Blood Glucose checks as needed.  Exercise Prescription Changes:     Exercise Prescription Changes    Row Name 05/07/16 1200 05/14/16 1200 05/16/16 1223 05/21/16 1200 06/04/16 1300     Response to Exercise   Blood Pressure (Admit) 108/68 122/64 120/70 120/74 140/70   Blood Pressure (Exercise) 144/80 130/76 124/70 134/82 110/64   Blood Pressure (Exit) 100/60 100/62 118/68 108/68  -   Heart Rate (Admit) 66 bpm 65 bpm 76 bpm 62 bpm 65 bpm   Heart Rate (Exercise) 81 bpm 85 bpm 105 bpm 88 bpm 104 bpm   Heart Rate (Exit) 72 bpm 75 bpm 70 bpm 64 bpm  -   Oxygen Saturation (Admit) 98 % 95 % 95 % 96 % 97 %   Oxygen Saturation (Exercise) 96 % 94 % 98 % 96 % 96 %   Oxygen Saturation (Exit) 96 % 95 % 97 % 96 %  -   Rating of Perceived Exertion (Exercise) _0 Perceived Dyspnea (Exercise) _1 Duration Progress to 45 minutes of aerobic exercise without signs/symptoms of physical distress Progress to 45 minutes of aerobic exercise without signs/symptoms of physical distress Progress to 45 minutes of aerobic exercise without signs/symptoms of physical distress Progress to 45 minutes of aerobic exercise without signs/symptoms of physical distress Progress to 45 minutes of aerobic exercise without signs/symptoms of physical distress   Intensity -  40-80% HRR -  40-80% HRR -  40-80% HRR THRR unchanged THRR unchanged     Progression   Progression  - Continue progressive overload as per policy without signs/symptoms or physical distress. Continue progressive overload as per policy without signs/symptoms or physical distress. Continue to progress workloads to maintain intensity without signs/symptoms of physical distress. Continue to progress workloads to maintain intensity without signs/symptoms of physical distress.     Resistance Training   Training Prescription _2    Weight blue bands blue bands blue bands blue bands  blue bands blue bands  blue bands   Reps 10-12   10 minutes of strength training 10-12  10 minutes of strength training 10-12  10 minutes of strength training 10-12  10 minutes of strength training 10-12  10 minutes of strength training     Interval Training   Interval Training _3      Bike   Level 0.7 0.7 1 1.1 1   Minutes _4 NuStep   Level 2 4  - 6 6   Minutes 17 17  - 17 17   METs 1.8 2.1  - 3 2.7     Track   Laps _5 Minutes _6 Exercise Review   Progression  - Yes Yes Yes  -   Row Name 06/06/16 1200 06/11/16 1200 06/18/16 1215 06/20/16 1241 06/27/16 1200  Response to Exercise   Blood Pressure (Admit) 104/66 120/70 96/60 104/70 124/60   Blood Pressure (Exercise) 130/74 120/70 130/60 130/80 136/74   Blood Pressure (Exit) 106/66 104/62 108/82 108/66 114/78   Heart Rate (Admit) 73 bpm 66 bpm 68 bpm 65 bpm 69 bpm   Heart Rate (Exercise) 91 bpm 99 bpm 88 bpm 88 bpm 99 bpm   Heart Rate (Exit) 70 bpm 67 bpm 76 bpm 70 bpm 81 bpm   Oxygen Saturation (Admit) 96 % 98 % 97 % 97 % 97 %   Oxygen Saturation (Exercise) 95 % 96 % 97 % 96 % 97 %   Oxygen Saturation (Exit) 96 % 97 % 97 % 96 % 96 %   Rating of Perceived Exertion (Exercise) _0 Perceived Dyspnea (Exercise) _1 Duration Progress to 45 minutes of aerobic exercise without signs/symptoms of physical distress Progress to 45 minutes of aerobic exercise without signs/symptoms of physical distress Continue with 45 min of aerobic exercise without signs/symptoms of physical distress. Continue with 45 min of aerobic exercise without signs/symptoms of physical distress. Continue with 45 min of aerobic exercise without signs/symptoms of physical distress.   Intensity _2      Progression   Progression Continue to progress workloads to maintain intensity without signs/symptoms of physical distress. Continue to progress workloads to  maintain intensity without signs/symptoms of physical distress. Continue to progress workloads to maintain intensity without signs/symptoms of physical distress. Continue to progress workloads to maintain intensity without signs/symptoms of physical distress. Continue to progress workloads to maintain intensity without signs/symptoms of physical distress.     Resistance Training   Training Prescription _3    Weight blue bands  blue bands blue bands  10 minutes of strength training blue bands blue bands blue bands   Reps 10-12  10 minutes of strength training 10-12 10-15 10-15 10-15   Time  -  - 10 Minutes 10 Minutes 10 Minutes     Interval Training   Interval Training _4      Bike   Level  - 1 1 1.3 1.1   Minutes  - _5 NuStep   Level _6 -   Minutes _7 -   METs 2.7 3.1 3.3  -  -     Track   Laps _8 - 21   Minutes _9 - 17     Exercise Review   Progression Yes  -  - Yes  -   Row Name 07/02/16 1246 07/04/16 1200 07/09/16 1220 07/11/16 1200 07/16/16 1200     Response to Exercise   Blood Pressure (Admit) 114/62 94/50 124/74 120/72 114/70   Blood Pressure (Exercise) 142/80 150/70 150/80 142/78 122/74   Blood Pressure (Exit) 118/72 128/80 114/72 92/66 114/70   Heart Rate (Admit) 66 bpm 62 bpm 68 bpm 68 bpm 72 bpm   Heart Rate (Exercise) 87 bpm 84 bpm 105 bpm 98 bpm 96 bpm   Heart Rate (Exit) 74 bpm 76 bpm  - 75 bpm 71 bpm   Oxygen Saturation (Admit) 99 % 97 % 98 % 98 % 96 %   Oxygen Saturation (Exercise) 97 % 98 % 96 % 95 % 98 %   Oxygen Saturation (Exit) 95 % 98 %  -  96 % 99 %   Rating of Perceived Exertion (Exercise) _0 Perceived Dyspnea (Exercise) _1 Duration Continue with 45 min of aerobic exercise without signs/symptoms of physical distress. Continue with 45 min of aerobic exercise without signs/symptoms of physical distress. Continue with 45 min of aerobic exercise without  signs/symptoms of physical distress. Continue with 45 min of aerobic exercise without signs/symptoms of physical distress. Continue with 45 min of aerobic exercise without signs/symptoms of physical distress.   Intensity _2      Progression   Progression Continue to progress workloads to maintain intensity without signs/symptoms of physical distress. Continue to progress workloads to maintain intensity without signs/symptoms of physical distress. Continue to progress workloads to maintain intensity without signs/symptoms of physical distress. Continue to progress workloads to maintain intensity without signs/symptoms of physical distress. Continue to progress workloads to maintain intensity without signs/symptoms of physical distress.     Resistance Training   Training Prescription _3    Weight _4    Reps 10-15 10-15 10-15 10-15 10-15   Time 10 Minutes 10 Minutes 10 Minutes 10 Minutes 10 Minutes     Interval Training   Interval Training _5      Bike   Level 1.3  - 1.3 1.3 1.3   Minutes 17  - _6 NuStep   Level _7 - 7   Minutes _8 - 17   METs 3.4 3.3 4.2  -  -     Track   Laps _9 Minutes _10 Row Name 07/18/16 1300 07/23/16 1200 07/25/16 1300 07/30/16 1200 08/01/16 1200     Response to Exercise   Blood Pressure (Admit) 110/64 100/72 112/70 126/70 110/62   Blood Pressure (Exercise) 130/70 118/82 124/60 142/62 138/60   Blood Pressure (Exit) 100/50 94/60 110/60 118/60 110/60   Heart Rate (Admit) 64 bpm 59 bpm 61 bpm 68 bpm 61 bpm   Heart Rate (Exercise) 85 bpm 88 bpm 110 bpm 102 bpm 112 bpm   Heart Rate (Exit) 68 bpm 72 bpm 74 bpm 73 bpm 70 bpm   Oxygen Saturation (Admit) 97 % 99 % 98 % 97 % 97 %   Oxygen Saturation (Exercise) 96 % 96 % 98 % 96 % 97 %   Oxygen Saturation (Exit) 96 % 97 % 95  % 98 % 97 %   Rating of Perceived Exertion (Exercise) _11 Perceived Dyspnea (Exercise) _12 Duration Continue with 45 min of aerobic exercise without signs/symptoms of physical distress. Continue with 45 min of aerobic exercise without signs/symptoms of physical distress. Continue with 45 min of aerobic exercise without signs/symptoms of physical distress. Continue with 45 min of aerobic exercise without signs/symptoms of physical distress. Continue with 45 min of aerobic exercise without signs/symptoms of physical distress.   Intensity _13      Progression   Progression Continue to progress workloads to maintain intensity without signs/symptoms of physical distress. Continue to progress workloads to maintain intensity without signs/symptoms of physical distress. Continue to progress workloads to maintain intensity without signs/symptoms of physical distress. Continue to progress workloads  to maintain intensity without signs/symptoms of physical distress. Continue to progress workloads to maintain intensity without signs/symptoms of physical distress.     Resistance Training   Training Prescription _0    Weight _1    Reps 10-15 10-15 10-15 10-15 10-15   Time 10 Minutes 10 Minutes 10 Minutes 10 Minutes 10 Minutes     Interval Training   Interval Training _2      Bike   Level 1.3 1.3 1.7 1.7 1.7   Minutes _3 NuStep   Level 7 7  - 7 7   Minutes 17 17  - 17 17   METs 4.2 3.6  -  - 3.5     Track   Laps  - _4 -   Minutes  - _5 -     Home Exercise Plan   Plans to continue exercise at  - Home (comment)  -  -  -   Frequency  - Add 3 additional days to program exercise sessions.  -  -  -     Exercise Review   Progression  -  - Yes  -  -   Row Name 08/06/16 1200 08/08/16 1249 08/13/16 1224  08/15/16 1244 08/22/16 1200     Response to Exercise   Blood Pressure (Admit) 112/68 100/54 100/54 126/58 110/60   Blood Pressure (Exercise) 148/80 126/60 130/64 150/76 142/72   Blood Pressure (Exit) 104/60 120/60 110/64 130/80 120/60   Heart Rate (Admit) 62 bpm 62 bpm 76 bpm 65 bpm 70 bpm   Heart Rate (Exercise) 110 bpm 90 bpm 75 bpm 95 bpm 100 bpm   Heart Rate (Exit) 71 bpm 71 bpm 64 bpm 88 bpm 78 bpm   Oxygen Saturation (Admit) 98 % 97 % 96 % 97 % 97 %   Oxygen Saturation (Exercise) 95 % 96 % 96 % 96 % 89 %   Oxygen Saturation (Exit) 96 % 95 % 97 % 95 % 97 %   Rating of Perceived Exertion (Exercise) _6 Perceived Dyspnea (Exercise) 2 2 1.5 1.5 1.5   Duration Continue with 45 min of aerobic exercise without signs/symptoms of physical distress. Continue with 45 min of aerobic exercise without signs/symptoms of physical distress. Continue with 45 min of aerobic exercise without signs/symptoms of physical distress. Continue with 45 min of aerobic exercise without signs/symptoms of physical distress. Continue with 45 min of aerobic exercise without signs/symptoms of physical distress.   Intensity _7      Progression   Progression Continue to progress workloads to maintain intensity without signs/symptoms of physical distress. Continue to progress workloads to maintain intensity without signs/symptoms of physical distress. Continue to progress workloads to maintain intensity without signs/symptoms of physical distress. Continue to progress workloads to maintain intensity without signs/symptoms of physical distress. Continue to progress workloads to maintain intensity without signs/symptoms of physical distress.     Resistance Training   Training Prescription _8    Weight _9    Reps 10-15 10-15 10-15 10-15 10-15   Time 10 Minutes 10 Minutes 10  Minutes 10 Minutes 10 Minutes     Interval Training   Interval Training _10   Bike   Level 1.7  - 1.7 1.7  -   Minutes 17  - 17 17  -     NuStep   Level _0 - 7   Minutes _1 - 17   METs 3.7 3.5 2.8  - 3.5     Track   Laps 32 _2 Minutes _3 Row Name 08/27/16 1200 08/29/16 1247           Response to Exercise   Blood Pressure (Admit) 108/74 112/74      Blood Pressure (Exercise) 136/74 160/80      Blood Pressure (Exit) 102/62 110/64      Heart Rate (Admit) 68 bpm 70 bpm      Heart Rate (Exercise) 107 bpm 104 bpm      Heart Rate (Exit) 78 bpm 75 bpm      Oxygen Saturation (Admit) 97 % 98 %      Oxygen Saturation (Exercise) 95 % 94 %      Oxygen Saturation (Exit) 98 % 96 %      Rating of Perceived Exertion (Exercise) 12 12      Perceived Dyspnea (Exercise) 1 2      Duration Continue with 45 min of aerobic exercise without signs/symptoms of physical distress. Continue with 45 min of aerobic exercise without signs/symptoms of physical distress.      Intensity THRR unchanged THRR unchanged        Progression   Progression Continue to progress workloads to maintain intensity without signs/symptoms of physical distress. Continue to progress workloads to maintain intensity without signs/symptoms of physical distress.        Resistance Training   Training Prescription Yes Yes      Weight blue bands blue bands      Reps 10-15 10-15      Time 10 Minutes 10 Minutes        Interval Training   Interval Training No No        Bike   Level 1.7 1.7      Minutes 17 17        NuStep   Level 7  -      Minutes 17  -      METs 3.1  -        Track   Laps 24 21      Minutes 17 17         Exercise Comments:     Exercise Comments    Row Name 05/13/16 1644 06/03/16 1309 07/23/16 1306       Exercise Comments Patient has only attended one exercise session. Will cont. to monitor and progress.  Patient has attended 4 complete exercise  sessions. Walking up to 22 laps in 15 minutes. Will cont. to monitor and progress.  Home Exercise Prescription completed        Exercise Goals and Review:   Exercise Goals Re-Evaluation :     Exercise Goals Re-Evaluation    Row Name 07/02/16 0807 07/29/16 1618 08/26/16 0721         Exercise Goal Re-Evaluation   Exercise Goals Review Increase Physical Activity;Increase Strenth and Stamina Increase Physical Activity;Increase Strenth and Stamina Increase Physical Activity;Increase Strenth and Stamina     Comments Patient is progressing well in rehab. Has absences due to family issues. Open to workload increases. Will cont. to monitor and progress.  As of lately, patient  is beginning to push himself. He walked 26 laps which is equal to one mile in 15 minutes. Open to workload changes. Averages over 4 METS on stepper. Will cont. to monitor and progress.  As of lately, patient is beginning to push himself. He walked 26 laps which is equal to one mile in 15 minutes. Open to workload changes. Averages over 4 METS on stepper. Will reinforce the importance of home exercise. Will cont. to monitor and progress.      Expected Outcomes Through exercise at rehab and home patient will increase physical capacity, stength, and stamina. Through exercise at rehab and home patient will increase physical capacity, stength, and stamina. Through exercise at rehab and home patient will increase physical capacity, stength, and stamina.        Discharge Exercise Prescription (Final Exercise Prescription Changes):     Exercise Prescription Changes - 08/29/16 1247      Response to Exercise   Blood Pressure (Admit) 112/74   Blood Pressure (Exercise) 160/80   Blood Pressure (Exit) 110/64   Heart Rate (Admit) 70 bpm   Heart Rate (Exercise) 104 bpm   Heart Rate (Exit) 75 bpm   Oxygen Saturation (Admit) 98 %   Oxygen Saturation (Exercise) 94 %   Oxygen Saturation (Exit) 96 %   Rating of Perceived Exertion  (Exercise) 12   Perceived Dyspnea (Exercise) 2   Duration Continue with 45 min of aerobic exercise without signs/symptoms of physical distress.   Intensity THRR unchanged     Progression   Progression Continue to progress workloads to maintain intensity without signs/symptoms of physical distress.     Resistance Training   Training Prescription Yes   Weight blue bands   Reps 10-15   Time 10 Minutes     Interval Training   Interval Training No     Bike   Level 1.7   Minutes 17     Track   Laps 21   Minutes 17      Nutrition:  Target Goals: Understanding of nutrition guidelines, daily intake of sodium <1581m, cholesterol <2011m calories 30% from fat and 7% or less from saturated fats, daily to have 5 or more servings of fruits and vegetables.  Biometrics:     Pre Biometrics - 04/26/16 1405      Pre Biometrics   Grip Strength 48 kg       Nutrition Therapy Plan and Nutrition Goals:     Nutrition Therapy & Goals - 06/18/16 1140      Nutrition Therapy   Diet General Healthful     Personal Nutrition Goals   Nutrition Goal Wt loss of 1-2 lb/week to a wt loss goal of 6-24 lb [facility] regarding individualized specific dietary modifications aiming towards targeted core components such as weight, hypertension, lipid management, diabetes, heart failure and other comorbidities.  1500 kcal, 5 day menu ideas   Expected Outcomes Short Term Goal: Understand basic principles of dietary content, such as calories, fat, sodium, cholesterol and nutrients.;Long Term Goal: Adherence to prescribed nutrition plan.      Nutrition Discharge: Rate Your Plate Scores:     Nutrition Assessments - 06/18/16 1139      Rate Your Plate Scores   Pre Score 41      Nutrition Goals Re-Evaluation:   Nutrition Goals Discharge (Final Nutrition Goals Re-Evaluation):   Psychosocial: Target  Goals: Acknowledge presence or absence of  significant depression and/or stress, maximize coping skills, provide positive support system. Participant is able to verbalize types and ability to use techniques and skills needed for reducing stress and depression.  Initial Review & Psychosocial Screening:     Initial Psych Review & Screening - 04/26/16 [place]? No     Barriers   Psychosocial barriers to participate in program There are no identifiable barriers or psychosocial needs.     Screening Interventions   Interventions Encouraged to exercise      Quality of Life Scores:   PHQ-9: Recent Review Flowsheet Data    Depression screen [facility] 2/9 04/26/2016   Decreased Interest 1   Down, Depressed, Hopeless 0   PHQ - 2 Score 1     Interpretation of Total Score  Total Score Depression Severity:  1-4 = Minimal depression, 5-9 = Mild depression, 10-14 = Moderate depression, 15-19 = Moderately severe depression, 20-27 = Severe depression   Psychosocial Evaluation and Intervention:     Psychosocial Evaluation - 04/26/16 1323      Psychosocial Evaluation & Interventions   Interventions Encouraged to exercise with the program and follow exercise prescription      Psychosocial Re-Evaluation:     Psychosocial Re-Evaluation    [place] Name 05/14/16 0732 06/03/16 1603 07/01/16 1101 07/30/16 0714 08/27/16 3428     Psychosocial Re-Evaluation   Current issues with  -  -  - None Identified None Identified   Comments no psychosocial barriers identified since admission no psychosocial barriers identified since admission no psychosocial barriers identified since admission no psychosocial barriers identified since admission no psychosocial barriers identified since admission   Expected Outcomes  -  -  -  - patient will remain free of psychosocial barriers over the next 30 days   Interventions Encouraged to attend Pulmonary Rehabilitation for the exercise  Encouraged to attend Pulmonary Rehabilitation for the exercise Encouraged to attend Pulmonary Rehabilitation for the exercise Encouraged to attend Pulmonary Rehabilitation for the exercise Encouraged to attend Pulmonary Rehabilitation for the exercise   Continue Psychosocial Services   -  - No Follow up required No Follow up required No Follow up required      Psychosocial Discharge (Final Psychosocial Re-Evaluation):     Psychosocial Re-Evaluation - 08/27/16 0821      Psychosocial Re-Evaluation   Current issues with None Identified   Comments no psychosocial barriers identified since admission   Expected Outcomes patient will remain free of psychosocial barriers over the next 30 days   Interventions Encouraged to attend Pulmonary Rehabilitation for the exercise   Continue Psychosocial Services  No Follow up required      Education: Education Goals: Education classes will be provided on a weekly basis, covering required topics. Participant will state understanding/return demonstration of topics presented.  Learning Barriers/Preferences:     Learning Barriers/Preferences - 04/26/16 1314      Learning Barriers/Preferences   Learning Barriers None   Learning Preferences Computer/Internet;Group Instruction;Individual Instruction;Skilled Demonstration;Written Material      Education Topics: Risk Factor Reduction:  -Group instruction that is supported by a PowerPoint presentation. Instructor discusses the definition of a risk factor, different risk factors for pulmonary disease, and how the heart and lungs work together.     PULMONARY REHAB OTHER RESPIRATORY from 08/29/2016 in [place]  Date  07/11/16  Educator  EP  Instruction Review Code  2- meets goals/outcomes  Nutrition for Pulmonary Patient:  -Group instruction provided by PowerPoint slides, verbal discussion, and written materials to support subject matter. The instructor gives an  explanation and review of healthy diet recommendations, which includes a discussion on weight management, recommendations for fruit and vegetable consumption, as well as protein, fluid, caffeine, fiber, sodium, sugar, and alcohol. Tips for eating when patients are short of breath are discussed.   PULMONARY REHAB OTHER RESPIRATORY from 08/29/2016 in [place]  Date  07/04/16  Educator  RD  Instruction Review Code  2- meets goals/outcomes      Pursed Lip Breathing:  -Group instruction that is supported by demonstration and informational handouts. Instructor discusses the benefits of pursed lip and diaphragmatic breathing and detailed demonstration on how to preform both.     Oxygen Safety:  -Group instruction provided by PowerPoint, verbal discussion, and written material to support subject matter. There is an overview of "What is Oxygen" and "Why do we need it".  Instructor also reviews how to create a safe environment for oxygen use, the importance of using oxygen as prescribed, and the risks of noncompliance. There is a brief discussion on traveling with oxygen and resources the patient may utilize.   PULMONARY REHAB OTHER RESPIRATORY from 08/29/2016 in [place]  Date  07/25/16  Educator  Truddie Crumble  Instruction Review Code  2- meets goals/outcomes      Oxygen Equipment:  -Group instruction provided by [employer] Staff utilizing handouts, written materials, and equipment demonstrations.   Signs and Symptoms:  -Group instruction provided by written material and verbal discussion to support subject matter. Warning signs and symptoms of infection, stroke, and heart attack are reviewed and when to call the physician/911 reinforced. Tips for preventing the spread of infection discussed.   PULMONARY REHAB OTHER RESPIRATORY from 08/29/2016 in [place]  Date  08/22/16  Educator  rn  Instruction  Review Code  2- meets goals/outcomes      Advanced Directives:  -Group instruction provided by verbal instruction and written material to support subject matter. Instructor reviews Advanced Directive laws and proper instruction for filling out document.   Pulmonary Video:  -Group video education that reviews the importance of medication and oxygen compliance, exercise, good nutrition, pulmonary hygiene, and pursed lip and diaphragmatic breathing for the pulmonary patient.   PULMONARY REHAB OTHER RESPIRATORY from 08/29/2016 in [place]  Date  08/01/16  Instruction Review Code  2- meets goals/outcomes      Exercise for the Pulmonary Patient:  -Group instruction that is supported by a PowerPoint presentation. Instructor discusses benefits of exercise, core components of exercise, frequency, duration, and intensity of an exercise routine, importance of utilizing pulse oximetry during exercise, safety while exercising, and options of places to exercise outside of rehab.     PULMONARY REHAB OTHER RESPIRATORY from 08/29/2016 in [place]  Date  08/08/16  Educator  EP  Instruction Review Code  R- Review/reinforce      Pulmonary Medications:  -Verbally interactive group education provided by instructor with focus on inhaled medications and proper administration.   PULMONARY REHAB OTHER RESPIRATORY from 08/29/2016 in [place]  Date  07/18/16  Educator  Pharm D  Instruction Review Code  2- meets goals/outcomes      Anatomy and Physiology of the Respiratory System and Intimacy:  -Group instruction provided by PowerPoint, verbal discussion, and written  material to support subject matter. Instructor reviews respiratory cycle and anatomical components of the respiratory system and their functions. Instructor also reviews differences in obstructive and restrictive respiratory diseases with examples of  each. Intimacy, Sex, and Sexuality differences are reviewed with a discussion on how relationships can change when diagnosed with pulmonary disease. Common sexual concerns are reviewed.   PULMONARY REHAB OTHER RESPIRATORY from 08/29/2016 in [place]  Date  08/29/16  Educator  RN  Instruction Review Code  2- meets goals/outcomes      Knowledge Questionnaire Score:   Core Components/Risk Factors/Patient Goals at Admission:     Personal Goals and Risk Factors at Admission - 04/26/16 1321      Core Components/Risk Factors/Patient Goals on Admission    Weight Management Yes;Obesity   Intervention Obesity: Provide education and appropriate resources to help participant work on and attain dietary goals.;Weight Management/Obesity: Establish reasonable short term and long term weight goals.   Expected Outcomes Short Term: Continue to assess and modify interventions until short term weight is achieved;Understanding of distribution of calorie intake throughout the day with the consumption of 4-5 meals/snacks;Weight Loss: Understanding of general recommendations for a balanced deficit meal plan, which promotes 1-2 lb weight loss per week and includes a negative energy balance of [phone] kcal/d;Understanding recommendations for meals to include 15-35% energy as protein, 25-35% energy from fat, 35-60% energy from carbohydrates, less than 272m of dietary cholesterol, 20-35 gm of total fiber daily   Improve shortness of breath with ADL's Yes   Intervention Provide education, individualized exercise plan and daily activity instruction to help decrease symptoms of SOB with activities of daily living.   Expected Outcomes Short Term: Achieves a reduction of symptoms when performing activities of daily living.   Develop more efficient breathing techniques such as purse lipped breathing and diaphragmatic breathing; and practicing self-pacing with activity Yes   Intervention  Provide education, demonstration and support about specific breathing techniuqes utilized for more efficient breathing. Include techniques such as pursed lipped breathing, diaphragmatic breathing and self-pacing activity.   Expected Outcomes Short Term: Participant will be able to demonstrate and use breathing techniques as needed throughout daily activities.      Core Components/Risk Factors/Patient Goals Review:      Goals and Risk Factor Review    Row Name 05/14/16 0731 06/03/16 1603 07/01/16 1101 07/30/16 0714 07/30/16 0958     Core Components/Risk Factors/Patient Goals Review   Personal Goals Review Weight Management/Obesity;Improve shortness of breath with ADL's;Increase Strength and Stamina Weight Management/Obesity;Improve shortness of breath with ADL's;Increase Strength and Stamina Weight Management/Obesity;Develop more efficient breathing techniques such as purse lipped breathing and diaphragmatic breathing and practicing self-pacing with activity.;Improve shortness of breath with ADL's Weight Management/Obesity;Develop more efficient breathing techniques such as purse lipped breathing and diaphragmatic breathing and practicing self-pacing with activity.;Improve shortness of breath with ADL's  -   Review see comments section on ITP see comments section on ITP see comments section on ITP see comments section on ITP pt wt has trended down from 101.7 kg at admission to 98.9. He tributes this to more exercise and healthy eating   Expected Outcomes See admission expected outcomes See admission expected outcomes See admission expected outcomes See admission expected outcomes  -   Row Name 08/27/16 0820             Core Components/Risk Factors/Patient Goals Review   Personal Goals Review Weight Management/Obesity;Develop more efficient breathing techniques such as purse lipped breathing and  diaphragmatic breathing and practicing self-pacing with activity.;Improve shortness of breath with  ADL's       Review pt wt has trended down from 101.7 kg at admission to 97.5. He tributes this to more exercise and healthy eating. He is contstantly utilizing plb techneque and states how much this has helped his shortness of breath.       Expected Outcomes See admission expected outcomes          Core Components/Risk Factors/Patient Goals at Discharge (Final Review):      Goals and Risk Factor Review - 08/27/16 0820      Core Components/Risk Factors/Patient Goals Review   Personal Goals Review Weight Management/Obesity;Develop more efficient breathing techniques such as purse lipped breathing and diaphragmatic breathing and practicing self-pacing with activity.;Improve shortness of breath with ADL's   Review pt wt has trended down from 101.7 kg at admission to 97.5. He tributes this to more exercise and healthy eating. He is contstantly utilizing plb techneque and states how much this has helped his shortness of breath.   Expected Outcomes See admission expected outcomes      ITP Comments:   Comments: ITP REVIEW Pt is making expected progress toward pulmonary rehab goals after completing 25 sessions. He has been approved for 36 by the [place].  Recommend continued exercise, life style modification, education, and utilization of breathing techniques to increase stamina and strength and decrease shortness of breath with exertion.

## 2016-08-29 NOTE — Progress Notes (Signed)
Daily Session Note  Patient Details  Name: Arthur Harper MRN: [id number] Date of Birth: [birth year] Referring Provider:     Pulmonary Rehab Walk Test from 04/30/2016 in [place]  Referring Provider  Dr. Gwenette Greet      Encounter Date: 08/29/2016  Check In:     Session Check In - 08/29/16 1116      Check-In   Location [facility]   Staff Present Trish Fountain, RN, Maxcine Ham, RN, Roque Cash, RN   Supervising physician immediately available to respond to emergencies [facility] immediately available   Physician(s) Dr. Wendee Beavers   Medication changes reported     No   Fall or balance concerns reported    No   Tobacco Cessation No Change   Warm-up and Cool-down Performed as group-led instruction   Resistance Training Performed Yes   VAD Patient? No     Pain Assessment   Currently in Pain? No/denies   Multiple Pain Sites No      Capillary Blood Glucose: No results found for this or any previous visit (from the past 24 hour(s)).      Exercise Prescription Changes - 08/29/16 1247      Response to Exercise   Blood Pressure (Admit) 112/74   Blood Pressure (Exercise) 160/80   Blood Pressure (Exit) 110/64   Heart Rate (Admit) 70 bpm   Heart Rate (Exercise) 104 bpm   Heart Rate (Exit) 75 bpm   Oxygen Saturation (Admit) 98 %   Oxygen Saturation (Exercise) 94 %   Oxygen Saturation (Exit) 96 %   Rating of Perceived Exertion (Exercise) 12   Perceived Dyspnea (Exercise) 2   Duration Continue with 45 min of aerobic exercise without signs/symptoms of physical distress.   Intensity THRR unchanged     Progression   Progression Continue to progress workloads to maintain intensity without signs/symptoms of physical distress.     Resistance Training   Training Prescription Yes   Weight blue bands   Reps 10-15   Time 10 Minutes     Interval Training   Interval Training No     Bike   Level 1.7   Minutes 17     Track   Laps 21   Minutes 17      History  Smoking Status  . Former Smoker  . Packs/day: 1.00  . Years: 40.00  . Types: Cigarettes  . Quit date: 01/01/2012  Smokeless Tobacco  . Never Used    Comment: 10 cigs per day    Goals Met:  Independence with exercise equipment Improved SOB with ADL's Using PLB without cueing & demonstrates good technique Changing diet to healthy choices, watching portion sizes Exercise tolerated well No report of cardiac concerns or symptoms Strength training completed today  Goals Unmet:  Not Applicable  Comments: Service time is from 1030 to 1230   Dr. Rush Farmer is Medical Director for Pulmonary Rehab at [facility].

## 2016-09-03 ENCOUNTER — Encounter (HOSPITAL_COMMUNITY)
Admission: RE | Admit: 2016-09-03 | Discharge: 2016-09-03 | Disposition: A | Discharge status: Home / Self Care | Payer: Non-veteran care | Source: Ambulatory Visit | Attending: Pulmonary Disease | Admitting: Pulmonary Disease

## 2016-09-03 VITALS — Wt 218.3 lb

## 2016-09-03 DIAGNOSIS — J439 Emphysema, unspecified: Secondary | ICD-10-CM

## 2016-09-03 NOTE — Progress Notes (Signed)
Daily Session Note  Patient Details  Name: Arthur Harper MRN: [id number] Date of Birth: 1946-01-11 Referring Provider:     Pulmonary Rehab Walk Test from 04/30/2016 in [place]  Referring Provider  Dr. Gwenette Greet      Encounter Date: 09/03/2016  Check In:     Session Check In - 09/03/16 1030      Check-In   Location [facility]   Staff Present Rosebud Poles, RN, BSN;Molly diVincenzo, MS, ACSM RCEP, Exercise Physiologist;Lisa Ysidro Evert, RN;Portia Rollene Rotunda, RN, BSN   Supervising physician immediately available to respond to emergencies [facility] immediately available   Physician(s) Dr. Wendee Beavers   Medication changes reported     No   Fall or balance concerns reported    No   Tobacco Cessation No Change   Warm-up and Cool-down Performed as group-led instruction   Resistance Training Performed Yes   VAD Patient? No     Pain Assessment   Currently in Pain? No/denies   Multiple Pain Sites No      Capillary Blood Glucose: No results found for this or any previous visit (from the past 24 hour(s)).      Exercise Prescription Changes - 09/03/16 1200      Response to Exercise   Blood Pressure (Admit) 118/70   Blood Pressure (Exercise) 146/60   Blood Pressure (Exit) 100/72   Heart Rate (Admit) 70 bpm   Heart Rate (Exercise) 121 bpm   Heart Rate (Exit) 80 bpm   Oxygen Saturation (Admit) 98 %   Oxygen Saturation (Exercise) 94 %   Oxygen Saturation (Exit) 97 %   Rating of Perceived Exertion (Exercise) 11   Perceived Dyspnea (Exercise) 1   Duration Continue with 45 min of aerobic exercise without signs/symptoms of physical distress.   Intensity THRR unchanged     Progression   Progression Continue to progress workloads to maintain intensity without signs/symptoms of physical distress.     Resistance Training   Training Prescription Yes   Weight blue bands   Reps 10-15   Time 10 Minutes     Interval Training   Interval  Training No     Bike   Level 1.7   Minutes 17     NuStep   Level 7   Minutes 17   METs 3     Track   Laps 24   Minutes 17      History  Smoking Status  . Former Smoker  . Packs/day: 1.00  . Years: 40.00  . Types: Cigarettes  . Quit date: 01/01/2012  Smokeless Tobacco  . Never Used    Comment: 10 cigs per day    Goals Met:  Exercise tolerated well Strength training completed today  Goals Unmet:  Not Applicable  Comments: Service time is from 1030 to [place]    Dr. Rush Farmer is Medical Director for Pulmonary Rehab at [facility].

## 2016-09-05 ENCOUNTER — Encounter (HOSPITAL_COMMUNITY)
Admission: RE | Admit: 2016-09-05 | Discharge: 2016-09-05 | Disposition: A | Discharge status: Home / Self Care | Payer: Non-veteran care | Source: Ambulatory Visit | Attending: Pulmonary Disease | Admitting: Pulmonary Disease

## 2016-09-05 VITALS — Wt 219.1 lb

## 2016-09-05 DIAGNOSIS — J439 Emphysema, unspecified: Secondary | ICD-10-CM

## 2016-09-05 NOTE — Progress Notes (Signed)
Daily Session Note  Patient Details  Name: Arthur Harper MRN: [id number] Date of Birth: March 24, 1946 Referring Provider:     Pulmonary Rehab Walk Test from 04/30/2016 in [place]  Referring Provider  Dr. Gwenette Greet      Encounter Date: 09/05/2016  Check In:     Session Check In - 09/05/16 1058      Check-In   [facility]   Staff Present Su Hilt, MS, ACSM RCEP, Exercise Physiologist;Nola Botkins Leonia Reeves, RN, Roque Cash, RN   Supervising physician immediately available to respond to emergencies [facility] immediately available   Physician(s) Dr. Broadus John   Medication changes reported     No   Fall or balance concerns reported    No   Tobacco Cessation No Change   Warm-up and Cool-down Performed as group-led instruction   Resistance Training Performed Yes   VAD Patient? No     Pain Assessment   Currently in Pain? No/denies   Multiple Pain Sites No      Capillary Blood Glucose: No results found for this or any previous visit (from the past 24 hour(s)).      Exercise Prescription Changes - 09/05/16 1200      Response to Exercise   Blood Pressure (Admit) 110/64   Blood Pressure (Exercise) 110/60   Blood Pressure (Exit) 100/64   Heart Rate (Admit) 65 bpm   Heart Rate (Exercise) 83 bpm   Heart Rate (Exit) 68 bpm   Oxygen Saturation (Admit) 98 %   Oxygen Saturation (Exercise) 98 %   Oxygen Saturation (Exit) 98 %   Rating of Perceived Exertion (Exercise) 12   Perceived Dyspnea (Exercise) 1   Duration Continue with 45 min of aerobic exercise without signs/symptoms of physical distress.   Intensity THRR unchanged     Progression   Progression Continue to progress workloads to maintain intensity without signs/symptoms of physical distress.     Resistance Training   Training Prescription Yes   Weight blue bands   Reps 10-15   Time 10 Minutes     Interval Training   Interval Training No     Bike    Level 1.7   Minutes 17     NuStep   Level 7   Minutes 17   METs 2.6      History  Smoking Status  . Former Smoker  . Packs/day: 1.00  . Years: 40.00  . Types: Cigarettes  . Quit date: 01/01/2012  Smokeless Tobacco  . Never Used    Comment: 10 cigs per day    Goals Met:  Exercise tolerated well Strength training completed today  Goals Unmet:  Not Applicable  Comments: Service time is from1030  to 1220    Dr. Rush Farmer is Medical Director for Pulmonary Rehab at [facility].

## 2016-09-10 ENCOUNTER — Encounter (HOSPITAL_COMMUNITY)
Admission: RE | Admit: 2016-09-10 | Discharge: 2016-09-10 | Disposition: A | Discharge status: Home / Self Care | Payer: Non-veteran care | Source: Ambulatory Visit | Attending: Pulmonary Disease | Admitting: Pulmonary Disease

## 2016-09-10 VITALS — Wt 219.8 lb

## 2016-09-10 DIAGNOSIS — J439 Emphysema, unspecified: Secondary | ICD-10-CM

## 2016-09-10 NOTE — Progress Notes (Signed)
Daily Session Note  Patient Details  Name: Arthur Harper MRN: [id number] Date of Birth: Sep 25, 1945 Referring Provider:     Pulmonary Rehab Walk Test from 04/30/2016 in [place]  Referring Provider  Dr. Gwenette Greet      Encounter Date: 09/10/2016  Check In:     Session Check In - 09/10/16 1030      Check-In   [facility]   Staff Present Rosebud Poles, RN, BSN;Molly diVincenzo, MS, ACSM RCEP, Exercise Physiologist;Lisa Ysidro Evert, RN;Portia Rollene Rotunda, RN, BSN   Supervising physician immediately available to respond to emergencies [facility] immediately available   Physician(s) Dr. Broadus John   Medication changes reported     No   Fall or balance concerns reported    No   Tobacco Cessation No Change   Warm-up and Cool-down Performed as group-led instruction   Resistance Training Performed Yes   VAD Patient? No     Pain Assessment   Currently in Pain? No/denies   Multiple Pain Sites No      Capillary Blood Glucose: No results found for this or any previous visit (from the past 24 hour(s)).      Exercise Prescription Changes - 09/10/16 1200      Response to Exercise   Blood Pressure (Admit) 114/62   Blood Pressure (Exercise) 136/60   Blood Pressure (Exit) 122/76   Heart Rate (Admit) 67 bpm   Heart Rate (Exercise) 83 bpm   Heart Rate (Exit) 74 bpm   Oxygen Saturation (Admit) 99 %   Oxygen Saturation (Exercise) 97 %   Oxygen Saturation (Exit) 96 %   Rating of Perceived Exertion (Exercise) 12   Perceived Dyspnea (Exercise) 2   Duration Continue with 45 min of aerobic exercise without signs/symptoms of physical distress.   Intensity THRR unchanged     Progression   Progression Continue to progress workloads to maintain intensity without signs/symptoms of physical distress.     Resistance Training   Training Prescription Yes   Weight blue bands   Reps 10-15   Time 10 Minutes     Interval Training   Interval  Training No     Bike   Level 1.7   Minutes 17     NuStep   Level 7   Minutes 17   METs 2.9     Track   Laps 23   Minutes 17      History  Smoking Status  . Former Smoker  . Packs/day: 1.00  . Years: 40.00  . Types: Cigarettes  . Quit date: 01/01/2012  Smokeless Tobacco  . Never Used    Comment: 10 cigs per day    Goals Met:  Exercise tolerated well Strength training completed today  Goals Unmet:  Not Applicable  Comments: Service time is from 1030 to 1200    Dr. Rush Farmer is Medical Director for Pulmonary Rehab at [facility].

## 2016-09-12 ENCOUNTER — Encounter (HOSPITAL_COMMUNITY)
Admission: RE | Admit: 2016-09-12 | Discharge: 2016-09-12 | Disposition: A | Discharge status: Home / Self Care | Payer: Non-veteran care | Source: Ambulatory Visit | Attending: Pulmonary Disease | Admitting: Pulmonary Disease

## 2016-09-12 VITALS — Wt 217.2 lb

## 2016-09-12 DIAGNOSIS — J439 Emphysema, unspecified: Secondary | ICD-10-CM | POA: Diagnosis not present

## 2016-09-12 NOTE — Progress Notes (Signed)
Daily Session Note  Patient Details  Name: Arthur Harper MRN: [id number] Date of Birth: 1946-02-06 Referring Provider:     Pulmonary Rehab Walk Test from 04/30/2016 in [place]  Referring Provider  Dr. Gwenette Greet      Encounter Date: 09/12/2016  Check In:     Session Check In - 09/12/16 1030      Check-In   Location [facility]   Staff Present Rosebud Poles, RN, Luisa Hart, RN, BSN;Ramon Dredge, RN, [facility]   Supervising physician immediately available to respond to emergencies [facility] immediately available   Physician(s) Dr. Karleen Hampshire   Medication changes reported     No   Fall or balance concerns reported    No   Tobacco Cessation No Change   Warm-up and Cool-down Performed as group-led instruction   Resistance Training Performed Yes   VAD Patient? No     Pain Assessment   Currently in Pain? No/denies   Multiple Pain Sites No      Capillary Blood Glucose: No results found for this or any previous visit (from the past 24 hour(s)).      Exercise Prescription Changes - 09/12/16 1200      Response to Exercise   Blood Pressure (Admit) 104/60   Blood Pressure (Exercise) 120/74   Blood Pressure (Exit) 108/60   Heart Rate (Admit) 65 bpm   Heart Rate (Exercise) 82 bpm   Heart Rate (Exit) 66 bpm   Oxygen Saturation (Admit) 98 %   Oxygen Saturation (Exercise) 97 %   Oxygen Saturation (Exit) 98 %   Rating of Perceived Exertion (Exercise) 12   Perceived Dyspnea (Exercise) 1   Duration Continue with 45 min of aerobic exercise without signs/symptoms of physical distress.   Intensity THRR unchanged     Progression   Progression Continue to progress workloads to maintain intensity without signs/symptoms of physical distress.     Resistance Training   Training Prescription Yes   Weight blue bands   Reps 10-15   Time 10 Minutes     Interval Training   Interval Training No     NuStep   Level 7   Minutes 17   METs 2.8     Track   Laps 24   Minutes 17      History  Smoking Status  . Former Smoker  . Packs/day: 1.00  . Years: 40.00  . Types: Cigarettes  . Quit date: 01/01/2012  Smokeless Tobacco  . Never Used    Comment: 10 cigs per day    Goals Met:  Exercise tolerated well Strength training completed today  Goals Unmet:  Not Applicable  Comments: Service time is from 1030 to [place]    Dr. Rush Farmer is Medical Director for Pulmonary Rehab at [facility].

## 2016-09-17 DIAGNOSIS — J989 Respiratory disorder, unspecified: Secondary | ICD-10-CM | POA: Diagnosis not present

## 2016-09-24 ENCOUNTER — Encounter (HOSPITAL_COMMUNITY)
Admission: RE | Admit: 2016-09-24 | Discharge: 2016-09-24 | Disposition: A | Discharge status: Home / Self Care | Payer: Non-veteran care | Source: Ambulatory Visit | Attending: Pulmonary Disease | Admitting: Pulmonary Disease

## 2016-09-24 DIAGNOSIS — J439 Emphysema, unspecified: Secondary | ICD-10-CM | POA: Diagnosis not present

## 2016-09-26 ENCOUNTER — Encounter (HOSPITAL_COMMUNITY): Admission: RE | Admit: 2016-09-26 | Payer: Non-veteran care | Source: Ambulatory Visit

## 2016-10-01 ENCOUNTER — Encounter (HOSPITAL_COMMUNITY): Payer: Non-veteran care

## 2016-10-01 ENCOUNTER — Encounter (HOSPITAL_COMMUNITY): Payer: Self-pay

## 2016-10-01 DIAGNOSIS — J439 Emphysema, unspecified: Secondary | ICD-10-CM

## 2016-10-01 NOTE — Progress Notes (Signed)
Discharge Summary  Patient Details  Name: Arthur Harper MRN: [id number] Date of Birth: 11/15/1945 Referring Provider:     Pulmonary Rehab Walk Test from 04/30/2016 in [facility]  Referring Provider  Dr. Clance       Number of Visits: 31  Reason for Discharge:  Patient reached a stable level of exercise. Patient independent in their exercise.  Smoking History:  History  Smoking Status  . Former Smoker  . Packs/day: 1.00  . Years: 40.00  . Types: Cigarettes  . Quit date: 01/01/2012  Smokeless Tobacco  . Never Used    Comment: 10 cigs per day    Diagnosis:  Pulmonary emphysema, unspecified emphysema type (HCC)  ADL UCSD:     Pulmonary Assessment Scores    Row Name 09/30/16 0715 09/30/16 0717       ADL UCSD   ADL Phase Exit Exit    SOB Score total 70  -      mMRC Score   mMRC Score  - 1       Initial Exercise Prescription:   Discharge Exercise Prescription (Final Exercise Prescription Changes):     Exercise Prescription Changes - 09/12/16 1200      Response to Exercise   Blood Pressure (Admit) 104/60   Blood Pressure (Exercise) 120/74   Blood Pressure (Exit) 108/60   Heart Rate (Admit) 65 bpm   Heart Rate (Exercise) 82 bpm   Heart Rate (Exit) 66 bpm   Oxygen Saturation (Admit) 98 %   Oxygen Saturation (Exercise) 97 %   Oxygen Saturation (Exit) 98 %   Rating of Perceived Exertion (Exercise) 12   Perceived Dyspnea (Exercise) 1   Duration Continue with 45 min of aerobic exercise without signs/symptoms of physical distress.   Intensity THRR unchanged     Progression   Progression Continue to progress workloads to maintain intensity without signs/symptoms of physical distress.     Resistance Training   Training Prescription Yes   Weight blue bands   Reps 10-15   Time 10 Minutes     Interval Training   Interval Training No     NuStep   Level 7   Minutes 17   METs 2.8     Track   Laps 24   Minutes 17       Functional Capacity:     6 Minute Walk    Row Name 09/24/16 1630         6 Minute Walk   Phase Discharge     Distance 2100 feet     Walk Time 6 minutes     # of Rest Breaks 0     MPH 3.97     METS 4.06     RPE 12     Perceived Dyspnea  1     Symptoms No     Resting HR 74 bpm     Resting BP 118/60     Max Ex. HR 101 bpm     Max Ex. BP 124/60       Interval HR   Baseline HR 74     1 Minute HR 82     2 Minute HR 90     3 Minute HR 96     4 Minute HR 97     5 Minute HR 101     6 Minute HR 101     2 Minute Post HR 101     Interval Heart Rate? Yes         Interval Oxygen   Interval Oxygen? Yes     Baseline Oxygen Saturation % 97 %     Baseline Liters of Oxygen 0 L     1 Minute Oxygen Saturation % 97 %     1 Minute Liters of Oxygen 0 L     2 Minute Oxygen Saturation % 97 %     2 Minute Liters of Oxygen 0 L     3 Minute Oxygen Saturation % 97 %     3 Minute Liters of Oxygen 0 L     4 Minute Oxygen Saturation % 97 %     4 Minute Liters of Oxygen 0 L     5 Minute Oxygen Saturation % 96 %     5 Minute Liters of Oxygen 0 L     6 Minute Oxygen Saturation % 96 %     6 Minute Liters of Oxygen 0 L        Psychological, QOL, Others - Outcomes: PHQ 2/9: Depression screen [facility] 2/9 09/24/2016 04/26/2016  Decreased Interest 0 1  Down, Depressed, Hopeless 0 0  PHQ - 2 Score 0 1    Quality of Life:     Quality of Life - 09/30/16 0716      Quality of Life Scores   Health/Function Post 24.3 %   Socioeconomic Post 27 %   Psych/Spiritual Post 25.71 %   Family Post 25.5 %   GLOBAL Post 25.5 %      Personal Goals: Goals established at orientation with interventions provided to work toward goal.    Personal Goals Discharge:     Goals and Risk Factor Review    Row Name 08/27/16 0820 10/01/16 0905           Core Components/Risk Factors/Patient Goals Review   Personal Goals Review Weight Management/Obesity;Develop more efficient breathing techniques such as  purse lipped breathing and diaphragmatic breathing and practicing self-pacing with activity.;Improve shortness of breath with ADL's  -      Review pt wt has trended down from 101.7 kg at admission to 97.5. He tributes this to more exercise and healthy eating. He is contstantly utilizing plb techneque and states how much this has helped his shortness of breath. patient met all of his pulmonary rehab goals      Expected Outcomes See admission expected outcomes  -         Nutrition & Weight - Outcomes:    Nutrition:   Nutrition Discharge:     Nutrition Assessments - 09/30/16 1516      Rate Your Plate Scores   Pre Score 41   Post Score 50      Education Questionnaire Score:     Knowledge Questionnaire Score - 09/30/16 0715      Knowledge Questionnaire Score   Pre Score 8/13   Post Score 13/13      Goals reviewed with patient; copy given to patient. Patient plans to continue his home exercise by walking and is considering enrolling in the pulmonary rehab maintenance program.

## 2016-10-03 ENCOUNTER — Encounter (HOSPITAL_COMMUNITY): Payer: Non-veteran care

## 2017-11-17 DIAGNOSIS — H5712 Ocular pain, left eye: Secondary | ICD-10-CM | POA: Diagnosis not present

## 2019-09-14 DIAGNOSIS — Z7982 Long term (current) use of aspirin: Secondary | ICD-10-CM | POA: Diagnosis not present

## 2019-09-14 DIAGNOSIS — R339 Retention of urine, unspecified: Secondary | ICD-10-CM | POA: Diagnosis not present

## 2019-09-14 DIAGNOSIS — Z7951 Long term (current) use of inhaled steroids: Secondary | ICD-10-CM | POA: Diagnosis not present

## 2019-09-14 DIAGNOSIS — R030 Elevated blood-pressure reading, without diagnosis of hypertension: Secondary | ICD-10-CM | POA: Diagnosis not present

## 2019-09-14 DIAGNOSIS — Z8249 Family history of ischemic heart disease and other diseases of the circulatory system: Secondary | ICD-10-CM | POA: Diagnosis not present

## 2019-09-14 DIAGNOSIS — Z809 Family history of malignant neoplasm, unspecified: Secondary | ICD-10-CM | POA: Diagnosis not present

## 2019-09-14 DIAGNOSIS — Z6831 Body mass index (BMI) 31.0-31.9, adult: Secondary | ICD-10-CM | POA: Diagnosis not present

## 2019-09-14 DIAGNOSIS — Z801 Family history of malignant neoplasm of trachea, bronchus and lung: Secondary | ICD-10-CM | POA: Diagnosis not present

## 2019-09-14 DIAGNOSIS — J449 Chronic obstructive pulmonary disease, unspecified: Secondary | ICD-10-CM | POA: Diagnosis not present

## 2019-09-14 DIAGNOSIS — E669 Obesity, unspecified: Secondary | ICD-10-CM | POA: Diagnosis not present

## 2020-02-11 ENCOUNTER — Ambulatory Visit (HOSPITAL_COMMUNITY)
Admission: RE | Admit: 2020-02-11 | Discharge: 2020-02-11 | Disposition: A | Discharge status: Home / Self Care | Payer: Medicare Other | Source: Ambulatory Visit | Attending: Pulmonary Disease | Admitting: Pulmonary Disease

## 2020-02-11 ENCOUNTER — Other Ambulatory Visit: Payer: Self-pay | Admitting: Infectious Diseases

## 2020-02-11 ENCOUNTER — Telehealth (HOSPITAL_COMMUNITY): Payer: Self-pay | Admitting: Emergency Medicine

## 2020-02-11 DIAGNOSIS — J449 Chronic obstructive pulmonary disease, unspecified: Secondary | ICD-10-CM

## 2020-02-11 DIAGNOSIS — U071 COVID-19: Secondary | ICD-10-CM | POA: Insufficient documentation

## 2020-02-11 DIAGNOSIS — Z23 Encounter for immunization: Secondary | ICD-10-CM | POA: Diagnosis not present

## 2020-02-11 MED ORDER — EPINEPHRINE 0.3 MG/0.3ML IJ SOAJ: 0.3000 mg | Freq: Once | INTRAMUSCULAR | Status: DC | PRN

## 2020-02-11 MED ORDER — METHYLPREDNISOLONE SODIUM SUCC 125 MG IJ SOLR: 125.0000 mg | Freq: Once | INTRAMUSCULAR | Status: DC | PRN

## 2020-02-11 MED ORDER — SODIUM CHLORIDE 0.9 % IV SOLN: Freq: Once | INTRAVENOUS | Status: AC

## 2020-02-11 MED ORDER — DIPHENHYDRAMINE HCL 50 MG/ML IJ SOLN: 50.0000 mg | Freq: Once | INTRAMUSCULAR | Status: DC | PRN

## 2020-02-11 MED ORDER — FAMOTIDINE IN NACL 20-0.9 MG/50ML-% IV SOLN: 20.0000 mg | Freq: Once | INTRAVENOUS | Status: DC | PRN

## 2020-02-11 MED ORDER — SODIUM CHLORIDE 0.9 % IV SOLN: INTRAVENOUS | Status: DC | PRN

## 2020-02-11 MED ORDER — ALBUTEROL SULFATE HFA 108 (90 BASE) MCG/ACT IN AERS: 2.0000 | INHALATION_SPRAY | Freq: Once | RESPIRATORY_TRACT | Status: DC | PRN

## 2020-02-11 NOTE — Progress Notes (Signed)
  Diagnosis: COVID-19  Physician: Dr. Joya Gaskins  Procedure: Covid Infusion Clinic Med: bamlanivimab\etesevimab infusion - Provided patient with bamlanimivab\etesevimab fact sheet for patients, parents and caregivers prior to infusion.  Complications: No immediate complications noted.  Discharge: Discharged home   Tia Masker 02/11/2020

## 2020-02-11 NOTE — Discharge Instructions (Signed)

## 2020-02-11 NOTE — Progress Notes (Signed)
I connected by phone with Arthur Harper on 02/11/2020 at 2:44 PM to discuss the potential use of a new treatment for mild to moderate COVID-19 viral infection in non-hospitalized patients.  This patient is a 74 y.o. male that meets the FDA criteria for Emergency Use Authorization of COVID monoclonal antibody casirivimab/imdevimab or bamlanivimab/eteseviamb.  Has a (+) direct SARS-CoV-2 viral test result  Has mild or moderate COVID-19   Is NOT hospitalized due to COVID-19  Is within 10 days of symptom onset  Has at least one of the high risk factor(s) for progression to severe COVID-19 and/or hospitalization as defined in EUA.  Specific high risk criteria : Older age (>/= 74 yo)   I have spoken and communicated the following to the patient or parent/caregiver regarding COVID monoclonal antibody treatment:  1. FDA has authorized the emergency use for the treatment of mild to moderate COVID-19 in adults and pediatric patients with positive results of direct SARS-CoV-2 viral testing who are 4 years of age and older weighing at least 40 kg, and who are at high risk for progressing to severe COVID-19 and/or hospitalization.  2. The significant known and potential risks and benefits of COVID monoclonal antibody, and the extent to which such potential risks and benefits are unknown.  3. Information on available alternative treatments and the risks and benefits of those alternatives, including clinical trials.  4. Patients treated with COVID monoclonal antibody should continue to self-isolate and use infection control measures (e.g., wear mask, isolate, social distance, avoid sharing personal items, clean and disinfect "high touch" surfaces, and frequent handwashing) according to CDC guidelines.   5. The patient or parent/caregiver has the option to accept or refuse COVID monoclonal antibody treatment.  After reviewing this information with the patient, the patient has agreed to receive  one of the available covid 19 monoclonal antibodies and will be provided an appropriate fact sheet prior to infusion. [name], NP 02/11/2020 2:44 PM

## 2020-02-11 NOTE — Telephone Encounter (Signed)
I spoke to [name] about Monoclonal antibody therapy.   He has not been vaccinated but would like to. Discussed 90 d.   Results are at the [place] in [place].

## 2020-02-11 NOTE — Telephone Encounter (Signed)
Called pt and explained possible monoclonal antibody treatment. Sx started 10/20. Tested positive 10/22 at the [facility]. Sx include occasional shortness of breath, body aches, headaches, chills, fatigue, cough, and temperature.Qualifying risk factors COPD, former smoker (30 year smoking hx), and BMI 33.3. Pt interested in tx. Informed pt an APP will call back to possibly schedule an appointment.

## 2021-01-26 ENCOUNTER — Other Ambulatory Visit: Payer: Self-pay | Admitting: *Deleted

## 2021-01-26 DIAGNOSIS — J449 Chronic obstructive pulmonary disease, unspecified: Secondary | ICD-10-CM

## 2021-01-26 NOTE — Progress Notes (Signed)
Arthur Harper scheduled 02/08/21 with Dr. Melvyn Novas for COPD consult.  Per COPD office protocol, cbc w/ diff needed within 1 year. I called and spoke with Arthur Harper.  Arthur Harper stated he has not had any labs in a while.  Advised Arthur Harper cbc w/ diff is recommended for COPD consult.  Arthur Harper stated he would come by office next week for cbc.Arthur Harper is aware of location and understands more labs and testing may be needed at [place].  CBC w/ diff order placed.

## 2021-01-29 ENCOUNTER — Other Ambulatory Visit (INDEPENDENT_AMBULATORY_CARE_PROVIDER_SITE_OTHER): Payer: No Typology Code available for payment source

## 2021-01-29 DIAGNOSIS — J449 Chronic obstructive pulmonary disease, unspecified: Secondary | ICD-10-CM

## 2021-01-29 LAB — CBC WITH DIFFERENTIAL/PLATELET
Basophils Absolute: 0 10*3/uL (ref 0.0–0.1)
Basophils Relative: 0.7 % (ref 0.0–3.0)
Eosinophils Absolute: 0.1 10*3/uL (ref 0.0–0.7)
Eosinophils Relative: 1.8 % (ref 0.0–5.0)
HCT: 39.1 % (ref 39.0–52.0)
Hemoglobin: 13.3 g/dL (ref 13.0–17.0)
Lymphocytes Relative: 18.3 % (ref 12.0–46.0)
Lymphs Abs: 1.1 10*3/uL (ref 0.7–4.0)
MCHC: 34 g/dL (ref 30.0–36.0)
MCV: 97.8 fl (ref 78.0–100.0)
Monocytes Absolute: 0.3 10*3/uL (ref 0.1–1.0)
Monocytes Relative: 4.2 % (ref 3.0–12.0)
Neutro Abs: 4.6 10*3/uL (ref 1.4–7.7)
Neutrophils Relative %: 75 % (ref 43.0–77.0)
Platelets: 227 10*3/uL (ref 150.0–400.0)
RBC: 4 Mil/uL — ABNORMAL LOW (ref 4.22–5.81)
RDW: 12.8 % (ref 11.5–15.5)
WBC: 6.1 10*3/uL (ref 4.0–10.5)

## 2021-02-08 ENCOUNTER — Ambulatory Visit (INDEPENDENT_AMBULATORY_CARE_PROVIDER_SITE_OTHER): Payer: No Typology Code available for payment source

## 2021-02-08 ENCOUNTER — Other Ambulatory Visit: Payer: Self-pay

## 2021-02-08 ENCOUNTER — Encounter: Payer: Self-pay | Admitting: Internal Medicine

## 2021-02-08 ENCOUNTER — Telehealth: Payer: Self-pay | Admitting: Internal Medicine

## 2021-02-08 ENCOUNTER — Ambulatory Visit (INDEPENDENT_AMBULATORY_CARE_PROVIDER_SITE_OTHER): Payer: No Typology Code available for payment source | Admitting: Internal Medicine

## 2021-02-08 DIAGNOSIS — J449 Chronic obstructive pulmonary disease, unspecified: Secondary | ICD-10-CM

## 2021-02-08 DIAGNOSIS — R0602 Shortness of breath: Secondary | ICD-10-CM | POA: Diagnosis not present

## 2021-02-08 LAB — CBC WITH DIFFERENTIAL/PLATELET
Basophils Absolute: 0 10*3/uL (ref 0.0–0.1)
Basophils Relative: 0.8 % (ref 0.0–3.0)
Eosinophils Absolute: 0.2 10*3/uL (ref 0.0–0.7)
Eosinophils Relative: 3.9 % (ref 0.0–5.0)
HCT: 41.8 % (ref 39.0–52.0)
Hemoglobin: 13.9 g/dL (ref 13.0–17.0)
Lymphocytes Relative: 19.3 % (ref 12.0–46.0)
Lymphs Abs: 1.1 10*3/uL (ref 0.7–4.0)
MCHC: 33.3 g/dL (ref 30.0–36.0)
MCV: 99 fl (ref 78.0–100.0)
Monocytes Absolute: 0.5 10*3/uL (ref 0.1–1.0)
Monocytes Relative: 9.4 % (ref 3.0–12.0)
Neutro Abs: 3.7 10*3/uL (ref 1.4–7.7)
Neutrophils Relative %: 66.6 % (ref 43.0–77.0)
Platelets: 254 10*3/uL (ref 150.0–400.0)
RBC: 4.22 Mil/uL (ref 4.22–5.81)
RDW: 13.3 % (ref 11.5–15.5)
WBC: 5.5 10*3/uL (ref 4.0–10.5)

## 2021-02-08 LAB — BASIC METABOLIC PANEL
BUN: 14 mg/dL (ref 6–23)
CO2: 30 mEq/L (ref 19–32)
Calcium: 9.4 mg/dL (ref 8.4–10.5)
Chloride: 103 mEq/L (ref 96–112)
Creatinine, Ser: 0.93 mg/dL (ref 0.40–1.50)
GFR: 80.61 mL/min (ref >60.00)
Glucose, Bld: 93 mg/dL (ref 70–99)
Potassium: 4.3 mEq/L (ref 3.5–5.1)
Sodium: 139 mEq/L (ref 135–145)

## 2021-02-08 LAB — BRAIN NATRIURETIC PEPTIDE: Pro B Natriuretic peptide (BNP): 109 pg/mL — ABNORMAL HIGH (ref 0.0–100.0)

## 2021-02-08 LAB — TSH: TSH: 3.43 u[IU]/mL (ref 0.35–5.50)

## 2021-02-08 MED ORDER — BUDESONIDE-FORMOTEROL FUMARATE 160-4.5 MCG/ACT IN AERO
INHALATION_SPRAY | RESPIRATORY_TRACT | 11 refills | Status: DC
Start: 2021-02-08 — End: 2022-02-05

## 2021-02-08 MED ORDER — SPIRIVA RESPIMAT 2.5 MCG/ACT IN AERS: 2.0000 | INHALATION_SPRAY | Freq: Every day | RESPIRATORY_TRACT | 11 refills | Status: DC

## 2021-02-08 MED ORDER — BUDESONIDE-FORMOTEROL FUMARATE 160-4.5 MCG/ACT IN AERO: INHALATION_SPRAY | RESPIRATORY_TRACT | 11 refills | Status: DC

## 2021-02-08 MED ORDER — SPIRIVA RESPIMAT 2.5 MCG/ACT IN AERS: 2.0000 | INHALATION_SPRAY | Freq: Every day | RESPIRATORY_TRACT | 0 refills | Status: DC

## 2021-02-08 NOTE — Progress Notes (Signed)
Arthur Harper, male    DOB: Jun 05, 1945,    MRN: [id number]   Brief patient profile:  54 yowm quit smoking 2010 @ wt 180  referred to pulmonary clinic 02/08/2021 [facility] with GOLD 2 criteria in 05/2010.       History of Present Illness  02/08/2021  Pulmonary/ 1st office eval/Arthur Harper  Chief Complaint  Patient presents with   Consult    Referred by Dr. Gwenette Harper from [facility]  Dyspnea:  [place] stops every 5-10 min no routine  Cough: tickle in throat for more than a year or two, min clear  mucus prod/ slt hoarse Sleep: cpap per Clance  SABA use: once or twice a month hfa/ no neb   No obvious day to day or daytime variability or assoc excess/ purulent sputum or mucus plugs or hemoptysis or cp or chest tightness, subjective wheeze or overt sinus or hb symptoms.   Sleeping as above  without nocturnal  or early am exacerbation  of respiratory  c/o's or need for noct saba. Also denies any obvious fluctuation of symptoms with weather or environmental changes or other aggravating or alleviating factors except as outlined above   No unusual exposure hx or h/o childhood pna/ asthma or knowledge of premature birth.  Current Allergies, Complete Past Medical History, Past Surgical History, Family History, and Social History were reviewed in [employer] record.  ROS  The following are not active complaints unless bolded Hoarseness, sore throat, dysphagia, dental problems, itching, sneezing,  nasal congestion or discharge of excess mucus or purulent secretions, ear ache,   fever, chills, sweats, unintended wt loss or wt gain, classically pleuritic or exertional cp,  orthopnea pnd or arm/hand swelling  or leg swelling, presyncope, palpitations, abdominal pain, anorexia, nausea, vomiting, diarrhea  or change in bowel habits or change in bladder habits, change in stools or change in urine, dysuria, hematuria,  rash, arthralgias,  visual complaints, headache, numbness, weakness or ataxia or problems with walking or coordination,  change in mood or  memory.             Past Medical History:  Diagnosis Date   Agent orange exposure    COPD (chronic obstructive pulmonary disease) ([place])    PTSD (post-traumatic stress disorder)    Sleep apnea     Outpatient Medications Prior to Visit  Medication Sig Dispense Refill   albuterol (PROVENTIL HFA;VENTOLIN HFA) 108 (90 BASE) MCG/ACT inhaler Inhale 2 puffs into the lungs every 6 (six) hours as needed.     aspirin 81 MG chewable tablet Chew 81 mg by mouth daily.     fluticasone-salmeterol (WIXELA INHUB) 250-50 MCG/ACT AEPB Itaking 2 at hs      Multiple Vitamin (MULTIVITAMIN) capsule Take 1 capsule by mouth daily.     terazosin (HYTRIN) 5 MG capsule Take 5 mg by mouth at bedtime.      Stiolto  2 puffs in am                Objective:     BP 130/82 (BP Location: Left Arm, Cuff Size: Normal)   Pulse 63   Temp 97.8 F (36.6 C)   Ht 5\' 9"  (1.753 m)   Wt 220 lb (99.8 kg)   SpO2 98% Comment: RA  BMI 32.49 kg/m   SpO2: 98 % (RA)  Pleasant amb mildly obese wm nad/ easily confused with names of meds  HEENT : pt wearing  mask not removed for exam due to covid - 19 concerns.   NECK :  without JVD/Nodes/TM/ nl carotid upstrokes bilaterally   LUNGS: no acc muscle use,  Min barrel  contour chest wall with bilateral  slightly decreased bs s audible wheeze and  without cough on insp or exp maneuvers and min  Hyperresonant  to  percussion bilaterally     CV:  RRR  no s3 or murmur or increase in P2, and no edema   ABD:  soft and nontender with pos end  insp Hoover's  in the supine position. No bruits or organomegaly appreciated, bowel sounds nl  MS:   Nl gait/  ext warm without deformities, calf tenderness, cyanosis or clubbing No obvious joint restrictions   SKIN: warm and dry without lesions    NEURO:  alert, approp, nl sensorium with  no motor or cerebellar  deficits apparent.       CXR PA and Lateral:   02/08/2021 :    I personally reviewed images / impression as follows:    Mild copd   Labs ordered/ reviewed:      Chemistry      Component Value Date/Time   NA 139 02/08/2021 0936   K 4.3 02/08/2021 0936   CL 103 02/08/2021 0936   CO2 30 02/08/2021 0936   BUN 14 02/08/2021 0936   CREATININE 0.93 02/08/2021 0936      Component Value Date/Time   CALCIUM 9.4 02/08/2021 0936   ALKPHOS 64 08/14/2012 1158   AST 23 08/14/2012 1158   ALT 23 08/14/2012 1158   BILITOT 1.3 (H) 08/14/2012 1158        Lab Results  Component Value Date   WBC 5.5 02/08/2021   HGB 13.9 02/08/2021   HCT 41.8 02/08/2021   MCV 99.0 02/08/2021   PLT 254.0 02/08/2021       EOS                                                              0.1                                    01/29/21   Lab Results  Component Value Date   DDIMER 0.70 (H) 02/08/2021      Lab Results  Component Value Date   TSH 3.43 02/08/2021     Lab Results  Component Value Date   PROBNP 109.0 (H) 02/08/2021      Labs ordered 02/08/2021  :  allergy profile   alpha one AT phenotype       Assessment   COPD  GOLD 2  Quit smoking 2010  - Spirometry 02/08/2021  FEV1 1.94 (59%)  Ratio 0.50 p 30% improvement from saba   - 02/08/2021   Walked on RA x  3  lap(s) =  approx 750 @ nl pace, stopped due to end of study but sob p one lap with lowest 02 sats 95%  - 02/08/2021  After extensive coaching inhaler device,  effectiveness =  80% with smi  - Labs ordered 02/08/2021  :  allergy profile   alpha one AT phenotype     When respiratory symptoms begin or become refractory well  after a patient reports complete smoking cessation,  Especially when this wasn't the case while they were smoking, a red flag is raised based on the work of Dr Kris Mouton which states:  if you quit smoking when your best day FEV1 is still well preserved it is highly unlikely you will progress to severe  disease.  That is to say, once the smoking stops,  the symptoms should not suddenly erupt or markedly worsen.  If so, the differential diagnosis should include  obesity/deconditioning,  LPR/Reflux/Aspiration syndromes,  occult CHF, or  especially side effect of medications commonly used in this population.  I am concerned about [facility] and about dpi use in with hoarseness an atypical presentation so will try to simplify rx if possible and ask pt to return with all meds in hand using a trust but verify approach to confirm accurate Medication  Reconciliation The principal here is that until we are certain that the  patients are doing what we've asked, it makes no sense to ask them to do more.     Since clearly does have copd by prior pft's,  For now will assume  Group D in terms of symptom/risk and laba/lama/ICS  therefore appropriate rx at this point >>>  Resume symbicort 160 2bid and spiriva smi 2.5 2 each am or whatever the [place] has that is the equivalent in non-dpi form and return for repeat pfts  Each maintenance medication was reviewed in detail including emphasizing most importantly the difference between maintenance and prns and under what circumstances the prns are to be triggered using an action plan format where appropriate.  Total time for H and P, chart review, counseling, reviewing hfa/smi device(s) , directly observing portions of ambulatory 02 saturation study/ and generating customized AVS unique to this office visit / same day charting = 8min                 Christinia Gully, MD 02/08/2021

## 2021-02-08 NOTE — Patient Instructions (Addendum)
Plan A = Automatic = Always=   Symbicort  Take 2 puffs first thing in am and then another 2 puffs about 12 hours later and spiriva 2 puffs in am   Work on inhaler technique:  relax and gently blow all the way out then take a nice smooth full deep breath back in, triggering the inhaler at same time you start breathing in.  Hold for up to 5 seconds if you can. Blow symbicort out thru nose. Rinse and gargle with water when done.  If mouth or throat bother you at all,  try brushing teeth/gums/tongue with arm and hammer toothpaste/ make a slurry and gargle and spit out.      Plan B = Backup (to supplement plan A, not to replace it) Only use your albuterol inhaler as a rescue medication to be used if you can't catch your breath by resting or doing a relaxed purse lip breathing pattern.  - The less you use it, the better it will work when you need it. - Ok to use the inhaler up to 2 puffs  every 4 hours if you must but call for appointment if use goes up over your usual need - Don't leave home without it !!  (think of it like the spare tire for your car)   Ok to try albuterol 15 min before an activity (on alternating days)  that you know would usually make you short of breath and see if it makes any difference and if makes none then don't take albuterol after activity unless you can't catch your breath as this means it's the resting that helps, not the albuterol.     Please remember to go to the lab and x-ray department  for your tests - we will call you with the results when they are available.   Please schedule a follow up office visit in 6 weeks with PFTs on return

## 2021-02-09 ENCOUNTER — Encounter: Payer: Self-pay | Admitting: Internal Medicine

## 2021-02-09 ENCOUNTER — Other Ambulatory Visit (HOSPITAL_COMMUNITY): Payer: Self-pay

## 2021-02-09 ENCOUNTER — Telehealth: Payer: Self-pay | Admitting: Pharmacy Technician

## 2021-02-09 MED ORDER — INCRUSE ELLIPTA 62.5 MCG/ACT IN AEPB: 1.0000 | INHALATION_SPRAY | Freq: Every day | RESPIRATORY_TRACT | 11 refills | Status: DC

## 2021-02-09 NOTE — Assessment & Plan Note (Addendum)
Quit smoking 2010  - Spirometry 02/08/2021  FEV1 1.94 (59%)  Ratio 0.50 p 30% improvement from saba   - 02/08/2021   Walked on RA x  3  lap(s) =  approx 750 @ nl pace, stopped due to end of study but sob p one lap with lowest 02 sats 95%  - 02/08/2021  After extensive coaching inhaler device,  effectiveness =  80% with smi  - Labs ordered 02/08/2021  :  allergy profile   alpha one AT phenotype     When respiratory symptoms begin or become refractory well after a patient reports complete smoking cessation,  Especially when this wasn't the case while they were smoking, a red flag is raised based on the work of Dr Kris Mouton which states:  if you quit smoking when your best day FEV1 is still well preserved it is highly unlikely you will progress to severe disease.  That is to say, once the smoking stops,  the symptoms should not suddenly erupt or markedly worsen.  If so, the differential diagnosis should include  obesity/deconditioning,  LPR/Reflux/Aspiration syndromes,  occult CHF, or  especially side effect of medications commonly used in this population.  I am concerned about medication reconciliation [facility] and about dpi use in with hoarseness an atypical presentation so will try to simplify rx if possible and ask pt to return with all meds in hand using a trust but verify approach to confirm accurate Medication  Reconciliation The principal here is that until we are certain that the  patients are doing what we've asked, it makes no sense to ask them to do more.     Since clearly does have copd by prior pft's,  For now will assume  Group D in terms of symptom/risk and laba/lama/ICS  therefore appropriate rx at this point >>>  Resume symbicort 160 2bid and spiriva smi 2.5 2 each am or whatever the [place] has that is the equivalent in non-dpi form and return for repeat pfts  Each maintenance medication was reviewed in detail including emphasizing most importantly the difference between  maintenance and prns and under what circumstances the prns are to be triggered using an action plan format where appropriate.  Total time for H and P, chart review, counseling, reviewing hfa/smi device(s) , directly observing portions of ambulatory 02 saturation study/ and generating customized AVS unique to this office visit / same day charting = 26min

## 2021-02-09 NOTE — Telephone Encounter (Signed)
Dr. Wert, please advise. Thanks!  

## 2021-02-09 NOTE — Telephone Encounter (Signed)
Patient Advocate Encounter  Received notification from PATIENT that prior authorization for [facility] is required.   PA submitted on 10.21.22 [place]  [id number] Status is pending   [facility] will continue to follow  Luciano Cutter, CPhT Patient Advocate Phone: [phone] Fax:  [phone]

## 2021-02-09 NOTE — Telephone Encounter (Signed)
Received a fax regarding Prior Authorization from [place] for [facility]. Authorization has been DENIED because:   Preferred alternatives that patient has not tried and failed would be the Incruse Ellipta (test claim $47 co-pay) and Atrovent HFA (test claim $100 co-pay).

## 2021-02-09 NOTE — Telephone Encounter (Signed)
Ok to try incruse one click in am in place of spiriva

## 2021-02-09 NOTE — Telephone Encounter (Signed)
Prior authorization has been submitted for Spiriva. Preferred alternatives that patient has not tried and failed would be the Incruse Ellipta (test claim $47 co-pay) and Atrovent HFA (test claim $100 co-pay). Not sure what co-pay will be for Spiriva as the PA is pending. MIW:[id number]

## 2021-02-09 NOTE — Addendum Note (Signed)
Addended by: Claudette Head A on: 02/09/2021 02:47 PM   Modules accepted: Orders

## 2021-02-09 NOTE — Telephone Encounter (Signed)
Patient is aware of below message and voiced his understanding.  Rx for Incruse has been sent to preferred pharmacy. Nothing further needed at this time.

## 2021-02-12 LAB — IGE

## 2021-02-12 LAB — D-DIMER, QUANTITATIVE: D-Dimer, Quant: 0.7 mcg/mL FEU — ABNORMAL HIGH (ref <0.50)

## 2021-02-14 LAB — ALPHA-1-ANTITRYPSIN PHENOTYP: A-1 Antitrypsin: 167 mg/dL (ref 101–187)

## 2021-02-19 ENCOUNTER — Telehealth: Payer: Self-pay | Admitting: Internal Medicine

## 2021-02-20 NOTE — Telephone Encounter (Signed)
Yes needs the symbicort, can't to dry powder

## 2021-02-20 NOTE — Telephone Encounter (Signed)
Called and spoke to pharmacist with the [facility] and was advised Arthur Harper is preferred while Symbicort is not. The pharmacist is questioning if the pt can tolerate a dry powder and if he can be switched to [facility], if not then he will document and see if Symbicort can be covered. Pt last seen on 02/08/2021 for COPD.    Dr. Melvyn Novas, please advise. Thanks.

## 2021-02-20 NOTE — Telephone Encounter (Signed)
Called and spoke with Nicole Kindred in the pharmacy to let him know that the patient does need Symbicort. He expressed understanding. Nothing further needed at this time.

## 2021-02-27 NOTE — Telephone Encounter (Signed)
See phone note from 02/09/21. Will close encounter.

## 2021-03-19 ENCOUNTER — Other Ambulatory Visit: Payer: Self-pay | Admitting: Internal Medicine

## 2021-03-19 LAB — SARS CORONAVIRUS 2 (TAT 6-24 HRS): SARS Coronavirus 2: NEGATIVE

## 2021-03-22 ENCOUNTER — Encounter: Payer: Self-pay | Admitting: Internal Medicine

## 2021-03-22 ENCOUNTER — Ambulatory Visit (INDEPENDENT_AMBULATORY_CARE_PROVIDER_SITE_OTHER): Payer: No Typology Code available for payment source | Admitting: Primary Care

## 2021-03-22 ENCOUNTER — Other Ambulatory Visit: Payer: Self-pay

## 2021-03-22 ENCOUNTER — Ambulatory Visit (INDEPENDENT_AMBULATORY_CARE_PROVIDER_SITE_OTHER): Payer: No Typology Code available for payment source | Admitting: Internal Medicine

## 2021-03-22 ENCOUNTER — Encounter: Payer: Self-pay | Admitting: Primary Care

## 2021-03-22 DIAGNOSIS — J449 Chronic obstructive pulmonary disease, unspecified: Secondary | ICD-10-CM

## 2021-03-22 DIAGNOSIS — R079 Chest pain, unspecified: Secondary | ICD-10-CM | POA: Diagnosis not present

## 2021-03-22 LAB — PULMONARY FUNCTION TEST
DL/VA % pred: 76 %
DL/VA: 3.07 ml/min/mmHg/L
DLCO cor % pred: 82 %
DLCO cor: 18.54 ml/min/mmHg
DLCO unc % pred: 80 %
DLCO unc: 18.17 ml/min/mmHg
FEF 25-75 Post: 1 L/sec
FEF 25-75 Pre: 0.73 L/sec
FEF2575-%Change-Post: 36 %
FEF2575-%Pred-Post: 52 %
FEF2575-%Pred-Pre: 38 %
FEV1-%Change-Post: 13 %
FEV1-%Pred-Post: 72 %
FEV1-%Pred-Pre: 63 %
FEV1-Post: 1.9 L
FEV1-Pre: 1.67 L
FEV1FVC-%Change-Post: 5 %
FEV1FVC-%Pred-Pre: 75 %
FEV6-%Change-Post: 10 %
FEV6-%Pred-Post: 95 %
FEV6-%Pred-Pre: 85 %
FEV6-Post: 3.25 L
FEV6-Pre: 2.93 L
FEV6FVC-%Change-Post: 1 %
FEV6FVC-%Pred-Post: 105 %
FEV6FVC-%Pred-Pre: 104 %
FVC-%Change-Post: 7 %
FVC-%Pred-Post: 90 %
FVC-%Pred-Pre: 83 %
FVC-Post: 3.3 L
FVC-Pre: 3.07 L
Post FEV1/FVC ratio: 57 %
Post FEV6/FVC ratio: 98 %
Pre FEV1/FVC ratio: 55 %
Pre FEV6/FVC Ratio: 97 %
RV % pred: 144 %
RV: 3.4 L
TLC % pred: 108 %
TLC: 6.9 L

## 2021-03-22 MED ORDER — ALBUTEROL SULFATE HFA 108 (90 BASE) MCG/ACT IN AERS: 2.0000 | INHALATION_SPRAY | Freq: Four times a day (QID) | RESPIRATORY_TRACT | 2 refills | Status: AC | PRN

## 2021-03-22 MED ORDER — MONTELUKAST SODIUM 10 MG PO TABS: 10.0000 mg | ORAL_TABLET | Freq: Every day | ORAL | 11 refills | Status: DC

## 2021-03-22 NOTE — Patient Instructions (Addendum)
Recommendations: - Continue Symbicort (budesonide-formoterol) 2 puffs morning and evening (do not use more than prescribed and rinse mouth after use) - Continue Spiriva Respimat 2 puffs every morning (do not use more than prescribed) - Use albuterol (Ventolin) rescue inhaler 2 puffs every 6 hours only as needed for breakthrough shortness of breath or wheezing - Start Singulair (montelukast) 10 mg at bedtime - Take Mucinex (over-the-counter) 600 mg twice daily as needed for chest congestion  Orders: - Exercise stress test re: shortness of breath/chest pain  Follow-up: - 3 months with Dr. Melvyn Novas or sooner if needed

## 2021-03-22 NOTE — Progress Notes (Signed)
PFT done today. 

## 2021-03-22 NOTE — Progress Notes (Signed)
_0  ID: Arthur Harper, male    DOB: June 03, 1945, 75 y.o.   MRN: [id number]  Chief Complaint  Patient presents with   Follow-up    Referring provider: Kathee Delton, MD  HPI: 75 year old male, former smoker. PMH significant for COPD mixed type, pulmonary nodule, sleep apnea, GERD, PTSD. Patient of Dr. Melvyn Novas, seen for initial consult on 02/08/21.   Previous [facility] pulmonary encounter: 02/08/2021  Pulmonary/ 1st office eval/[facility]  Chief Complaint  Patient presents with   Consult    Referred by Dr. Gwenette Greet from [facility]  Dyspnea:  has [place] stops every 5-10 min no routine  Cough: tickle in throat for more than a year or two, min clear  mucus prod/ slt hoarse Sleep: cpap per [facility]  SABA use: once or twice a month hfa/ no neb   No obvious day to day or daytime variability or assoc excess/ purulent sputum or mucus plugs or hemoptysis or cp or chest tightness, subjective wheeze or overt sinus or hb symptoms.   Sleeping as above  without nocturnal  or early am exacerbation  of respiratory  c/o's or need for noct saba. Also denies any obvious fluctuation of symptoms with weather or environmental changes or other aggravating or alleviating factors except as outlined above   No unusual exposure hx or h/o childhood pna/ asthma or knowledge of premature birth.  03/22/2021- Interim hx  Patient presents today for follow-up. He has symptoms of dyspnea with exertion, chest congestion and cough. Cough is productive, at times he has difficult time getting mucus up. He has some associated voice hoarsenes and post nasal drip. He is currently using Symbicort 160 and Spiriva respimat provided by the [place]. He will occasionally experience left sided chest pain with exertion. No active chest discomfort today.  Pulmonary function testing: 03/22/2021 PFTs- FVC 3.30 (90%), FEV1 1.90 (72%), ratio 57, DLCOcor 18.17 (80%) Moderate obstructive airway disease with positive BD  response. Normal diffusion capacity. Flow volume loop with curvature.   02/08/21 Spirometry - FEV1 1.94 (59%), ratio 0.50 p 30% improvement from saba   02/08/21 Alpha 1 MM, 167   No Known Allergies  Immunization History  Administered Date(s) Administered   Fluad Quad(high Dose 65+) 02/21/2021   Influenza Split 01/21/2011, 02/17/2012   Influenza Whole 01/20/2009   Influenza-Unspecified 12/21/2012   Pneumococcal Conjugate-13 02/02/2014   Pneumococcal Polysaccharide-23 04/23/2007, 01/06/2008, 08/14/2012, 01/27/2013, 08/20/2016   Td 04/23/2004   Tdap 09/21/2006, 09/18/2011   Varicella 04/22/2009   Zoster Recombinat (Shingrix) 02/10/2019, 04/12/2019   Zoster, Live 02/02/2014    Past Medical History:  Diagnosis Date   Agent orange exposure    COPD (chronic obstructive pulmonary disease) ([place])    PTSD (post-traumatic stress disorder)    Sleep apnea     Tobacco History: Social History   Tobacco Use  Smoking Status Former   Packs/day: 1.00   Years: 40.00   Pack years: 40.00   Types: Cigarettes   Quit date: 12/31/2008   Years since quitting: 12.2  Smokeless Tobacco Never  Tobacco Comments   10 cigs per day   Counseling given: Not Answered Tobacco comments: 10 cigs per day   Outpatient Medications Prior to Visit  Medication Sig Dispense Refill   aspirin 81 MG chewable tablet Chew 81 mg by mouth daily.     budesonide-formoterol (SYMBICORT) 160-4.5 MCG/ACT inhaler Take 2 puffs first thing in am and then another 2 puffs about 12 hours later. 1 each 11  Multiple Vitamin (MULTIVITAMIN) capsule Take 1 capsule by mouth daily.     terazosin (HYTRIN) 5 MG capsule Take 5 mg by mouth at bedtime.     Tiotropium Bromide Monohydrate (SPIRIVA RESPIMAT) 2.5 MCG/ACT AERS Inhale 2 puffs into the lungs daily.     albuterol (PROVENTIL HFA;VENTOLIN HFA) 108 (90 BASE) MCG/ACT inhaler Inhale 2 puffs into the lungs every 6 (six) hours as needed. (Patient not taking: Reported on 03/22/2021)      umeclidinium bromide (INCRUSE ELLIPTA) 62.5 MCG/ACT AEPB Inhale 1 puff into the lungs daily. (Patient not taking: Reported on 03/22/2021) 30 each 11   No facility-administered medications prior to visit.    Review of Systems  Review of Systems  Constitutional: Negative.   HENT:  Positive for congestion and postnasal drip.   Respiratory:  Positive for cough and shortness of breath. Negative for chest tightness.   Cardiovascular: Negative.     Physical Exam  BP 120/60 (BP Location: Left Arm, Patient Position: Sitting, Cuff Size: Normal)   Pulse 68   Temp 98.1 F (36.7 C) (Oral)   Ht 5' 6.5" (1.689 m)   Wt 213 lb 3.2 oz (96.7 kg)   SpO2 96%   BMI 33.90 kg/m  Physical Exam Constitutional:      Appearance: Normal appearance.  HENT:     Head: Normocephalic and atraumatic.     Mouth/Throat:     Mouth: Mucous membranes are moist.     Pharynx: Oropharynx is clear.  Cardiovascular:     Rate and Rhythm: Normal rate and regular rhythm.  Pulmonary:     Effort: Pulmonary effort is normal.     Breath sounds: Normal breath sounds. No wheezing, rhonchi or rales.  Musculoskeletal:        General: Normal range of motion.  Skin:    General: Skin is warm and dry.  Neurological:     General: No focal deficit present.     Mental Status: He is alert and oriented to person, place, and time. Mental status is at baseline.  Psychiatric:        Mood and Affect: Mood normal.        Behavior: Behavior normal.        Thought Content: Thought content normal.        Judgment: Judgment normal.     Lab Results:  CBC    Component Value Date/Time   WBC 5.5 02/08/2021 0936   RBC 4.22 02/08/2021 0936   HGB 13.9 02/08/2021 0936   HCT 41.8 02/08/2021 0936   PLT 254.0 02/08/2021 0936   MCV 99.0 02/08/2021 0936   MCHC 33.3 02/08/2021 0936   RDW 13.3 02/08/2021 0936   LYMPHSABS 1.1 02/08/2021 0936   MONOABS 0.5 02/08/2021 0936   EOSABS 0.2 02/08/2021 0936   BASOSABS 0.0 02/08/2021 0936     BMET    Component Value Date/Time   NA 139 02/08/2021 0936   K 4.3 02/08/2021 0936   CL 103 02/08/2021 0936   CO2 30 02/08/2021 0936   GLUCOSE 93 02/08/2021 0936   BUN 14 02/08/2021 0936   CREATININE 0.93 02/08/2021 0936   CALCIUM 9.4 02/08/2021 0936   GFRNONAA 103.43 02/21/2010 1634   GFRAA  12/19/2008 0500    >60        The eGFR has been calculated using the MDRD equation. This calculation has not been validated in all clinical situations. eGFR's persistently <60 mL/min signify possible Chronic Kidney Disease.    BNP No results found for:  BNP  ProBNP    Component Value Date/Time   PROBNP 109.0 (H) 02/08/2021 0936    Imaging: No results found.   Assessment & Plan:   COPD  GOLD 2  Former smoker, quit in 2010. Alpha-1 level MM. Eosinophils 200. PFTs on 03/22/2021 FEV 1.90 (72%), ratio 75 with positive BD response - Continue Symbicort 160 2 puffs morning and evening and Spiriva Respimat 2 puffs every morning (patient gets medications through [place]) - Use albuterol (Ventolin) rescue inhaler 2 puffs every 6 hours only as needed for breakthrough shortness of breath or wheezing - Recommend starting Singulair (montelukast) 10 mg at bedtime and Mucinex 600 mg twice daily as needed for chest congestion  Exertional chest pain - Ordered for exercise stress test d/t dyspnea and exertional CP   FU in 3 month or sooner if needed  Martyn Ehrich, NP 03/26/2021

## 2021-03-26 DIAGNOSIS — R079 Chest pain, unspecified: Secondary | ICD-10-CM | POA: Insufficient documentation

## 2021-03-26 NOTE — Assessment & Plan Note (Addendum)
Former smoker, quit in 2010. Alpha-1 level MM. Eosinophils 200. PFTs on 03/22/2021 FEV 1.90 (72%), ratio 75 with positive BD response - Continue Symbicort 160 2 puffs morning and evening and Spiriva Respimat 2 puffs every morning (patient gets medications through [place]) - Use albuterol (Ventolin) rescue inhaler 2 puffs every 6 hours only as needed for breakthrough shortness of breath or wheezing - Recommend starting Singulair (montelukast) 10 mg at bedtime and Mucinex 600 mg twice daily as needed for chest congestion

## 2021-03-26 NOTE — Assessment & Plan Note (Signed)
-   Ordered for exercise stress test d/t dyspnea and exertional CP

## 2021-05-01 ENCOUNTER — Telehealth: Payer: Self-pay | Admitting: Internal Medicine

## 2021-05-01 DIAGNOSIS — R079 Chest pain, unspecified: Secondary | ICD-10-CM

## 2021-05-02 NOTE — Telephone Encounter (Signed)
Order sent by EW. Will forward to EW.

## 2021-05-02 NOTE — Telephone Encounter (Signed)
Order has been placed for the stress test. Nothing further needed.

## 2021-05-02 NOTE — Telephone Encounter (Signed)
Noted. Stress test. Ordered. Will route message to provider to ensure correct order has been placed.   MW please advise if this is the correct order for stress test. Thanks

## 2021-05-02 NOTE — Telephone Encounter (Signed)
Yes

## 2021-05-03 ENCOUNTER — Telehealth: Payer: Self-pay | Admitting: Internal Medicine

## 2021-05-03 DIAGNOSIS — R079 Chest pain, unspecified: Secondary | ICD-10-CM

## 2021-05-03 NOTE — Telephone Encounter (Signed)
Staff message received: ATTESTATION FOR STRESS ECHO Received: Hyman Bible  Salaya Holtrop, Waldemar Dickens, CMA Good Afternoon,   Please create and ATTESTATION  (order# [phone]) and have MD to sign in order to schedule stress echocardiogram. Please note that Verbal cosign is not accepted must be signed! :) Thank you!    Routing to Dr. Melvyn Novas. If needed, please refer to phone encounter from 05/01/21.

## 2021-05-03 NOTE — Telephone Encounter (Signed)
Order placed under Beth. Beth- were you wanting an exercise tolerance test or a stress echo? Cardio pulmonary stress? Please let us know thanks!

## 2021-05-03 NOTE — Telephone Encounter (Signed)
I did not order a stress echo

## 2021-05-04 ENCOUNTER — Other Ambulatory Visit: Payer: Self-pay | Admitting: Primary Care

## 2021-05-04 DIAGNOSIS — R079 Chest pain, unspecified: Secondary | ICD-10-CM

## 2021-05-04 NOTE — Telephone Encounter (Signed)
Ok, thank you- it's prompting me to advise whether you want dobutamine or treadmill   Also I will need you to sign an attestation see note below:  ATTENTION   Cardiac stress tests require informed consent by the ordering provider prior to exam which can be completed by electronic order.   [id number]-Cardiac Stress Test: Informed Consent Details: Physician/Practitioner Attestation; Transcribe to consent form and obtain patient signature, orders can be pended for provider signature/attestation.

## 2021-05-04 NOTE — Telephone Encounter (Signed)
Treadmill if no physical limitation

## 2021-05-04 NOTE — Telephone Encounter (Signed)
Either would be fine but I prefer an exercise stress test

## 2021-05-07 NOTE — Progress Notes (Signed)
Ordered signed.

## 2021-05-08 ENCOUNTER — Telehealth (HOSPITAL_COMMUNITY): Payer: Self-pay | Admitting: *Deleted

## 2021-05-08 NOTE — Telephone Encounter (Signed)
Close encounter 

## 2021-05-09 ENCOUNTER — Ambulatory Visit (HOSPITAL_COMMUNITY)
Admission: RE | Admit: 2021-05-09 | Discharge: 2021-05-09 | Disposition: A | Discharge status: Home / Self Care | Payer: No Typology Code available for payment source | Source: Ambulatory Visit | Attending: Cardiovascular Disease | Admitting: Cardiovascular Disease

## 2021-05-09 ENCOUNTER — Other Ambulatory Visit: Payer: Self-pay

## 2021-05-09 DIAGNOSIS — R079 Chest pain, unspecified: Secondary | ICD-10-CM | POA: Insufficient documentation

## 2021-05-09 LAB — EXERCISE TOLERANCE TEST
Angina Index: 0
Base ST Depression (mm): 0 mm
Duke Treadmill Score: 7
Estimated workload: 7.9
Exercise duration (min): 6 min
Exercise duration (sec): 39 s
MPHR: 145 {beats}/min
Peak HR: 129 {beats}/min
Percent HR: 88 %
Rest HR: 64 {beats}/min
ST Depression (mm): 0 mm

## 2021-05-14 ENCOUNTER — Telehealth: Payer: Self-pay | Admitting: Primary Care

## 2021-05-15 ENCOUNTER — Other Ambulatory Visit: Payer: Self-pay

## 2021-05-15 ENCOUNTER — Emergency Department (HOSPITAL_BASED_OUTPATIENT_CLINIC_OR_DEPARTMENT_OTHER): Payer: No Typology Code available for payment source

## 2021-05-15 ENCOUNTER — Encounter (HOSPITAL_BASED_OUTPATIENT_CLINIC_OR_DEPARTMENT_OTHER): Payer: Self-pay

## 2021-05-15 ENCOUNTER — Emergency Department (HOSPITAL_BASED_OUTPATIENT_CLINIC_OR_DEPARTMENT_OTHER)
Admission: EM | Admit: 2021-05-15 | Discharge: 2021-05-15 | Disposition: A | Discharge status: Home / Self Care | Payer: No Typology Code available for payment source | Attending: Emergency Medicine | Admitting: Emergency Medicine

## 2021-05-15 DIAGNOSIS — Z7982 Long term (current) use of aspirin: Secondary | ICD-10-CM | POA: Diagnosis not present

## 2021-05-15 DIAGNOSIS — Z79899 Other long term (current) drug therapy: Secondary | ICD-10-CM | POA: Insufficient documentation

## 2021-05-15 DIAGNOSIS — R519 Headache, unspecified: Secondary | ICD-10-CM | POA: Diagnosis present

## 2021-05-15 DIAGNOSIS — R110 Nausea: Secondary | ICD-10-CM | POA: Insufficient documentation

## 2021-05-15 DIAGNOSIS — G44209 Tension-type headache, unspecified, not intractable: Secondary | ICD-10-CM | POA: Insufficient documentation

## 2021-05-15 DIAGNOSIS — M542 Cervicalgia: Secondary | ICD-10-CM | POA: Diagnosis not present

## 2021-05-15 LAB — CBC WITH DIFFERENTIAL/PLATELET
Abs Immature Granulocytes: 0.04 10*3/uL (ref 0.00–0.07)
Basophils Absolute: 0 10*3/uL (ref 0.0–0.1)
Basophils Relative: 1 %
Eosinophils Absolute: 0.3 10*3/uL (ref 0.0–0.5)
Eosinophils Relative: 4 %
HCT: 44 % (ref 39.0–52.0)
Hemoglobin: 14.9 g/dL (ref 13.0–17.0)
Immature Granulocytes: 1 %
Lymphocytes Relative: 22 %
Lymphs Abs: 1.5 10*3/uL (ref 0.7–4.0)
MCH: 32.5 pg (ref 26.0–34.0)
MCHC: 33.9 g/dL (ref 30.0–36.0)
MCV: 95.9 fL (ref 80.0–100.0)
Monocytes Absolute: 0.5 10*3/uL (ref 0.1–1.0)
Monocytes Relative: 7 %
Neutro Abs: 4.6 10*3/uL (ref 1.7–7.7)
Neutrophils Relative %: 65 %
Platelets: 250 10*3/uL (ref 150–400)
RBC: 4.59 MIL/uL (ref 4.22–5.81)
RDW: 11.9 % (ref 11.5–15.5)
WBC: 7 10*3/uL (ref 4.0–10.5)
nRBC: 0 % (ref 0.0–0.2)

## 2021-05-15 LAB — BASIC METABOLIC PANEL
Anion gap: 9 (ref 5–15)
BUN: 20 mg/dL (ref 8–23)
CO2: 26 mmol/L (ref 22–32)
Calcium: 9.6 mg/dL (ref 8.9–10.3)
Chloride: 104 mmol/L (ref 98–111)
Creatinine, Ser: 0.92 mg/dL (ref 0.61–1.24)
GFR, Estimated: >60 mL/min (ref >60)
Glucose, Bld: 84 mg/dL (ref 70–99)
Potassium: 4.2 mmol/L (ref 3.5–5.1)
Sodium: 139 mmol/L (ref 135–145)

## 2021-05-15 MED ORDER — CYCLOBENZAPRINE HCL 10 MG PO TABS: 10.0000 mg | ORAL_TABLET | Freq: Two times a day (BID) | ORAL | 0 refills | Status: DC | PRN

## 2021-05-15 MED ORDER — IOHEXOL 350 MG/ML SOLN
80.0000 mL | Freq: Once | INTRAVENOUS | Status: AC | PRN
  Administered 2021-05-15: 21:00:00 80 mL via INTRAVENOUS

## 2021-05-15 MED ORDER — KETOROLAC TROMETHAMINE 30 MG/ML IJ SOLN
30.0000 mg | Freq: Once | INTRAMUSCULAR | Status: AC
  Administered 2021-05-15: 20:00:00 30 mg via INTRAVENOUS
  Filled 2021-05-15: qty 1

## 2021-05-15 NOTE — Telephone Encounter (Signed)
Called and spoke with patient. He verbalized understanding and wrote down the names for the EDs.   Nothing further needed at time of call.

## 2021-05-15 NOTE — ED Provider Notes (Signed)
[facility] EMERGENCY DEPT Provider Note   CSN: [id number] Arrival date & time: 05/15/21  1714     History  Chief Complaint  Patient presents with   Headache    Arthur Harper is a 76 y.o. male presenting to the emergency department with headache.  He reports gradual onset of posterior in the left neck pain and posterior headache pain several weeks ago.  He says it has been persistent, aching, throbbing on a daily basis, sometimes radiates sharply into his left jaw.  He denies any trauma, neck manipulation, chiropractor visit or masseuse prior to that.  He denies any changes in vision.  He does report there is a history of pulmonary nodule but denies any recent CT imaging of the brain.  He denies any balance difficulties.  He is here with his domestic partner at bedside.  Symptoms are sometimes worse with lateral head movement and neck movement.  Respond somewhat to ibuprofen, although he has been taking low doses only sparingly.  HPI     Home Medications Prior to Admission medications   Medication Sig Start Date End Date Taking? Authorizing Provider  cyclobenzaprine (FLEXERIL) 10 MG tablet Take 1 tablet (10 mg total) by mouth 2 (two) times daily as needed for up to 20 doses for muscle spasms. 05/15/21  Yes Pharrah Rottman, Carola Rhine, MD  albuterol (VENTOLIN HFA) 108 (90 Base) MCG/ACT inhaler Inhale 2 puffs into the lungs every 6 (six) hours as needed. 03/22/21   Martyn Ehrich, NP  aspirin 81 MG chewable tablet Chew 81 mg by mouth daily.    [provider]  budesonide-formoterol (SYMBICORT) 160-4.5 MCG/ACT inhaler Take 2 puffs first thing in am and then another 2 puffs about 12 hours later. 02/08/21   Tanda Rockers, MD  montelukast (SINGULAIR) 10 MG tablet Take 1 tablet (10 mg total) by mouth at bedtime. 03/22/21   Martyn Ehrich, NP  Multiple Vitamin (MULTIVITAMIN) capsule Take 1 capsule by mouth daily.    [provider]  terazosin (HYTRIN) 5 MG  capsule Take 5 mg by mouth at bedtime.    [provider]  Tiotropium Bromide Monohydrate (SPIRIVA RESPIMAT) 2.5 MCG/ACT AERS Inhale 2 puffs into the lungs daily.    [provider]      Allergies    Patient has no known allergies.    Review of Systems   Review of Systems  Physical Exam Updated Vital Signs BP (!) 142/100    Pulse (!) 56    Temp 98 F (36.7 C) (Oral)    Resp 19    Ht 5\' 8"  (1.727 m)    Wt 98.4 kg    SpO2 97%    BMI 32.99 kg/m  Physical Exam Constitutional:      General: He is not in acute distress. HENT:     Head: Normocephalic and atraumatic.  Eyes:     General: No visual field deficit.    Conjunctiva/sclera: Conjunctivae normal.     Pupils: Pupils are equal, round, and reactive to light.  Cardiovascular:     Rate and Rhythm: Normal rate and regular rhythm.  Pulmonary:     Effort: Pulmonary effort is normal. No respiratory distress.  Abdominal:     General: There is no distension.     Tenderness: There is no abdominal tenderness.  Skin:    General: Skin is warm and dry.  Neurological:     General: No focal deficit present.     Mental Status: He is  alert. Mental status is at baseline.     GCS: GCS eye subscore is 4. GCS verbal subscore is 5. GCS motor subscore is 6.     Cranial Nerves: No cranial nerve deficit or facial asymmetry.     Sensory: No sensory deficit.  Psychiatric:        Mood and Affect: Mood normal.        Behavior: Behavior normal.    ED Results / Procedures / Treatments   Labs (all labs ordered are listed, but only abnormal results are displayed) Labs Reviewed  BASIC METABOLIC PANEL  CBC WITH DIFFERENTIAL/PLATELET    EKG None  Radiology CT Head Wo Contrast  Result Date: 05/15/2021 CLINICAL DATA:  Headache, chronic, new features or increased frequency posterior occipital headache, evaluate for mass EXAM: CT HEAD WITHOUT CONTRAST TECHNIQUE: Contiguous axial images were obtained from the base of the skull through  the vertex without intravenous contrast. RADIATION DOSE REDUCTION: This exam was performed according to the departmental dose-optimization program which includes automated exposure control, adjustment of the mA and/or kV according to patient size and/or use of iterative reconstruction technique. COMPARISON:  None. FINDINGS: Brain: Normal brain volume for age. No intracranial hemorrhage, mass effect, or midline shift. No hydrocephalus. The basilar cisterns are patent. No evidence of territorial infarct or acute ischemia. No extra-axial or intracranial fluid collection. Vascular: No hyperdense vessel or unexpected calcification. Skull: No fracture or focal lesion. Sinuses/Orbits: Paranasal sinuses and mastoid air cells are clear. No mastoid effusion. The visualized orbits are unremarkable. Other: None. IMPRESSION: No acute intracranial abnormality or explanation for symptoms. No evidence of intracranial mass. Electronically Signed   By: Keith Rake M.D.   On: 05/15/2021 21:32   CT Angio Neck W and/or Wo Contrast  Result Date: 05/15/2021 CLINICAL DATA:  Vertebral artery dissection suspected, left-sided posterior neck pain EXAM: CT ANGIOGRAPHY NECK TECHNIQUE: Multidetector CT imaging of the neck was performed using the standard protocol during bolus administration of intravenous contrast. Multiplanar CT image reconstructions and MIPs were obtained to evaluate the vascular anatomy. Carotid stenosis measurements (when applicable) are obtained utilizing NASCET criteria, using the distal internal carotid diameter as the denominator. RADIATION DOSE REDUCTION: This exam was performed according to the departmental dose-optimization program which includes automated exposure control, adjustment of the mA and/or kV according to patient size and/or use of iterative reconstruction technique. CONTRAST:  82mL OMNIPAQUE IOHEXOL 350 MG/ML SOLN COMPARISON:  None. FINDINGS: Aortic arch: Standard branching. Imaged portion shows  no evidence of aneurysm or dissection. No significant stenosis of the major arch vessel origins. Aortic atherosclerosis. Right carotid system: No evidence of dissection, stenosis (50% or greater) or occlusion. Left carotid system: No evidence of dissection, stenosis (50% or greater) or occlusion. Vertebral arteries: No evidence of dissection, stenosis (50% or greater) or occlusion. Skeleton: Degenerative changes in the cervical spine, with severe disc height loss and disc osteophyte formation C5-T1, reversal of the normal cervical lordosis, and 3 mm anterolisthesis of C2 on C3 Other neck: Periapical lucency and fracture of the root of a right maxillary molar (series 11, image 109 and series 6, image 153). Upper chest: Centrilobular and paraseptal emphysema. No focal pulmonary opacity or pleural effusion. IMPRESSION: 1. No evidence of dissection, stenosis, or occlusion in the arteries of the neck. 2. Degenerative changes in the cervical spine, with severe disc height loss and disc osteophyte formation, reversal of the normal cervical lordosis, and mild anterolisthesis of C2 on C3. Consider nonemergent MRI for further evaluation. 3. Periapical lucency  about the roots of a right maxillary molar, which appears fractured. Consider dental consultation. 4. Aortic Atherosclerosis (ICD10-I70.0) and Emphysema (ICD10-J43.9). Electronically Signed   By: Merilyn Baba M.D.   On: 05/15/2021 21:39    Procedures Procedures    Medications Ordered in ED Medications  ketorolac (TORADOL) 30 MG/ML injection 30 mg (30 mg Intravenous Given 05/15/21 1956)  iohexol (OMNIPAQUE) 350 MG/ML injection 80 mL (80 mLs Intravenous Contrast Given 05/15/21 2109)    ED Course/ Medical Decision Making/ A&P Clinical Course as of 05/16/21 0912  Tue May 15, 2021  2212 CT is reviewed and largely unremarkable.  Patient symptoms somewhat improved with the Toradol.  I recommend he continue ibuprofen at home, we can try Flexeril as a muscle relaxer  as well, and he can follow-up with the [facility] hospital.  He verbalized understand [MT]    Clinical Course User Index [MT] Chayton Murata, Carola Rhine, MD                           Medical Decision Making Amount and/or Complexity of Data Reviewed Labs: ordered. Radiology: ordered.  Risk Prescription drug management.   This patient presents to the Emergency Department with complaint of headache.  This involves an extensive number of treatment options, and is a complaint that carries with it a high risk of complications and morbidity.  The differential diagnosis for headache includes tension type headache vs occipital headache vs migraine vs sinusitis vs intracranial bleed vs posterior circulation injury in the neck versus other  I ordered, reviewed, and interpreted labs, including BMP and CBC.  There were no immediate, life-threatening emergencies found in this labwork.   I ordered medication Toradol for headache and/or nausea I ordered imaging studies which included CT head, CT angio of the neck I independently visualized and interpreted imaging which showed no acute findings  After the interventions stated above, I reevaluated the patient and found that the patient remained clinically stable, with his headache improved.  Based on the patient's clinical exam, vital signs, risk factors, and ED testing, I felt that the patient's overall risk of life-threatening emergency such as ICH, meningitis, intracranial mass or tumor was quite low.  I suspect this clinical presentation is most consistent with cervical/occipital headache, but explained to the patient that this evaluation was not a definitive diagnostic workup.  I discussed outpatient follow up with primary care provider, and provided specialist office number on the patient's discharge paper if a referral was deemed necessary.  I discussed return precautions with the patient. I felt the patient was clinically stable for discharge.         Final  Clinical Impression(s) / ED Diagnoses Final diagnoses:  Nonintractable headache, unspecified chronicity pattern, unspecified headache type    Rx / DC Orders ED Discharge Orders          Ordered    cyclobenzaprine (FLEXERIL) 10 MG tablet  2 times daily PRN        05/15/21 2213              Wyvonnia Dusky, MD 05/16/21 [phone]

## 2021-05-15 NOTE — Telephone Encounter (Signed)
Called and spoke with Patient.  Beth,NP results and recommendations given. Understanding stated. Patient stated he is having pain behind his left ear that is sharp. Pain goes around his left earlobe, down the left side of his neck, around his jaw.  Patient states it's a sharp, shooting pain.  Patient stated ibuprofen helps some, but as soon as ibuprofen wears off, pain returns. Patient is concerned it may be heart related.  Message routed to [place], NP to advise

## 2021-05-15 NOTE — Telephone Encounter (Signed)
Please let patient know exercise stress test was normal. Exercise capacity was mildly impaired. BP was normal. No EGC changes were noted. Negative for ischemia.

## 2021-05-15 NOTE — ED Triage Notes (Addendum)
Pt c/o intermittent "achy, throbbing, sharp pain" that is very localized to and area behind his L and R ear. Pt states it has gone on for several months, but the L side has become bad in the last 2-3 days. Pt states certain movements like when he hunches his shoulder that makes the pain worse.

## 2021-05-15 NOTE — Telephone Encounter (Signed)
Advise she go to [facility] or ED for evaluation. Wait time is likely less at [facility] or [facility]

## 2021-05-29 ENCOUNTER — Encounter: Payer: Self-pay | Admitting: Nurse Practitioner

## 2021-05-29 ENCOUNTER — Ambulatory Visit (INDEPENDENT_AMBULATORY_CARE_PROVIDER_SITE_OTHER): Payer: No Typology Code available for payment source | Admitting: Nurse Practitioner

## 2021-05-29 ENCOUNTER — Other Ambulatory Visit: Payer: Self-pay

## 2021-05-29 VITALS — BP 137/74 | HR 60 | Temp 97.7°F | Ht 68.0 in | Wt 227.0 lb

## 2021-05-29 DIAGNOSIS — Z7689 Persons encountering health services in other specified circumstances: Secondary | ICD-10-CM

## 2021-05-29 DIAGNOSIS — M25512 Pain in left shoulder: Secondary | ICD-10-CM | POA: Insufficient documentation

## 2021-05-29 DIAGNOSIS — B029 Zoster without complications: Secondary | ICD-10-CM

## 2021-05-29 MED ORDER — VALACYCLOVIR HCL 1 G PO TABS: 1000.0000 mg | ORAL_TABLET | Freq: Two times a day (BID) | ORAL | 0 refills | Status: DC

## 2021-05-29 MED ORDER — PREDNISONE 10 MG (48) PO TBPK: ORAL_TABLET | ORAL | 0 refills | Status: DC

## 2021-05-29 NOTE — Progress Notes (Signed)
New Patient Office Visit  Subjective:  Patient ID: Arthur Harper, male    DOB: [birth year]  Age: 76 y.o. MRN: [id number]  CC:  Chief Complaint  Patient presents with   New Patient (Initial Visit)    HPI MATT DELPIZZO presents to establish a new primary care provider. He is currently a patient at [facility]. He will continue to see the [facility] system for management of chronic illness.  -having pain of left side of shoulder and neck. Feels like stinging with rash present. He also had pain behind the ears on both sides. This is a throbbing type pain. He was seen in the ER for this 05/15/2021. CT of his head was norma. A CT of the neck did show degenerative changes of the cervical spine and a fractured molar on the right side. This does not explain the symptoms that patient is having.  -blistered rash present on left upper chest, left shoulder, left side of neck, and left upper back. Rash is described as itchy and stings.    Past Medical History:  Diagnosis Date   Agent orange exposure    COPD (chronic obstructive pulmonary disease) (HCC)    PTSD (post-traumatic stress disorder)    Sleep apnea     Past Surgical History:  Procedure Laterality Date   fx ribs     TONSILLECTOMY      Family History  Problem Relation Age of Onset   Emphysema Mother    Emphysema Father    Colon cancer Neg Hx     Social History   Socioeconomic History   Marital status: Divorced    Spouse name: Not on file   Number of children: Not on file   Years of education: Not on file   Highest education level: Not on file  Occupational History   Occupation: retired    Comment: Primary school teacher  Tobacco Use   Smoking status: Former    Packs/day: 1.00    Years: 40.00    Pack years: 40.00    Types: Cigarettes    Quit date: 12/31/2008    Years since quitting: 12.4   Smokeless tobacco: Never   Tobacco comments:    10 cigs per day  Vaping Use   Vaping Use: Never used  Substance and Sexual  Activity   Alcohol use: Yes    Alcohol/week: 3.0 - 4.0 standard drinks    Types: 3 - 4 Cans of beer per week    Comment: occassional, 3 beers or glasses of wine per week   Drug use: No   Sexual activity: Yes  Other Topics Concern   Not on file  Social History Narrative   Not on file   Social Determinants of Health   Financial Resource Strain: Not on file  Food Insecurity: Not on file  Transportation Needs: Not on file  Physical Activity: Not on file  Stress: Not on file  Social Connections: Not on file  Intimate Partner Violence: Not on file    ROS Review of Systems  Constitutional:  Negative for activity change, chills, fatigue and fever.  HENT:  Positive for ear pain. Negative for congestion, postnasal drip, rhinorrhea, sinus pressure, sinus pain, sneezing and sore throat.   Eyes: Negative.   Respiratory:  Negative for cough, shortness of breath and wheezing.   Cardiovascular:  Negative for chest pain and palpitations.  Gastrointestinal:  Negative for constipation, diarrhea, nausea and vomiting.  Endocrine: Negative for cold intolerance, heat intolerance, polydipsia and polyuria.  Genitourinary:  Negative for dysuria, frequency and urgency.  Musculoskeletal:  Positive for arthralgias and myalgias. Negative for back pain.       Left shoulder and neck pain. Feels like burning or stinging type pain.   Skin:  Positive for rash.       Left upper check, left upper shoulder, left side of neck, and left upper back.   Allergic/Immunologic: Negative for environmental allergies.  Neurological:  Negative for dizziness, weakness and headaches.  Psychiatric/Behavioral:  The patient is not nervous/anxious.    Objective:   Today's Vitals: BP 137/74    Pulse 60    Temp 97.7 F (36.5 C)    Ht 5\' 8"  (1.727 m)    Wt 227 lb (103 kg)    SpO2 96%    BMI 34.52 kg/m   Physical Exam Vitals and nursing note reviewed.  Constitutional:      Appearance: Normal appearance. He is well-developed.   HENT:     Head: Normocephalic and atraumatic.     Nose: Nose normal.     Mouth/Throat:     Mouth: Mucous membranes are moist.     Pharynx: Oropharynx is clear.  Eyes:     Extraocular Movements: Extraocular movements intact.     Conjunctiva/sclera: Conjunctivae normal.     Pupils: Pupils are equal, round, and reactive to light.  Neck:   Cardiovascular:     Rate and Rhythm: Normal rate and regular rhythm.     Pulses: Normal pulses.     Heart sounds: Normal heart sounds.  Pulmonary:     Effort: Pulmonary effort is normal.     Breath sounds: Normal breath sounds.  Abdominal:     Palpations: Abdomen is soft.  Musculoskeletal:        General: Normal range of motion.     Cervical back: Normal range of motion and neck supple. Erythema present. Pain with movement present.     Comments: Tenderness with light palpation of superior aspect of the shoulder and left side of neck. No bony abnormalities or deformities are noted. ROM and strength are slightly diminished due to this pain .  Lymphadenopathy:     Cervical: Cervical adenopathy present.  Skin:    General: Skin is warm and dry.     Capillary Refill: Capillary refill takes less than 2 seconds.  Neurological:     General: No focal deficit present.     Mental Status: He is alert and oriented to person, place, and time.  Psychiatric:        Mood and Affect: Mood normal.        Behavior: Behavior normal.        Thought Content: Thought content normal.        Judgment: Judgment normal.    Assessment & Plan:  1. Encounter to establish care Appointment today to establish new primary care provider. [facility], however, patient would like to have community provider available for acute problems and concerns.   2. Herpes zoster without complication Likely shingles outbreak on left shoulder, left neck, left upper chest, and left upper back. Start valacyclovir 1000mg  twice daily for 10 days.  Add prednisone taper. Take as directed for 12 days. Continue to use previously prescribed creams to relieve pain and inflammation,.   - valACYclovir (VALTREX) 1000 MG tablet; Take 1 tablet (1,000 mg total) by mouth 2 (two) times daily.  Dispense: 20 tablet; Refill: 0 - predniSONE (STERAPRED UNI-PAK 48  TAB) 10 MG (48) TBPK tablet; 12 day taper - take by mouth as directed for 12 days  Dispense: 48 tablet; Refill: 0  3. Acute pain of left shoulder Pain is likely due to shingles outbreak. Will treat for shingles then reassess shoulder in two weeks. Consider x-rays and referral to orthopedics as indicated.     Problem List Items Addressed This Visit       Other   Herpes zoster without complication   Relevant Medications   ketoconazole (NIZORAL) 2 % cream   valACYclovir (VALTREX) 1000 MG tablet   predniSONE (STERAPRED UNI-PAK 48 TAB) 10 MG (48) TBPK tablet   Acute pain of left shoulder   Other Visit Diagnoses     Encounter to establish care    -  Primary       Outpatient Encounter Medications as of 05/29/2021  Medication Sig   albuterol (VENTOLIN HFA) 108 (90 Base) MCG/ACT inhaler Inhale 2 puffs into the lungs every 6 (six) hours as needed.   aspirin 81 MG chewable tablet Chew 81 mg by mouth daily.   budesonide-formoterol (SYMBICORT) 160-4.5 MCG/ACT inhaler Take 2 puffs first thing in am and then another 2 puffs about 12 hours later.   carboxymethylcellulose (REFRESH PLUS) 0.5 % SOLN INSTILL 1 DROP IN BOTH EYES FOUR TIMES A DAY AS NEEDED   cyclobenzaprine (FLEXERIL) 10 MG tablet Take 1 tablet (10 mg total) by mouth 2 (two) times daily as needed for up to 20 doses for muscle spasms.   fluticasone-salmeterol (ADVAIR) 250-50 MCG/ACT AEPB INHALE 1 INHALATION BY MOUTH TWICE A DAY (RINSE MOUTH WELL WITH WATER AFTER EACH USE)   hydrocortisone 2.5 % cream APPLY SMALL AMOUNT TO AFFECTED AREA TWICE A DAY AS NEEDED -MIX WITH KETOCONAZOLE CREAM AND APPLY TO THE RED AREAS UNDER ARMPITS AND  IN THE GROIN  TWICE DAILY AS NEEDED -MIX WITH KETOCONAZOLE CREAM AND APPLY TO THE RED AREAS UNDER ARMPITS AND   IN THE GROIN TWICE DAILY AS NEEDED   ketoconazole (NIZORAL) 2 % cream APPLY SMALL AMOUNT TO AFFECTED AREA TWICE A DAY -MIX WITH HYDROCORTISONE CREAM AND APPLY TO THE RED AREAS IN THE GROIN AND  ARMPIT TWICE DAILY AS NEEDED -MIX WITH HYDROCORTISONE CREAM AND APPLY TO THE RED AREAS IN THE GROIN AND  ARMPIT TWICE DAILY AS NEEDED   montelukast (SINGULAIR) 10 MG tablet Take 1 tablet (10 mg total) by mouth at bedtime.   Multiple Vitamin (MULTIVITAMIN) capsule Take 1 capsule by mouth daily.   predniSONE (STERAPRED UNI-PAK 48 TAB) 10 MG (48) TBPK tablet 12 day taper - take by mouth as directed for 12 days   terazosin (HYTRIN) 5 MG capsule Take 5 mg by mouth at bedtime.   Tiotropium Bromide Monohydrate (SPIRIVA RESPIMAT) 2.5 MCG/ACT AERS Inhale 2 puffs into the lungs daily.   valACYclovir (VALTREX) 1000 MG tablet Take 1 tablet (1,000 mg total) by mouth 2 (two) times daily.   No facility-administered encounter medications on file as of 05/29/2021.    Follow-up: Return in about 2 weeks (around 06/12/2021) for treatment of shingles .   Ronnell Freshwater, NP

## 2021-06-12 ENCOUNTER — Other Ambulatory Visit: Payer: Self-pay

## 2021-06-12 ENCOUNTER — Ambulatory Visit (INDEPENDENT_AMBULATORY_CARE_PROVIDER_SITE_OTHER): Payer: No Typology Code available for payment source | Admitting: Nurse Practitioner

## 2021-06-12 ENCOUNTER — Encounter: Payer: Self-pay | Admitting: Nurse Practitioner

## 2021-06-12 VITALS — BP 115/69 | HR 69 | Temp 98.0°F | Ht 68.0 in | Wt 224.4 lb

## 2021-06-12 DIAGNOSIS — Z6834 Body mass index (BMI) 34.0-34.9, adult: Secondary | ICD-10-CM | POA: Diagnosis not present

## 2021-06-12 DIAGNOSIS — B029 Zoster without complications: Secondary | ICD-10-CM | POA: Diagnosis not present

## 2021-06-12 DIAGNOSIS — M542 Cervicalgia: Secondary | ICD-10-CM | POA: Diagnosis not present

## 2021-06-12 NOTE — Progress Notes (Signed)
Established patient visit   Patient: Arthur Harper   DOB: 06-10-1945   76 y.o. Male  MRN: [id number] Visit Date: 06/12/2021   Chief Complaint  Patient presents with   Follow-up   Subjective    HPI  The patient is here for routine follow up. He was treated for shingles at his initial visit. Rash was on left side of his neck and shoulder. Was treated wit antiviral. Is dong much better now. Rash has cleared. Has some residual neck and shoulder pain which is improved since he was last seen.  -cramping and stiffness in his fingers. Has some charlie horses in his legs from time to time.    Medications: Outpatient Medications Prior to Visit  Medication Sig   albuterol (VENTOLIN HFA) 108 (90 Base) MCG/ACT inhaler Inhale 2 puffs into the lungs every 6 (six) hours as needed.   aspirin 81 MG chewable tablet Chew 81 mg by mouth daily.   budesonide-formoterol (SYMBICORT) 160-4.5 MCG/ACT inhaler Take 2 puffs first thing in am and then another 2 puffs about 12 hours later.   carboxymethylcellulose (REFRESH PLUS) 0.5 % SOLN INSTILL 1 DROP IN BOTH EYES FOUR TIMES A DAY AS NEEDED   cyclobenzaprine (FLEXERIL) 10 MG tablet Take 1 tablet (10 mg total) by mouth 2 (two) times daily as needed for up to 20 doses for muscle spasms.   fluticasone-salmeterol (ADVAIR) 250-50 MCG/ACT AEPB INHALE 1 INHALATION BY MOUTH TWICE A DAY (RINSE MOUTH WELL WITH WATER AFTER EACH USE)   hydrocortisone 2.5 % cream APPLY SMALL AMOUNT TO AFFECTED AREA TWICE A DAY AS NEEDED -MIX WITH KETOCONAZOLE CREAM AND APPLY TO THE RED AREAS UNDER ARMPITS AND  IN THE GROIN TWICE DAILY AS NEEDED -MIX WITH KETOCONAZOLE CREAM AND APPLY TO THE RED AREAS UNDER ARMPITS AND   IN THE GROIN TWICE DAILY AS NEEDED   ketoconazole (NIZORAL) 2 % cream APPLY SMALL AMOUNT TO AFFECTED AREA TWICE A DAY -MIX WITH HYDROCORTISONE CREAM AND APPLY TO THE RED AREAS IN THE GROIN AND  ARMPIT TWICE DAILY AS NEEDED -MIX WITH HYDROCORTISONE CREAM AND APPLY TO THE RED  AREAS IN THE GROIN AND  ARMPIT TWICE DAILY AS NEEDED   montelukast (SINGULAIR) 10 MG tablet Take 1 tablet (10 mg total) by mouth at bedtime.   Multiple Vitamin (MULTIVITAMIN) capsule Take 1 capsule by mouth daily.   predniSONE (STERAPRED UNI-PAK 48 TAB) 10 MG (48) TBPK tablet 12 day taper - take by mouth as directed for 12 days   terazosin (HYTRIN) 5 MG capsule Take 5 mg by mouth at bedtime.   Tiotropium Bromide Monohydrate (SPIRIVA RESPIMAT) 2.5 MCG/ACT AERS Inhale 2 puffs into the lungs daily.   valACYclovir (VALTREX) 1000 MG tablet Take 1 tablet (1,000 mg total) by mouth 2 (two) times daily.   No facility-administered medications prior to visit.    Review of Systems  Constitutional:  Negative for activity change, chills, fatigue and fever.  HENT:  Negative for congestion, ear pain, postnasal drip, rhinorrhea, sinus pressure, sinus pain, sneezing and sore throat.   Eyes: Negative.   Respiratory:  Negative for cough, shortness of breath and wheezing.   Cardiovascular:  Negative for chest pain and palpitations.  Gastrointestinal:  Negative for constipation, diarrhea, nausea and vomiting.  Endocrine: Negative for cold intolerance, heat intolerance, polydipsia and polyuria.  Genitourinary:  Negative for dysuria, frequency and urgency.  Musculoskeletal:  Positive for arthralgias and myalgias. Negative for back pain.       Left shoulder and neck pain.  Feels like burning or stinging type pain.  This is improved but not resolved since his previous visit.  Skin:  Positive for rash.       Nearly resolved.  Allergic/Immunologic: Negative for environmental allergies.  Neurological:  Negative for dizziness, weakness and headaches.  Psychiatric/Behavioral:  The patient is not nervous/anxious.      Objective     Today's Vitals   06/12/21 1000  BP: 115/69  Pulse: 69  Temp: 98 F (36.7 C)  SpO2: 95%  Weight: 224 lb 6.4 oz (101.8 kg)  Height: 5\' 8"  (1.727 m)   Body mass index is 34.12  kg/m.   BP Readings from Last 3 Encounters:  06/12/21 115/69  05/29/21 137/74  05/15/21 (!) 142/100    Wt Readings from Last 3 Encounters:  06/12/21 224 lb 6.4 oz (101.8 kg)  05/29/21 227 lb (103 kg)  05/15/21 217 lb (98.4 kg)    Physical Exam Vitals and nursing note reviewed.  Constitutional:      Appearance: Normal appearance. He is well-developed.  HENT:     Head: Normocephalic and atraumatic.     Nose: Nose normal.     Mouth/Throat:     Mouth: Mucous membranes are moist.     Pharynx: Oropharynx is clear.  Eyes:     Extraocular Movements: Extraocular movements intact.     Conjunctiva/sclera: Conjunctivae normal.     Pupils: Pupils are equal, round, and reactive to light.  Cardiovascular:     Rate and Rhythm: Normal rate and regular rhythm.     Pulses: Normal pulses.     Heart sounds: Normal heart sounds.  Pulmonary:     Effort: Pulmonary effort is normal.     Breath sounds: Normal breath sounds.  Abdominal:     Palpations: Abdomen is soft.  Musculoskeletal:     Cervical back: Neck supple. Pain with movement, spinous process tenderness and muscular tenderness present. Decreased range of motion.  Lymphadenopathy:     Cervical: No cervical adenopathy.  Skin:    General: Skin is warm and dry.     Capillary Refill: Capillary refill takes less than 2 seconds.  Neurological:     General: No focal deficit present.     Mental Status: He is alert and oriented to person, place, and time.  Psychiatric:        Mood and Affect: Mood normal.        Behavior: Behavior normal.        Thought Content: Thought content normal.        Judgment: Judgment normal.      Assessment & Plan    1. Herpes zoster without complication Resolved.  2. Neck pain Slight tenderness of the left side of neck and left shoulder after having shingles infection to these areas.  Has improved.  Patient to contact the office if symptoms worsen or do not improve in the next 2 to 3 weeks.  Patient and  his wife voiced understanding and agreement.  3. Body mass index (BMI) of 34.0-34.9 in adult Encourage patient to limit calorie intake to 2000 cal/day or less.  He should consume a low cholesterol, low-fat diet.    Problem List Items Addressed This Visit       Other   Herpes zoster without complication - Primary   Neck pain   Body mass index (BMI) of 34.0-34.9 in adult     Return in about 3 months (around 09/09/2021) for medicare wellness, FBW a week prior to visit  with PSA.         Ronnell Freshwater, NP  [facility] [phone] (phone) [phone] (fax)  [place]

## 2021-06-17 DIAGNOSIS — Z6834 Body mass index (BMI) 34.0-34.9, adult: Secondary | ICD-10-CM | POA: Insufficient documentation

## 2021-06-17 DIAGNOSIS — M542 Cervicalgia: Secondary | ICD-10-CM | POA: Insufficient documentation

## 2021-06-17 DIAGNOSIS — Z6833 Body mass index (BMI) 33.0-33.9, adult: Secondary | ICD-10-CM | POA: Insufficient documentation

## 2021-06-17 NOTE — Patient Instructions (Signed)
Fat and Cholesterol Restricted Eating Plan Getting too much fat and cholesterol in your diet may cause health problems. Choosing the right foods helps keep your fat and cholesterol at normal levels. This can keep you from getting certain diseases. Your doctor may recommend an eating plan that includes: Total fat: ______% or less of total calories a day. This is ______g of fat a day. Saturated fat: ______% or less of total calories a day. This is ______g of saturated fat a day. Cholesterol: less than _________mg a day. Fiber: ______g a day. What are tips for following this plan? General tips Work with your doctor to lose weight if you need to. Avoid: Foods with added sugar. Fried foods. Foods with trans fat or partially hydrogenated oils. This includes some margarines and baked goods. If you drink alcohol: Limit how much you have to: 0-1 drink a day for women who are not pregnant. 0-2 drinks a day for men. Know how much alcohol is in a drink. In the [place] one drink equals one 12 oz bottle of beer (355 mL), one 5 oz glass of wine (148 mL), or one 1 oz glass of hard liquor (44 mL). Reading food labels Check food labels for: Trans fats. Partially hydrogenated oils. Saturated fat (g) in each serving. Cholesterol (mg) in each serving. Fiber (g) in each serving. Choose foods with healthy fats, such as: Monounsaturated fats and polyunsaturated fats. These include olive and canola oil, flaxseeds, walnuts, almonds, and seeds. Omega-3 fats. These are found in certain fish, flaxseed oil, and ground flaxseeds. Choose grain products that have whole grains. Look for the word "whole" as the first word in the ingredient list. Cooking Cook foods using low-fat methods. These include baking, boiling, grilling, and broiling. Eat more home-cooked foods. Eat at restaurants and buffets less often. Eat less fast food. Avoid cooking using saturated fats, such as butter, cream, palm oil, palm kernel oil, and  coconut oil. Meal planning  At meals, divide your plate into four equal parts: Fill one-half of your plate with vegetables, green salads, and fruit. Fill one-fourth of your plate with whole grains. Fill one-fourth of your plate with low-fat (lean) protein foods. Eat fish that is high in omega-3 fats at least two times a week. This includes mackerel, tuna, sardines, and salmon. Eat foods that are high in fiber, such as whole grains, beans, apples, pears, berries, broccoli, carrots, peas, and barley. What foods should I eat? Fruits All fresh, canned (in natural juice), or frozen fruits. Vegetables Fresh or frozen vegetables (raw, steamed, roasted, or grilled). Green salads. Grains Whole grains, such as whole wheat or whole grain breads, crackers, cereals, and pasta. Unsweetened oatmeal, bulgur, barley, quinoa, or brown rice. Corn or whole wheat flour tortillas. Meats and other protein foods Ground beef (85% or leaner), grass-fed beef, or beef trimmed of fat. Skinless chicken or turkey. Ground chicken or turkey. Pork trimmed of fat. All fish and seafood. Egg whites. Dried beans, peas, or lentils. Unsalted nuts or seeds. Unsalted canned beans. Nut butters without added sugar or oil. Dairy Low-fat or nonfat dairy products, such as skim or 1% milk, 2% or reduced-fat cheeses, low-fat and fat-free ricotta or cottage cheese, or plain low-fat and nonfat yogurt. Fats and oils Tub margarine without trans fats. Light or reduced-fat mayonnaise and salad dressings. Avocado. Olive, canola, sesame, or safflower oils. The items listed above may not be a complete list of foods and beverages you can eat. Contact a dietitian for more information. What foods   should I avoid? Fruits Canned fruit in heavy syrup. Fruit in cream or butter sauce. Fried fruit. Vegetables Vegetables cooked in cheese, cream, or butter sauce. Fried vegetables. Grains White bread. White pasta. White rice. Cornbread. Bagels, pastries,  and croissants. Crackers and snack foods that contain trans fat and hydrogenated oils. Meats and other protein foods Fatty cuts of meat. Ribs, chicken wings, bacon, sausage, bologna, salami, chitterlings, fatback, hot dogs, bratwurst, and packaged lunch meats. Liver and organ meats. Whole eggs and egg yolks. Chicken and turkey with skin. Fried meat. Dairy Whole or 2% milk, cream, half-and-half, and cream cheese. Whole milk cheeses. Whole-fat or sweetened yogurt. Full-fat cheeses. Nondairy creamers and whipped toppings. Processed cheese, cheese spreads, and cheese curds. Fats and oils Butter, stick margarine, lard, shortening, ghee, or bacon fat. Coconut, palm kernel, and palm oils. Beverages Alcohol. Sugar-sweetened drinks such as sodas, lemonade, and fruit drinks. Sweets and desserts Corn syrup, sugars, honey, and molasses. Candy. Jam and jelly. Syrup. Sweetened cereals. Cookies, pies, cakes, donuts, muffins, and ice cream. The items listed above may not be a complete list of foods and beverages you should avoid. Contact a dietitian for more information. Summary Choosing the right foods helps keep your fat and cholesterol at normal levels. This can keep you from getting certain diseases. At meals, fill one-half of your plate with vegetables, green salads, and fruits. Eat high fiber foods, like whole grains, beans, apples, pears, berries, carrots, peas, and barley. Limit added sugar, saturated fats, alcohol, and fried foods. This information is not intended to replace advice given to you by your health care provider. Make sure you discuss any questions you have with your health care provider. Document Revised: 08/18/2020 Document Reviewed: 08/18/2020 Elsevier Patient Education  2022 [employer]  

## 2021-06-20 ENCOUNTER — Encounter: Payer: Self-pay | Admitting: Internal Medicine

## 2021-06-20 ENCOUNTER — Ambulatory Visit (INDEPENDENT_AMBULATORY_CARE_PROVIDER_SITE_OTHER): Payer: No Typology Code available for payment source | Admitting: Internal Medicine

## 2021-06-20 ENCOUNTER — Other Ambulatory Visit: Payer: Self-pay

## 2021-06-20 DIAGNOSIS — J449 Chronic obstructive pulmonary disease, unspecified: Secondary | ICD-10-CM | POA: Diagnosis not present

## 2021-06-20 NOTE — Progress Notes (Signed)
? ?Arthur Harper, male    DOB: 1945-11-13,    MRN: [id number] ? ? ?Brief patient profile:  ?70 yowm MM/quit smoking 2010 @ wt 180  referred to pulmonary clinic 02/08/2021 [facility] with GOLD 2 criteria in 05/2010.    ? ? ? ?History of Present Illness  ?02/08/2021  Pulmonary/ 1st office eval/[name]  ?Chief Complaint  ?Patient presents with  ? Consult  ?  Referred by Dr. Gwenette Greet from [facility]  ?Dyspnea:  [place] stops every 5-10 min no routine  ?Cough: tickle in throat for more than a year or two, min clear  mucus prod/ slt hoarse ?Sleep: cpap per Clance  ?SABA use: once or twice a month hfa/ no neb  ?Rec ?Plan A = Automatic = Always=   Symbicort  Take 2 puffs first thing in am and then another 2 puffs about 12 hours later and spiriva 2 puffs in am  ?Work on inhaler technique:   ? Plan B = Backup (to supplement plan A, not to replace it) ?Only use your albuterol inhaler as a rescue medication  ?Ok to try albuterol 15 min before an activity (on alternating days)  that you know would usually make you short of breath  ?    ?12/1 22  NP added singulair  ?  ? ?06/20/2021  f/u ov/[name] re: GOLD 2 COPD  maint on symbicort spiriva singulair   ?Chief Complaint  ?Patient presents with  ? Follow-up  ?  Follow up. Patient says he's having trouble with shortness of breath.   ?Dyspnea:  walking neighborhood ?Cough: not in am/ tends be in evening p supper ?Sleeping: bed is flat / cpap ?SABA use: once a week ?02: none  ?Covid status:   none / infected  ? ? ?No obvious day to day or daytime variability or assoc excess/ purulent sputum or mucus plugs or hemoptysis or cp or chest tightness, subjective wheeze or overt sinus or hb symptoms.  ? ?Sleeping  without nocturnal  or early am exacerbation  of respiratory  c/o's or need for noct saba. Also denies any obvious fluctuation of symptoms with weather or environmental changes or other aggravating or alleviating factors except as outlined  above  ? ?No unusual exposure hx or h/o childhood pna/ asthma or knowledge of premature birth. ? ?Current Allergies, Complete Past Medical History, Past Surgical History, Family History, and Social History were reviewed in [employer] record. ? ?ROS  The following are not active complaints unless bolded ?Hoarseness, sore throat, dysphagia, dental problems, itching, sneezing,  nasal congestion or discharge of excess mucus or purulent secretions, ear ache,   fever, chills, sweats, unintended wt loss or wt gain, classically pleuritic or exertional cp,  orthopnea pnd or arm/hand swelling  or leg swelling, presyncope, palpitations, abdominal pain, anorexia, nausea, vomiting, diarrhea  or change in bowel habits or change in bladder habits, change in stools or change in urine, dysuria, hematuria,  rash, arthralgias, visual complaints, headache, numbness, weakness or ataxia or problems with walking or coordination,  change in mood or  memory. ?      ? ?Current Meds  ?Medication Sig  ? albuterol (VENTOLIN HFA) 108 (90 Base) MCG/ACT inhaler Inhale 2 puffs into the lungs every 6 (six) hours as needed.  ? aspirin 81 MG chewable tablet Chew 81 mg by mouth daily.  ? budesonide-formoterol (SYMBICORT) 160-4.5 MCG/ACT inhaler Take 2 puffs first thing in am and then  another 2 puffs about 12 hours later.  ? carboxymethylcellulose (REFRESH PLUS) 0.5 % SOLN INSTILL 1 DROP IN BOTH EYES FOUR TIMES A DAY AS NEEDED  ? cyclobenzaprine (FLEXERIL) 10 MG tablet Take 1 tablet (10 mg total) by mouth 2 (two) times daily as needed for up to 20 doses for muscle spasms.  ? fluticasone-salmeterol (ADVAIR) 250-50 MCG/ACT AEPB INHALE 1 INHALATION BY MOUTH TWICE A DAY (RINSE MOUTH WELL WITH WATER AFTER EACH USE)  ? hydrocortisone 2.5 % cream APPLY SMALL AMOUNT TO AFFECTED AREA TWICE A DAY AS NEEDED -MIX WITH KETOCONAZOLE CREAM AND APPLY TO THE RED AREAS UNDER ARMPITS AND  IN THE GROIN TWICE DAILY AS NEEDED -MIX WITH KETOCONAZOLE  CREAM AND APPLY TO THE RED AREAS UNDER ARMPITS AND   IN THE GROIN TWICE DAILY AS NEEDED  ? ketoconazole (NIZORAL) 2 % cream APPLY SMALL AMOUNT TO AFFECTED AREA TWICE A DAY -MIX WITH HYDROCORTISONE CREAM AND APPLY TO THE RED AREAS IN THE GROIN AND  ARMPIT TWICE DAILY AS NEEDED -MIX WITH HYDROCORTISONE CREAM AND APPLY TO THE RED AREAS IN THE GROIN AND  ARMPIT TWICE DAILY AS NEEDED  ? montelukast (SINGULAIR) 10 MG tablet Take 1 tablet (10 mg total) by mouth at bedtime.  ? Multiple Vitamin (MULTIVITAMIN) capsule Take 1 capsule by mouth daily.  ? predniSONE (STERAPRED UNI-PAK 48 TAB) 10 MG (48) TBPK tablet 12 day taper - take by mouth as directed for 12 days  ? terazosin (HYTRIN) 5 MG capsule Take 5 mg by mouth at bedtime.  ? Tiotropium Bromide Monohydrate (SPIRIVA RESPIMAT) 2.5 MCG/ACT AERS Inhale 2 puffs into the lungs daily.  ? valACYclovir (VALTREX) 1000 MG tablet Take 1 tablet (1,000 mg total) by mouth 2 (two) times daily.  ?    ? ?  ?   ? ? ? ?Past Medical History:  ?Diagnosis Date  ? Agent orange exposure   ? COPD (chronic obstructive pulmonary disease) ([place])   ? PTSD (post-traumatic stress disorder)   ? Sleep apnea   ? ? ?  ? ? ?Objective:  ?  ? ?  ?Wt Readings from Last 3 Encounters:  ?06/20/21 221 lb (100.2 kg)  ?06/12/21 224 lb 6.4 oz (101.8 kg)  ?05/29/21 227 lb (103 kg)  ?  ? ? ?Vital signs reviewed  06/20/2021  - Note at rest 02 sats  98% on RA  ? ?General appearance:    slt hoarse   ? ? ?HEENT : pt wearing mask not removed for exam due to covid - 19 concerns.  ? ?NECK :  without JVD/Nodes/TM/ nl carotid upstrokes bilaterally ? ? ?LUNGS: no acc muscle use,  Min barrel  contour chest wall with bilateral  slightly decreased bs s audible wheeze and  without cough on insp or exp maneuvers and min  Hyperresonant  to  percussion bilaterally   ? ? ?CV:  RRR  no s3 or murmur or increase in P2, and no edema  ? ?ABD:  soft and nontender with pos end  insp Hoover's  in the supine position. No bruits or organomegaly  appreciated, bowel sounds nl ? ?MS:   Nl gait/  ext warm without deformities, calf tenderness, cyanosis or clubbing ?No obvious joint restrictions  ? ?SKIN: warm and dry without lesions   ? ?NEURO:  alert, approp, nl sensorium with  no motor or cerebellar deficits apparent.  ?    ?  ? ?  ? ? ?  ? ?   ? ?   ?  Assessment  ? ?  ?  ? ?  ? ?  ?  ?

## 2021-06-20 NOTE — Patient Instructions (Addendum)
Ok to try albuterol 15 min before an activity (on alternating days)  that you know would usually make you short of breath and see if it makes any difference and if makes none then don't take albuterol after activity unless you can't catch your breath as this means it's the resting that helps, not the albuterol. ? ?Work on inhaler technique:  relax and gently blow all the way out then take a nice smooth full deep breath back in, triggering the inhaler at same time you start breathing in.  Hold for up to 5 seconds if you can. Blow out thru nose. Rinse and gargle with water when done.  If mouth or throat bother you at all,  try brushing teeth/gums/tongue with arm and hammer toothpaste/ make a slurry and gargle and spit out.  ? ? ?GERD (REFLUX)  is an extremely common cause of respiratory symptoms just like yours , many times with no obvious heartburn at all.  ? ? It can be treated with medication, but also with lifestyle changes including elevation of the head of your bed (ideally with 6 -8inch blocks under the headboard of your bed),  Smoking cessation, avoidance of late meals, excessive alcohol, and avoid fatty foods, chocolate, peppermint, colas, red wine, and acidic juices such as orange juice.  ?NO MINT OR MENTHOL PRODUCTS SO NO COUGH DROPS  ?USE SUGARLESS CANDY INSTEAD (Jolley ranchers or Stover's or Life Savers) or even ice chips will also do - the key is to swallow to prevent all throat clearing. ?NO OIL BASED VITAMINS - use powdered substitutes.  Avoid fish oil when coughing.  ? ?Try prilosec otc 20mg   Take 30-60 min before first meal of the day and Pepcid ac (famotidine) 20 mg one after supper  ? ?Ok to try off singulair  ?   ? ?Please schedule a follow up office visit in 6 weeks, call sooner if needed  ? ? ?   ?    ?

## 2021-06-20 NOTE — Assessment & Plan Note (Signed)
Quit smoking 2010/ MM ?- Spirometry 07/03/20  FEV1 1.94 (59%)  Ratio 0.50 p 30% improvement from saba   ?- 02/08/2021   Walked on RA x  3  lap(s) =  approx 750 @ nl pace, stopped due to end of study but sob p one lap with lowest 02 sats 95%  ?- 02/08/2021   Try symbicort/spiriva ?- Labs ordered 02/08/2021  :  allergy profile  Eos 0.2/ IgE not done/ alpha one AT phenotype MM level 167  ?- PFT's  03/22/21 FEV1 1.9 (72 % ) ratio 0.57  p 13 % improvement from saba p symbicort/spiriva prior to study with DLCO  Nl FV curve classic concavity  ? ? ?Continues with doe/ cough ? Etiology  ? ?DDX of  difficult airways management almost all start with A and  include Adherence, Ace Inhibitors, Acid Reflux, Active Sinus Disease, Alpha 1 Antitripsin deficiency, Anxiety masquerading as Airways dz,  ABPA,  Allergy(esp in young), Aspiration (esp in elderly), Adverse effects of meds,  Active smoking or vaping, A bunch of PE's (a small clot burden can't cause this syndrome unless there is already severe underlying pulm or vascular dz with poor reserve) plus two Bs  = Bronchiectasis and Beta blocker use..and one C= CHF ? ? ?Adherence is always the initial "prime suspect" and is a multilayered concern that requires a "trust but verify" approach in every patient - starting with knowing how to use medications, especially inhalers, correctly, keeping up with refills and understanding the fundamental difference between maintenance and prns vs those medications only taken for a very short course and then stopped and not refilled.  ?- see hfa teaching ? ?? Allergy > neg resp to singulair, leave off and reach screen at f/u ov ? ?? Acid (or non-acid) GERD > always difficult to exclude as up to 75% of pts in some series report no assoc GI/ Heartburn symptoms> rec max (24h)  acid suppression and diet restrictions/ reviewed and instructions given in writing.  ? ?? Anxiety/depression/deconditioning  > usually at the bottom of this list of usual  suspects but  may interfere with adherence and also interpretation of response or lack thereof to symptom management which can be quite subjective.  ? ?? Cardiac > neg GXT  05/09/21 reassuring  ? ? ?>>> f/u 6 weeks  ? ?    ?  ? ?Each maintenance medication was reviewed in detail including emphasizing most importantly the difference between maintenance and prns and under what circumstances the prns are to be triggered using an action plan format where appropriate. ? ?Total time for H and P, chart review, counseling, reviewing hfa/smi device(s) and generating customized AVS unique to this office visit / same day charting  > 35 min  ?     ?

## 2021-07-17 ENCOUNTER — Ambulatory Visit (INDEPENDENT_AMBULATORY_CARE_PROVIDER_SITE_OTHER): Payer: No Typology Code available for payment source | Admitting: Nurse Practitioner

## 2021-07-17 ENCOUNTER — Other Ambulatory Visit: Payer: Self-pay

## 2021-07-17 ENCOUNTER — Encounter: Payer: Self-pay | Admitting: Nurse Practitioner

## 2021-07-17 VITALS — BP 113/62 | HR 71 | Temp 97.8°F | Ht 68.0 in | Wt 222.4 lb

## 2021-07-17 DIAGNOSIS — F411 Generalized anxiety disorder: Secondary | ICD-10-CM

## 2021-07-17 DIAGNOSIS — F5105 Insomnia due to other mental disorder: Secondary | ICD-10-CM

## 2021-07-17 DIAGNOSIS — Z6833 Body mass index (BMI) 33.0-33.9, adult: Secondary | ICD-10-CM | POA: Diagnosis not present

## 2021-07-17 DIAGNOSIS — F409 Phobic anxiety disorder, unspecified: Secondary | ICD-10-CM | POA: Insufficient documentation

## 2021-07-17 MED ORDER — TRAZODONE HCL 50 MG PO TABS: 25.0000 mg | ORAL_TABLET | Freq: Every evening | ORAL | 1 refills | Status: DC | PRN

## 2021-07-17 MED ORDER — CITALOPRAM HYDROBROMIDE 10 MG PO TABS: 10.0000 mg | ORAL_TABLET | Freq: Every day | ORAL | 3 refills | Status: DC

## 2021-07-17 NOTE — Progress Notes (Signed)
Established patient visit ? ? ?Patient: Arthur Harper   DOB: 03/31/1946   76 y.o. Male  MRN: [id number] ?Visit Date: 07/17/2021 ? ? ?Chief Complaint  ?Patient presents with  ? Anxiety  ? ?Subjective  ?  ?HPI  ?The patient is here for acute visit. He is struggling with anxiety related to family stress. His daughter is struggling with drug addiction and homelessness. He is unable to take her in due to the drug problems and manipulations. He feels very guilty and sad.  He is unable to sleep. His ex wife, the mother of his two daughters, passed away suddenly about a month ago. This event caused the situation to become much more severe.  ? ? ?Medications: ?Outpatient Medications Prior to Visit  ?Medication Sig  ? albuterol (VENTOLIN HFA) 108 (90 Base) MCG/ACT inhaler Inhale 2 puffs into the lungs every 6 (six) hours as needed.  ? aspirin 81 MG chewable tablet Chew 81 mg by mouth daily.  ? budesonide-formoterol (SYMBICORT) 160-4.5 MCG/ACT inhaler Take 2 puffs first thing in am and then another 2 puffs about 12 hours later.  ? carboxymethylcellulose (REFRESH PLUS) 0.5 % SOLN INSTILL 1 DROP IN BOTH EYES FOUR TIMES A DAY AS NEEDED  ? cyclobenzaprine (FLEXERIL) 10 MG tablet Take 1 tablet (10 mg total) by mouth 2 (two) times daily as needed for up to 20 doses for muscle spasms.  ? hydrocortisone 2.5 % cream APPLY SMALL AMOUNT TO AFFECTED AREA TWICE A DAY AS NEEDED -MIX WITH KETOCONAZOLE CREAM AND APPLY TO THE RED AREAS UNDER ARMPITS AND  IN THE GROIN TWICE DAILY AS NEEDED -MIX WITH KETOCONAZOLE CREAM AND APPLY TO THE RED AREAS UNDER ARMPITS AND   IN THE GROIN TWICE DAILY AS NEEDED  ? ketoconazole (NIZORAL) 2 % cream APPLY SMALL AMOUNT TO AFFECTED AREA TWICE A DAY -MIX WITH HYDROCORTISONE CREAM AND APPLY TO THE RED AREAS IN THE GROIN AND  ARMPIT TWICE DAILY AS NEEDED -MIX WITH HYDROCORTISONE CREAM AND APPLY TO THE RED AREAS IN THE GROIN AND  ARMPIT TWICE DAILY AS NEEDED  ? Multiple Vitamin (MULTIVITAMIN) capsule Take 1  capsule by mouth daily.  ? predniSONE (STERAPRED UNI-PAK 48 TAB) 10 MG (48) TBPK tablet 12 day taper - take by mouth as directed for 12 days  ? terazosin (HYTRIN) 5 MG capsule Take 5 mg by mouth at bedtime.  ? Tiotropium Bromide Monohydrate (SPIRIVA RESPIMAT) 2.5 MCG/ACT AERS Inhale 2 puffs into the lungs daily.  ? valACYclovir (VALTREX) 1000 MG tablet Take 1 tablet (1,000 mg total) by mouth 2 (two) times daily.  ? ?No facility-administered medications prior to visit.  ? ? ?Review of Systems  ?Constitutional:  Positive for fatigue. Negative for activity change, chills and fever.  ?HENT:  Negative for congestion, postnasal drip, rhinorrhea, sinus pressure, sinus pain, sneezing and sore throat.   ?Eyes: Negative.   ?Respiratory:  Negative for cough, shortness of breath and wheezing.   ?Cardiovascular:  Negative for chest pain and palpitations.  ?Gastrointestinal:  Negative for constipation, diarrhea, nausea and vomiting.  ?Endocrine: Negative for cold intolerance, heat intolerance, polydipsia and polyuria.  ?Genitourinary:  Negative for dysuria, frequency and urgency.  ?Musculoskeletal:  Negative for back pain and myalgias.  ?Skin:  Negative for rash.  ?Allergic/Immunologic: Negative for environmental allergies.  ?Neurological:  Negative for dizziness, weakness and headaches.  ?Psychiatric/Behavioral:  Positive for dysphoric mood and sleep disturbance. The patient is nervous/anxious.   ? ? Objective  ?  ? ?Today's Vitals  ? 07/17/21  1046  ?BP: 113/62  ?Pulse: 71  ?Temp: 97.8 ?F (36.6 ?C)  ?SpO2: 95%  ?Weight: 222 lb 6.4 oz (100.9 kg)  ?Height: '5\' 8"'$  (1.727 m)  ? ?Body mass index is 33.82 kg/m?.  ? ?BP Readings from Last 3 Encounters:  ?07/17/21 113/62  ?06/20/21 106/68  ?06/12/21 115/69  ?  ?Wt Readings from Last 3 Encounters:  ?07/17/21 222 lb 6.4 oz (100.9 kg)  ?06/20/21 221 lb (100.2 kg)  ?06/12/21 224 lb 6.4 oz (101.8 kg)  ?  ?Physical Exam ?Vitals and nursing note reviewed.  ?Constitutional:   ?   Appearance:  Normal appearance. He is well-developed.  ?HENT:  ?   Head: Normocephalic and atraumatic.  ?Eyes:  ?   Pupils: Pupils are equal, round, and reactive to light.  ?Cardiovascular:  ?   Rate and Rhythm: Normal rate and regular rhythm.  ?   Pulses: Normal pulses.  ?   Heart sounds: Normal heart sounds.  ?Pulmonary:  ?   Effort: Pulmonary effort is normal.  ?   Breath sounds: Normal breath sounds.  ?Abdominal:  ?   Palpations: Abdomen is soft.  ?Musculoskeletal:     ?   General: Normal range of motion.  ?   Cervical back: Normal range of motion and neck supple.  ?Lymphadenopathy:  ?   Cervical: No cervical adenopathy.  ?Skin: ?   General: Skin is warm and dry.  ?   Capillary Refill: Capillary refill takes less than 2 seconds.  ?Neurological:  ?   General: No focal deficit present.  ?   Mental Status: He is alert and oriented to person, place, and time.  ?Psychiatric:     ?   Attention and Perception: Attention and perception normal.     ?   Mood and Affect: Mood is anxious and depressed. Affect is tearful.     ?   Speech: Speech normal.     ?   Behavior: Behavior normal. Behavior is cooperative.     ?   Thought Content: Thought content normal.     ?   Cognition and Memory: Cognition and memory normal.     ?   Judgment: Judgment normal.  ?  ? ? Assessment & Plan  ?  ?1. Generalized anxiety disorder ?Patient dealing with great deal of emotional and situational stress. Start citalopram '10mg'$  daily. Advise he speak with his [place] counselor about current situation to see if they can help in anyway. Will see him back in approximately 4 weeks to reassess.  ?- citalopram (CELEXA) 10 MG tablet; Take 1 tablet (10 mg total) by mouth daily.  Dispense: 30 tablet; Refill: 3 ? 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  ?- traZODone (DESYREL) 50 MG tablet; Take 0.5-1 tablets (25-50 mg total) by mouth at bedtime as needed  for sleep.  Dispense: 30 tablet; Refill: 1 ? ?3. Body mass index (BMI) of 33.0-33.9 in adult ?Encourage patient to limit calorie intake to 2000 cal/day or less.  He should consume a low cholesterol, low-fat diet.   ? ? ?Problem List Items Addressed This Visit   ? ?  ? Other  ? Generalized anxiety disorder - Primary  ? Relevant Medications  ? citalopram (CELEXA) 10 MG tablet  ? traZODone (DESYREL) 50 MG tablet  ? Body mass index (BMI) of 33.0-33.9 in adult  ? Insomnia  due to anxiety and fear  ? Relevant Medications  ? traZODone (DESYREL) 50 MG tablet  ?  ? ?Return in about 4 weeks (around 08/14/2021) for mood.  ?   ? ? ? ? ?Ronnell Freshwater, NP  ?[facility] ?[phone] (phone) ?[phone] (fax) ? ?[facility]  ?

## 2021-08-01 ENCOUNTER — Ambulatory Visit (INDEPENDENT_AMBULATORY_CARE_PROVIDER_SITE_OTHER): Payer: No Typology Code available for payment source | Admitting: Nurse Practitioner

## 2021-08-01 ENCOUNTER — Encounter: Payer: Self-pay | Admitting: Nurse Practitioner

## 2021-08-01 VITALS — BP 122/78 | HR 61 | Ht 68.0 in | Wt 223.6 lb

## 2021-08-01 DIAGNOSIS — J302 Other seasonal allergic rhinitis: Secondary | ICD-10-CM

## 2021-08-01 DIAGNOSIS — G4733 Obstructive sleep apnea (adult) (pediatric): Secondary | ICD-10-CM | POA: Diagnosis not present

## 2021-08-01 DIAGNOSIS — K219 Gastro-esophageal reflux disease without esophagitis: Secondary | ICD-10-CM

## 2021-08-01 DIAGNOSIS — J449 Chronic obstructive pulmonary disease, unspecified: Secondary | ICD-10-CM

## 2021-08-01 DIAGNOSIS — J309 Allergic rhinitis, unspecified: Secondary | ICD-10-CM | POA: Insufficient documentation

## 2021-08-01 MED ORDER — FLUTICASONE PROPIONATE 50 MCG/ACT NA SUSP: 1.0000 | Freq: Every day | NASAL | 2 refills | Status: DC

## 2021-08-01 MED ORDER — FLUTICASONE PROPIONATE 50 MCG/ACT NA SUSP
1.0000 | Freq: Every day | NASAL | 2 refills | Status: DC
Start: 2021-08-01 — End: 2021-08-01

## 2021-08-01 MED ORDER — FLUTICASONE PROPIONATE 50 MCG/ACT NA SUSP: 1.0000 | Freq: Every day | NASAL | 2 refills | Status: AC

## 2021-08-01 NOTE — Progress Notes (Signed)
? ?'@Patient'$  ID: Arthur Harper, male    DOB: Feb 28, 1946, 76 y.o.   MRN: [id number] ? ?Chief Complaint  ?Patient presents with  ? Follow-up  ?  Nothing new COPD f/u  ? ? ?Referring provider: ?Ronnell Freshwater, NP ? ?HPI: ?76 year old male, former smoker (40 pack years) followed for COPD Gold 2, pulmonary nodule. He is a patient of Dr. Gustavus Bryant and last seen in office on 06/20/2021. Past medical history significant for GERD, GAD, PTSD, insomnia, sleep apnea on CPAP.  ? ?TEST/EVENTS:  ?03/22/2021 PFTs: FVC 90, FEV1 72, ratio 57, TLC 108%, DLCO corrected for alveolar volume 82.  Positive BD (13% change).  Moderate obstructive airway disease with normal diffusion capacity ? ?06/20/2021: OV with Dr. Melvyn Novas. Slight increase in SOB with cough in evenings after supper. Added Prilosec 30 min prior to dinner and pepcid afterwards. No significant difference with singulair - advised he could stop if he didn't notice any improvement in symptoms.  ? ?08/01/2021: Today-follow-up ?Patient presents today with wife for follow-up.  He feels as though his cough has improved since starting on the Prilosec and Pepcid.  Also feels like his acid reflux is better controlled.  His breathing is overall stable.  He has not had any worsening shortness of breath or wheezing.  He does have some allergy type symptoms with occasional runny nose and sneezing.  Did not notice a significant difference on Singulair previously.  He denies orthopnea, PND or lower extremity swelling.  He continues on Symbicort twice daily and Spiriva once daily.  Rare use of albuterol.  Not on any over-the-counter allergy medicines. ? ?No Known Allergies ? ?Immunization History  ?Administered Date(s) Administered  ? Fluad Quad(high Dose 65+) 02/21/2021  ? Influenza Split 01/21/2011, 02/17/2012  ? Influenza Whole 01/20/2009  ? Influenza, High Dose Seasonal PF 05/05/2015, 01/22/2016, 02/06/2016, 02/14/2017, 01/18/2019  ? Influenza, Seasonal, Injecte, Preservative Fre 02/15/2009,  02/19/2010, 02/12/2011, 01/29/2012, 01/12/2013, 02/02/2014  ? Influenza-Unspecified 02/20/1998, 02/18/2000, 02/24/2001, 02/17/2002, 04/18/2003, 02/28/2004, 02/19/2005, 01/20/2006, 02/25/2007, 01/06/2008, 02/19/2010, 12/21/2012, 02/18/2018  ? Pneumococcal Conjugate-13 02/02/2014  ? Pneumococcal Polysaccharide-23 04/23/2007, 01/06/2008, 08/14/2012, 01/27/2013, 08/20/2016  ? Td 10/13/1997, 04/23/2004  ? Tdap 09/21/2006, 09/18/2011  ? Tetanus 08/24/2011  ? Varicella 04/22/2009  ? Zoster Recombinat (Shingrix) 02/10/2019, 04/12/2019  ? Zoster, Live 02/02/2014  ? ? ?Past Medical History:  ?Diagnosis Date  ? Agent orange exposure   ? COPD (chronic obstructive pulmonary disease) ([place])   ? PTSD (post-traumatic stress disorder)   ? Sleep apnea   ? ? ?Tobacco History: ?Social History  ? ?Tobacco Use  ?Smoking Status Former  ? Packs/day: 1.00  ? Years: 40.00  ? Pack years: 40.00  ? Types: Cigarettes  ? Quit date: 12/31/2008  ? Years since quitting: 12.5  ?Smokeless Tobacco Never  ?Tobacco Comments  ? 10 cigs per day  ? ?Counseling given: Not Answered ?Tobacco comments: 10 cigs per day ? ? ?Outpatient Medications Prior to Visit  ?Medication Sig Dispense Refill  ? albuterol (VENTOLIN HFA) 108 (90 Base) MCG/ACT inhaler Inhale 2 puffs into the lungs every 6 (six) hours as needed. 18 g 2  ? aspirin 81 MG chewable tablet Chew 81 mg by mouth daily.    ? budesonide-formoterol (SYMBICORT) 160-4.5 MCG/ACT inhaler Take 2 puffs first thing in am and then another 2 puffs about 12 hours later. 1 each 11  ? carboxymethylcellulose (REFRESH PLUS) 0.5 % SOLN INSTILL 1 DROP IN BOTH EYES FOUR TIMES A DAY AS NEEDED    ? citalopram (CELEXA) 10  MG tablet Take 1 tablet (10 mg total) by mouth daily. 30 tablet 3  ? cyclobenzaprine (FLEXERIL) 10 MG tablet Take 1 tablet (10 mg total) by mouth 2 (two) times daily as needed for up to 20 doses for muscle spasms. 20 tablet 0  ? hydrocortisone 2.5 % cream APPLY SMALL AMOUNT TO AFFECTED AREA TWICE A DAY AS NEEDED  -MIX WITH KETOCONAZOLE CREAM AND APPLY TO THE RED AREAS UNDER ARMPITS AND  IN THE GROIN TWICE DAILY AS NEEDED -MIX WITH KETOCONAZOLE CREAM AND APPLY TO THE RED AREAS UNDER ARMPITS AND   IN THE GROIN TWICE DAILY AS NEEDED    ? ketoconazole (NIZORAL) 2 % cream APPLY SMALL AMOUNT TO AFFECTED AREA TWICE A DAY -MIX WITH HYDROCORTISONE CREAM AND APPLY TO THE RED AREAS IN THE GROIN AND  ARMPIT TWICE DAILY AS NEEDED -MIX WITH HYDROCORTISONE CREAM AND APPLY TO THE RED AREAS IN THE GROIN AND  ARMPIT TWICE DAILY AS NEEDED    ? Multiple Vitamin (MULTIVITAMIN) capsule Take 1 capsule by mouth daily.    ? predniSONE (STERAPRED UNI-PAK 48 TAB) 10 MG (48) TBPK tablet 12 day taper - take by mouth as directed for 12 days 48 tablet 0  ? terazosin (HYTRIN) 5 MG capsule Take 5 mg by mouth at bedtime.    ? Tiotropium Bromide Monohydrate (SPIRIVA RESPIMAT) 2.5 MCG/ACT AERS Inhale 2 puffs into the lungs daily.    ? traZODone (DESYREL) 50 MG tablet Take 0.5-1 tablets (25-50 mg total) by mouth at bedtime as needed for sleep. 30 tablet 1  ? valACYclovir (VALTREX) 1000 MG tablet Take 1 tablet (1,000 mg total) by mouth 2 (two) times daily. 20 tablet 0  ? ?No facility-administered medications prior to visit.  ? ? ? ?Review of Systems:  ? ?Constitutional: No weight loss or gain, night sweats, fevers, chills, fatigue, or lassitude. ?HEENT: No headaches, difficulty swallowing, tooth/dental problems, or sore throat. No itching, ear ache. +occasional sneezing, nasal drainage (clear) ?CV:  No chest pain, orthopnea, PND, swelling in lower extremities, anasarca, dizziness, palpitations, syncope ?Resp: +shortness of breath with strenuous activity (stable); rare cough (improved). No excess mucus or change in color of mucus. No hemoptysis. No wheezing.  No chest wall deformity ?GI:  No heartburn, indigestion, abdominal pain, nausea, vomiting, diarrhea, change in bowel habits, loss of appetite, bloody stools.  ?Skin: No rash, lesions, ulcerations ?Neuro: No  dizziness or lightheadedness.  ?Psych: No depression or anxiety. Mood stable.  ? ? ? ?Physical Exam: ? ?BP 122/78 (BP Location: Right Arm, Cuff Size: Large)   Pulse 61   Ht $R'5\' 8"'GE$  (1.727 m)   Wt 223 lb 9.6 oz (101.4 kg)   SpO2 96%   BMI 34.00 kg/m?  ? ?GEN: Pleasant, interactive, well-appearing; obese; in no acute distress. ?HEENT:  Normocephalic and atraumatic. PERRLA. Sclera white. Nasal turbinates erythematous, moist and patent bilaterally. Clear rhinorrhea present. Oropharynx pink and moist, without exudate or edema. No lesions, ulcerations, or postnasal drip.  ?NECK:  Supple w/ fair ROM. No JVD present. Normal carotid impulses w/o bruits. Thyroid symmetrical with no goiter or nodules palpated. No lymphadenopathy.   ?CV: RRR, no m/r/g, no peripheral edema. Pulses intact, +2 bilaterally. No cyanosis, pallor or clubbing. ?PULMONARY:  Unlabored, regular breathing. Clear bilaterally A&P w/o wheezes/rales/rhonchi. No accessory muscle use. No dullness to percussion. ?GI: BS present and normoactive. Soft, non-tender to palpation. No organomegaly or masses detected. No CVA tenderness. ?Neuro: A/Ox3. No focal deficits noted.   ?Skin: Warm, no lesions or  rashe ?Psych: Normal affect and behavior. Judgement and thought content appropriate.  ? ? ? ?Lab Results: ? ?CBC ?   ?Component Value Date/Time  ? WBC 7.0 05/15/2021 2001  ? RBC 4.59 05/15/2021 2001  ? HGB 14.9 05/15/2021 2001  ? HCT 44.0 05/15/2021 2001  ? PLT 250 05/15/2021 2001  ? MCV 95.9 05/15/2021 2001  ? MCH 32.5 05/15/2021 2001  ? MCHC 33.9 05/15/2021 2001  ? RDW 11.9 05/15/2021 2001  ? LYMPHSABS 1.5 05/15/2021 2001  ? MONOABS 0.5 05/15/2021 2001  ? EOSABS 0.3 05/15/2021 2001  ? BASOSABS 0.0 05/15/2021 2001  ? ? ?BMET ?   ?Component Value Date/Time  ? NA 139 05/15/2021 2001  ? K 4.2 05/15/2021 2001  ? CL 104 05/15/2021 2001  ? CO2 26 05/15/2021 2001  ? GLUCOSE 84 05/15/2021 2001  ? BUN 20 05/15/2021 2001  ? CREATININE 0.92 05/15/2021 2001  ? CALCIUM 9.6  05/15/2021 2001  ? GFRNONAA >60 05/15/2021 2001  ? GFRAA  12/19/2008 0500  ?  >60        ?The eGFR has been calculated ?using the MDRD equation. ?This calculation has not been ?validated in all clinical ?situa

## 2021-08-01 NOTE — Assessment & Plan Note (Signed)
Compliant with CPAP nightly per pt. Receives good benefit. Managed by [facility]. ?

## 2021-08-01 NOTE — Assessment & Plan Note (Signed)
Improved with Prilosec and pepcid regimen.  ?

## 2021-08-01 NOTE — Assessment & Plan Note (Signed)
Advised OTC 2nd gen antihistamine. Flonase for postnasal drainage control. Rx faxed to Dr. Nyoka Cowden with [facility] so he can have med filled there.  ?

## 2021-08-01 NOTE — Patient Instructions (Addendum)
Continue Albuterol inhaler 2 puffs every 6 hours as needed for shortness of breath or wheezing. Notify if symptoms persist despite rescue inhaler/neb use. ?Continue Symbicort 2 puffs Twice daily. Brush tongue and rinse mouth afterwards  ?Continue Spiriva 2 puffs daily  ?Continue Prilosec otc '20mg'$   Take 30-60 min before first meal of the day Continue Pepcid (famotidine) 20 mg one after supper  ? ?-Over the counter allergy medicine daily as needed for allergies such as Claritin or Zyrtec  ?-Flonase (fluticasone) nasal 1-2 sprays each nostril daily as needed for allergies  ? ?Exercise goal 150 min/week  ? ?Follow up in 3 months with Dr. Melvyn Novas or Alanson Aly. If symptoms do not improve or worsen, please contact office for sooner follow up or seek emergency care. ?

## 2021-08-01 NOTE — Assessment & Plan Note (Signed)
Overall stable and well-controlled on current maintenance regimen. Continue triple therapy inhalers. Cough improved with GERD control. Rare, dry cough - suspect related to postnasal drainage d/t allergy type symptoms. Exercise encouraged.  ? ?Patient Instructions  ?Continue Albuterol inhaler 2 puffs every 6 hours as needed for shortness of breath or wheezing. Notify if symptoms persist despite rescue inhaler/neb use. ?Continue Symbicort 2 puffs Twice daily. Brush tongue and rinse mouth afterwards  ?Continue Spiriva 2 puffs daily  ?Continue Prilosec otc '20mg'$   Take 30-60 min before first meal of the day Continue Pepcid (famotidine) 20 mg one after supper  ? ?-Over the counter allergy medicine daily as needed for allergies such as Claritin or Zyrtec  ?-Flonase (fluticasone) nasal 1-2 sprays each nostril daily as needed for allergies  ? ?Exercise goal 150 min/week  ? ?Follow up in 3 months with Dr. Melvyn Novas or Alanson Aly. If symptoms do not improve or worsen, please contact office for sooner follow up or seek emergency care. ? ? ?

## 2021-08-14 ENCOUNTER — Encounter: Payer: Self-pay | Admitting: Nurse Practitioner

## 2021-08-14 ENCOUNTER — Ambulatory Visit (INDEPENDENT_AMBULATORY_CARE_PROVIDER_SITE_OTHER): Payer: No Typology Code available for payment source | Admitting: Nurse Practitioner

## 2021-08-14 VITALS — BP 118/69 | HR 59 | Temp 97.5°F | Ht 68.11 in | Wt 221.2 lb

## 2021-08-14 DIAGNOSIS — J449 Chronic obstructive pulmonary disease, unspecified: Secondary | ICD-10-CM | POA: Diagnosis not present

## 2021-08-14 DIAGNOSIS — F5105 Insomnia due to other mental disorder: Secondary | ICD-10-CM | POA: Diagnosis not present

## 2021-08-14 DIAGNOSIS — Z6833 Body mass index (BMI) 33.0-33.9, adult: Secondary | ICD-10-CM

## 2021-08-14 DIAGNOSIS — F409 Phobic anxiety disorder, unspecified: Secondary | ICD-10-CM

## 2021-08-14 DIAGNOSIS — J302 Other seasonal allergic rhinitis: Secondary | ICD-10-CM

## 2021-08-14 DIAGNOSIS — F411 Generalized anxiety disorder: Secondary | ICD-10-CM

## 2021-08-14 NOTE — Progress Notes (Signed)
Established patient visit ? ? ?Patient: Arthur Harper   DOB: Aug 04, 1945   76 y.o. Male  MRN: [id number] ?Visit Date: 08/14/2021 ? ? ?Chief Complaint  ?Patient presents with  ? Follow-up  ? ?Subjective  ?  ?HPI  ?Follow up for mood.  ?-having significant situational and family related stress ?-started him on celexa '10mg'$  daily ?-trazodone given to use as needed to reduce insomnia.  ?-states that he is doing much better. States that he "still has his issues" but is not getting as upset. Is sleeping some better. Going to bed earlier and getting up earlier.  ?-is having some trouble with allergies. Does see pulmonology routinely  ? ? ?Medications: ?Outpatient Medications Prior to Visit  ?Medication Sig  ? albuterol (VENTOLIN HFA) 108 (90 Base) MCG/ACT inhaler Inhale 2 puffs into the lungs every 6 (six) hours as needed.  ? aspirin 81 MG chewable tablet Chew 81 mg by mouth daily.  ? budesonide-formoterol (SYMBICORT) 160-4.5 MCG/ACT inhaler Take 2 puffs first thing in am and then another 2 puffs about 12 hours later.  ? carboxymethylcellulose (REFRESH PLUS) 0.5 % SOLN INSTILL 1 DROP IN BOTH EYES FOUR TIMES A DAY AS NEEDED  ? citalopram (CELEXA) 10 MG tablet Take 1 tablet (10 mg total) by mouth daily.  ? cyclobenzaprine (FLEXERIL) 10 MG tablet Take 1 tablet (10 mg total) by mouth 2 (two) times daily as needed for up to 20 doses for muscle spasms.  ? fluticasone (FLONASE) 50 MCG/ACT nasal spray Place 1-2 sprays into both nostrils daily.  ? hydrocortisone 2.5 % cream APPLY SMALL AMOUNT TO AFFECTED AREA TWICE A DAY AS NEEDED -MIX WITH KETOCONAZOLE CREAM AND APPLY TO THE RED AREAS UNDER ARMPITS AND  IN THE GROIN TWICE DAILY AS NEEDED -MIX WITH KETOCONAZOLE CREAM AND APPLY TO THE RED AREAS UNDER ARMPITS AND   IN THE GROIN TWICE DAILY AS NEEDED  ? ketoconazole (NIZORAL) 2 % cream APPLY SMALL AMOUNT TO AFFECTED AREA TWICE A DAY -MIX WITH HYDROCORTISONE CREAM AND APPLY TO THE RED AREAS IN THE GROIN AND  ARMPIT TWICE DAILY AS  NEEDED -MIX WITH HYDROCORTISONE CREAM AND APPLY TO THE RED AREAS IN THE GROIN AND  ARMPIT TWICE DAILY AS NEEDED  ? Multiple Vitamin (MULTIVITAMIN) capsule Take 1 capsule by mouth daily.  ? predniSONE (STERAPRED UNI-PAK 48 TAB) 10 MG (48) TBPK tablet 12 day taper - take by mouth as directed for 12 days  ? terazosin (HYTRIN) 5 MG capsule Take 5 mg by mouth at bedtime.  ? Tiotropium Bromide Monohydrate (SPIRIVA RESPIMAT) 2.5 MCG/ACT AERS Inhale 2 puffs into the lungs daily.  ? traZODone (DESYREL) 50 MG tablet Take 0.5-1 tablets (25-50 mg total) by mouth at bedtime as needed for sleep.  ? valACYclovir (VALTREX) 1000 MG tablet Take 1 tablet (1,000 mg total) by mouth 2 (two) times daily.  ? ?No facility-administered medications prior to visit.  ? ? ?Review of Systems  ?Constitutional:  Positive for fatigue. Negative for activity change, chills and fever.  ?HENT:  Positive for congestion and sneezing. Negative for postnasal drip, rhinorrhea, sinus pressure, sinus pain and sore throat.   ?Eyes: Negative.   ?Respiratory:  Negative for cough, shortness of breath and wheezing.   ?Cardiovascular:  Negative for chest pain and palpitations.  ?Gastrointestinal:  Negative for constipation, diarrhea, nausea and vomiting.  ?Endocrine: Negative for cold intolerance, heat intolerance, polydipsia and polyuria.  ?Genitourinary:  Negative for dysuria, frequency and urgency.  ?Musculoskeletal:  Negative for back pain and myalgias.  ?  Skin:  Negative for rash.  ?Allergic/Immunologic: Negative for environmental allergies.  ?Neurological:  Negative for dizziness, weakness and headaches.  ?Psychiatric/Behavioral:  Positive for dysphoric mood and sleep disturbance. The patient is nervous/anxious.   ?     Improved mood since his last visit and starting on celexa. Sleeping better on '25mg'$  trazodone at night if needed   ? ? ? ? Objective  ?  ? ?Today's Vitals  ? 08/14/21 1049  ?BP: 118/69  ?Pulse: (!) 59  ?Temp: (!) 97.5 ?F (36.4 ?C)  ?SpO2: 95%   ?Weight: 221 lb 3.2 oz (100.3 kg)  ?Height: 5' 8.11" (1.73 m)  ? ?Body mass index is 33.52 kg/m?.  ? ?BP Readings from Last 3 Encounters:  ?08/14/21 118/69  ?08/01/21 122/78  ?07/17/21 113/62  ?  ?Wt Readings from Last 3 Encounters:  ?08/14/21 221 lb 3.2 oz (100.3 kg)  ?08/01/21 223 lb 9.6 oz (101.4 kg)  ?07/17/21 222 lb 6.4 oz (100.9 kg)  ?  ?Physical Exam ?Vitals and nursing note reviewed.  ?Constitutional:   ?   Appearance: Normal appearance. He is well-developed.  ?HENT:  ?   Head: Normocephalic and atraumatic.  ?Eyes:  ?   Pupils: Pupils are equal, round, and reactive to light.  ?Cardiovascular:  ?   Rate and Rhythm: Normal rate and regular rhythm.  ?   Pulses: Normal pulses.  ?   Heart sounds: Normal heart sounds.  ?Pulmonary:  ?   Effort: Pulmonary effort is normal.  ?   Breath sounds: Normal breath sounds.  ?   Comments: Scant expiratory wheezing in bilateral lung fields  ?Abdominal:  ?   Palpations: Abdomen is soft.  ?Musculoskeletal:     ?   General: Normal range of motion.  ?   Cervical back: Normal range of motion and neck supple.  ?Lymphadenopathy:  ?   Cervical: No cervical adenopathy.  ?Skin: ?   General: Skin is warm and dry.  ?   Capillary Refill: Capillary refill takes less than 2 seconds.  ?Neurological:  ?   General: No focal deficit present.  ?   Mental Status: He is alert and oriented to person, place, and time.  ?Psychiatric:     ?   Mood and Affect: Mood normal.     ?   Behavior: Behavior normal.     ?   Thought Content: Thought content normal.     ?   Judgment: Judgment normal.  ?  ? ? Assessment & Plan  ?  ?1. Generalized anxiety disorder ?Improved. Continue citalopram '10mg'$  daily. Patient does plan to set up counseling services through [place] ? 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 May continue trazodone at night as needed and as prescribed  ? ?3. Seasonal allergic rhinitis, unspecified trigger ?Currently active. Continue claritin and flonase nasal spray as prescribed  ? ?4. COPD   GOLD 2  ?Stable. Continue regular visits with pulmonology  ? ?5. Body mass index (BMI) of 33.0-33.9 in adult ?Encourage patient to limit calorie intake to 2000 cal/day or less.  He should consume a low cholesterol, low-fat diet.   ? ? ?Problem List Items Addressed This Visit   ? ?  ? Respiratory  ? COPD  GOLD 2   ? Allergic rhinitis  ?  ? Other  ? Generalized anxiety disorder - Primary  ? Body mass index (BMI) of 33.0-33.9 in adult  ? Insomnia due to anxiety and fear  ?  ? ?Return for as scheduled.  ?   ? ? ? ? ?  Ronnell Freshwater, NP  ?[facility] ?[phone] (phone) ?[phone] (fax) ? ?[facility]  ?

## 2021-09-05 ENCOUNTER — Other Ambulatory Visit: Payer: Self-pay

## 2021-09-05 DIAGNOSIS — Z125 Encounter for screening for malignant neoplasm of prostate: Secondary | ICD-10-CM

## 2021-09-05 DIAGNOSIS — Z1329 Encounter for screening for other suspected endocrine disorder: Secondary | ICD-10-CM

## 2021-09-05 DIAGNOSIS — Z0000 Encounter for general adult medical examination without abnormal findings: Secondary | ICD-10-CM

## 2021-09-07 ENCOUNTER — Other Ambulatory Visit: Payer: Non-veteran care

## 2021-09-07 DIAGNOSIS — Z0000 Encounter for general adult medical examination without abnormal findings: Secondary | ICD-10-CM

## 2021-09-07 DIAGNOSIS — Z1321 Encounter for screening for nutritional disorder: Secondary | ICD-10-CM | POA: Diagnosis not present

## 2021-09-07 DIAGNOSIS — Z1329 Encounter for screening for other suspected endocrine disorder: Secondary | ICD-10-CM | POA: Diagnosis not present

## 2021-09-07 DIAGNOSIS — Z125 Encounter for screening for malignant neoplasm of prostate: Secondary | ICD-10-CM

## 2021-09-07 DIAGNOSIS — Z130 Encounter for screening for diseases of the blood and blood-forming organs and certain disorders involving the immune mechanism: Secondary | ICD-10-CM | POA: Diagnosis not present

## 2021-09-07 DIAGNOSIS — Z13228 Encounter for screening for other metabolic disorders: Secondary | ICD-10-CM | POA: Diagnosis not present

## 2021-09-08 LAB — CBC WITH DIFFERENTIAL/PLATELET
Basophils Absolute: 0 10*3/uL (ref 0.0–0.2)
Basos: 0 %
EOS (ABSOLUTE): 0.2 10*3/uL (ref 0.0–0.4)
Eos: 3 %
Hematocrit: 41.9 % (ref 37.5–51.0)
Hemoglobin: 14.6 g/dL (ref 13.0–17.7)
Immature Grans (Abs): 0 10*3/uL (ref 0.0–0.1)
Immature Granulocytes: 0 %
Lymphocytes Absolute: 1.1 10*3/uL (ref 0.7–3.1)
Lymphs: 19 %
MCH: 34 pg — ABNORMAL HIGH (ref 26.6–33.0)
MCHC: 34.8 g/dL (ref 31.5–35.7)
MCV: 98 fL — ABNORMAL HIGH (ref 79–97)
Monocytes Absolute: 0.5 10*3/uL (ref 0.1–0.9)
Monocytes: 9 %
Neutrophils Absolute: 4.1 10*3/uL (ref 1.4–7.0)
Neutrophils: 69 %
Platelets: 261 10*3/uL (ref 150–450)
RBC: 4.29 x10E6/uL (ref 4.14–5.80)
RDW: 12.3 % (ref 11.6–15.4)
WBC: 6 10*3/uL (ref 3.4–10.8)

## 2021-09-08 LAB — LIPID PANEL
Chol/HDL Ratio: 2.4 ratio (ref 0.0–5.0)
Cholesterol, Total: 162 mg/dL (ref 100–199)
HDL: 67 mg/dL (ref >39)
LDL Chol Calc (NIH): 80 mg/dL (ref 0–99)
Triglycerides: 78 mg/dL (ref 0–149)
VLDL Cholesterol Cal: 15 mg/dL (ref 5–40)

## 2021-09-08 LAB — COMPREHENSIVE METABOLIC PANEL
ALT: 21 IU/L (ref 0–44)
AST: 20 IU/L (ref 0–40)
Albumin/Globulin Ratio: 2 (ref 1.2–2.2)
Albumin: 4.4 g/dL (ref 3.7–4.7)
Alkaline Phosphatase: 82 IU/L (ref 44–121)
BUN/Creatinine Ratio: 16 (ref 10–24)
BUN: 15 mg/dL (ref 8–27)
Bilirubin Total: 0.6 mg/dL (ref 0.0–1.2)
CO2: 24 mmol/L (ref 20–29)
Calcium: 9.4 mg/dL (ref 8.6–10.2)
Chloride: 101 mmol/L (ref 96–106)
Creatinine, Ser: 0.92 mg/dL (ref 0.76–1.27)
Globulin, Total: 2.2 g/dL (ref 1.5–4.5)
Glucose: 104 mg/dL — ABNORMAL HIGH (ref 70–99)
Potassium: 4.3 mmol/L (ref 3.5–5.2)
Sodium: 138 mmol/L (ref 134–144)
Total Protein: 6.6 g/dL (ref 6.0–8.5)
eGFR: 87 mL/min/{1.73_m2} (ref >59)

## 2021-09-08 LAB — TSH: TSH: 3.05 u[IU]/mL (ref 0.450–4.500)

## 2021-09-08 LAB — PSA: Prostate Specific Ag, Serum: 4.4 ng/mL — ABNORMAL HIGH (ref 0.0–4.0)

## 2021-09-08 LAB — HEMOGLOBIN A1C
Est. average glucose Bld gHb Est-mCnc: 108 mg/dL
Hgb A1c MFr Bld: 5.4 % (ref 4.8–5.6)

## 2021-09-10 ENCOUNTER — Ambulatory Visit: Payer: Non-veteran care | Admitting: Nurse Practitioner

## 2021-09-12 NOTE — Progress Notes (Signed)
Elevated PSA - other labs essentially normal. Discuss at visit 09/19/2021.

## 2021-09-19 ENCOUNTER — Encounter: Payer: Self-pay | Admitting: Nurse Practitioner

## 2021-09-19 ENCOUNTER — Ambulatory Visit (INDEPENDENT_AMBULATORY_CARE_PROVIDER_SITE_OTHER): Payer: No Typology Code available for payment source | Admitting: Nurse Practitioner

## 2021-09-19 VITALS — Ht 68.0 in | Wt 225.0 lb

## 2021-09-19 DIAGNOSIS — H903 Sensorineural hearing loss, bilateral: Secondary | ICD-10-CM | POA: Insufficient documentation

## 2021-09-19 DIAGNOSIS — J449 Chronic obstructive pulmonary disease, unspecified: Secondary | ICD-10-CM

## 2021-09-19 DIAGNOSIS — E663 Overweight: Secondary | ICD-10-CM | POA: Insufficient documentation

## 2021-09-19 DIAGNOSIS — Z9989 Dependence on other enabling machines and devices: Secondary | ICD-10-CM | POA: Insufficient documentation

## 2021-09-19 DIAGNOSIS — F411 Generalized anxiety disorder: Secondary | ICD-10-CM

## 2021-09-19 DIAGNOSIS — R911 Solitary pulmonary nodule: Secondary | ICD-10-CM | POA: Insufficient documentation

## 2021-09-19 DIAGNOSIS — K0252 Dental caries on pit and fissure surface penetrating into dentin: Secondary | ICD-10-CM | POA: Insufficient documentation

## 2021-09-19 DIAGNOSIS — F17201 Nicotine dependence, unspecified, in remission: Secondary | ICD-10-CM | POA: Insufficient documentation

## 2021-09-19 DIAGNOSIS — R399 Unspecified symptoms and signs involving the genitourinary system: Secondary | ICD-10-CM | POA: Insufficient documentation

## 2021-09-19 DIAGNOSIS — Z7689 Persons encountering health services in other specified circumstances: Secondary | ICD-10-CM | POA: Insufficient documentation

## 2021-09-19 DIAGNOSIS — S0450XA Injury of facial nerve, unspecified side, initial encounter: Secondary | ICD-10-CM | POA: Insufficient documentation

## 2021-09-19 DIAGNOSIS — N400 Enlarged prostate without lower urinary tract symptoms: Secondary | ICD-10-CM | POA: Insufficient documentation

## 2021-09-19 DIAGNOSIS — R918 Other nonspecific abnormal finding of lung field: Secondary | ICD-10-CM | POA: Insufficient documentation

## 2021-09-19 DIAGNOSIS — D126 Benign neoplasm of colon, unspecified: Secondary | ICD-10-CM | POA: Insufficient documentation

## 2021-09-19 DIAGNOSIS — D485 Neoplasm of uncertain behavior of skin: Secondary | ICD-10-CM | POA: Insufficient documentation

## 2021-09-19 DIAGNOSIS — Z1211 Encounter for screening for malignant neoplasm of colon: Secondary | ICD-10-CM | POA: Insufficient documentation

## 2021-09-19 DIAGNOSIS — H2513 Age-related nuclear cataract, bilateral: Secondary | ICD-10-CM | POA: Insufficient documentation

## 2021-09-19 DIAGNOSIS — L570 Actinic keratosis: Secondary | ICD-10-CM | POA: Insufficient documentation

## 2021-09-19 DIAGNOSIS — H04123 Dry eye syndrome of bilateral lacrimal glands: Secondary | ICD-10-CM | POA: Insufficient documentation

## 2021-09-19 DIAGNOSIS — Z461 Encounter for fitting and adjustment of hearing aid: Secondary | ICD-10-CM | POA: Insufficient documentation

## 2021-09-19 DIAGNOSIS — R3129 Other microscopic hematuria: Secondary | ICD-10-CM | POA: Insufficient documentation

## 2021-09-19 DIAGNOSIS — Z0000 Encounter for general adult medical examination without abnormal findings: Secondary | ICD-10-CM

## 2021-09-19 DIAGNOSIS — Z86010 Personal history of colonic polyps: Secondary | ICD-10-CM | POA: Insufficient documentation

## 2021-09-19 DIAGNOSIS — U071 COVID-19: Secondary | ICD-10-CM | POA: Insufficient documentation

## 2021-09-19 DIAGNOSIS — F331 Major depressive disorder, recurrent, moderate: Secondary | ICD-10-CM | POA: Insufficient documentation

## 2021-09-19 HISTORY — DX: Benign neoplasm of colon, unspecified: D12.6

## 2021-09-19 NOTE — Patient Instructions (Signed)
Preventive Care 65 Years and Older, Male Preventive care refers to lifestyle choices and visits with your health care provider that can promote health and wellness. Preventive care visits are also called wellness exams. What can I expect for my preventive care visit? Counseling During your preventive care visit, your health care provider may ask about your: Medical history, including: Past medical problems. Family medical history. History of falls. Current health, including: Emotional well-being. Home life and relationship well-being. Sexual activity. Memory and ability to understand (cognition). Lifestyle, including: Alcohol, nicotine or tobacco, and drug use. Access to firearms. Diet, exercise, and sleep habits. Work and work environment. Sunscreen use. Safety issues such as seatbelt and bike helmet use. Physical exam Your health care provider will check your: Height and weight. These may be used to calculate your BMI (body mass index). BMI is a measurement that tells if you are at a healthy weight. Waist circumference. This measures the distance around your waistline. This measurement also tells if you are at a healthy weight and may help predict your risk of certain diseases, such as type 2 diabetes and high blood pressure. Heart rate and blood pressure. Body temperature. Skin for abnormal spots. What immunizations do I need?  Vaccines are usually given at various ages, according to a schedule. Your health care provider will recommend vaccines for you based on your age, medical history, and lifestyle or other factors, such as travel or where you work. What tests do I need? Screening Your health care provider may recommend screening tests for certain conditions. This may include: Lipid and cholesterol levels. Diabetes screening. This is done by checking your blood sugar (glucose) after you have not eaten for a while (fasting). Hepatitis C test. Hepatitis B test. HIV (human  immunodeficiency virus) test. STI (sexually transmitted infection) testing, if you are at risk. Lung cancer screening. Colorectal cancer screening. Prostate cancer screening. Abdominal aortic aneurysm (AAA) screening. You may need this if you are a current or former smoker. Talk with your health care provider about your test results, treatment options, and if necessary, the need for more tests. Follow these instructions at home: Eating and drinking  Eat a diet that includes fresh fruits and vegetables, whole grains, lean protein, and low-fat dairy products. Limit your intake of foods with high amounts of sugar, saturated fats, and salt. Take vitamin and mineral supplements as recommended by your health care provider. Do not drink alcohol if your health care provider tells you not to drink. If you drink alcohol: Limit how much you have to 0-2 drinks a day. Know how much alcohol is in your drink. In the [place] one drink equals one 12 oz bottle of beer (355 mL), one 5 oz glass of wine (148 mL), or one 1 oz glass of hard liquor (44 mL). Lifestyle Brush your teeth every morning and night with fluoride toothpaste. Floss one time each day. Exercise for at least 30 minutes 5 or more days each week. Do not use any products that contain nicotine or tobacco. These products include cigarettes, chewing tobacco, and vaping devices, such as e-cigarettes. If you need help quitting, ask your health care provider. Do not use drugs. If you are sexually active, practice safe sex. Use a condom or other form of protection to prevent STIs. Take aspirin only as told by your health care provider. Make sure that you understand how much to take and what form to take. Work with your health care provider to find out whether it is safe   and beneficial for you to take aspirin daily. Ask your health care provider if you need to take a cholesterol-lowering medicine (statin). Find healthy ways to manage stress, such  as: Meditation, yoga, or listening to music. Journaling. Talking to a trusted person. Spending time with friends and family. Safety Always wear your seat belt while driving or riding in a vehicle. Do not drive: If you have been drinking alcohol. Do not ride with someone who has been drinking. When you are tired or distracted. While texting. If you have been using any mind-altering substances or drugs. Wear a helmet and other protective equipment during sports activities. If you have firearms in your house, make sure you follow all gun safety procedures. Minimize exposure to UV radiation to reduce your risk of skin cancer. What's next? Visit your health care provider once a year for an annual wellness visit. Ask your health care provider how often you should have your eyes and teeth checked. Stay up to date on all vaccines. This information is not intended to replace advice given to you by your health care provider. Make sure you discuss any questions you have with your health care provider. Document Revised: 10/04/2020 Document Reviewed: 10/04/2020 Elsevier Patient Education  2023 [employer]  

## 2021-09-19 NOTE — Progress Notes (Signed)
Subjective:   Arthur Harper is a 76 y.o. male who presents for Medicare Annual/Subsequent preventive examination.  Review of Systems    Refer to PCP  I connected with  LEONCIO HANSEN on 09/23/21 by an audio only telemedicine application and verified that I am speaking with the correct person using two identifiers.   I discussed the limitations, risks, security and privacy concerns of performing an evaluation and management service by telephone and the availability of in person appointments. I also discussed with the patient that there may be a patient responsible charge related to this service. The patient expressed understanding and verbally consented to this telephonic visit.  Location of Patient: Home Location of Provider: Office  List any persons and their role that are participating in the visit with the patient.  Review of Systems  Constitutional:  Positive for malaise/fatigue. Negative for chills and fever.  HENT:  Negative for congestion, sinus pain and sore throat.   Eyes: Negative.   Respiratory:  Negative for cough, shortness of breath and wheezing.   Cardiovascular:  Negative for chest pain, palpitations and leg swelling.  Gastrointestinal:  Negative for constipation, diarrhea, nausea and vomiting.  Genitourinary: Negative.   Musculoskeletal:  Negative for myalgias.  Skin: Negative.   Neurological:  Negative for dizziness and headaches.  Endo/Heme/Allergies:  Does not bruise/bleed easily.  Psychiatric/Behavioral:  Negative for depression. The patient is nervous/anxious.        Sleeping difficulties.        Objective:    Today's Vitals   09/19/21 1049  Weight: 225 lb (102.1 kg)  Height: '5\' 8"'$  (1.727 m)   Body mass index is 34.21 kg/m.     05/15/2021    5:37 PM 04/26/2016    1:00 PM  Advanced Directives  Does Patient Have a Medical Advance Directive? Yes No  Type of Advance Directive Living will;Healthcare Power of Attorney   Would patient like  information on creating a medical advance directive?  Yes (MAU/Ambulatory/Procedural Areas - Information given)    Current Medications (verified) Outpatient Encounter Medications as of 09/19/2021  Medication Sig   albuterol (VENTOLIN HFA) 108 (90 Base) MCG/ACT inhaler Inhale 2 puffs into the lungs every 6 (six) hours as needed.   aspirin 81 MG chewable tablet Chew 81 mg by mouth daily.   budesonide-formoterol (SYMBICORT) 160-4.5 MCG/ACT inhaler Take 2 puffs first thing in am and then another 2 puffs about 12 hours later.   carboxymethylcellulose (REFRESH PLUS) 0.5 % SOLN INSTILL 1 DROP IN BOTH EYES FOUR TIMES A DAY AS NEEDED   cyclobenzaprine (FLEXERIL) 10 MG tablet Take 1 tablet (10 mg total) by mouth 2 (two) times daily as needed for up to 20 doses for muscle spasms.   fluticasone (FLONASE) 50 MCG/ACT nasal spray Place 1-2 sprays into both nostrils daily.   hydrocortisone 2.5 % cream APPLY SMALL AMOUNT TO AFFECTED AREA TWICE A DAY AS NEEDED -MIX WITH KETOCONAZOLE CREAM AND APPLY TO THE RED AREAS UNDER ARMPITS AND  IN THE GROIN TWICE DAILY AS NEEDED -MIX WITH KETOCONAZOLE CREAM AND APPLY TO THE RED AREAS UNDER ARMPITS AND   IN THE GROIN TWICE DAILY AS NEEDED   ketoconazole (NIZORAL) 2 % cream APPLY SMALL AMOUNT TO AFFECTED AREA TWICE A DAY -MIX WITH HYDROCORTISONE CREAM AND APPLY TO THE RED AREAS IN THE GROIN AND  ARMPIT TWICE DAILY AS NEEDED -MIX WITH HYDROCORTISONE CREAM AND APPLY TO THE RED AREAS IN THE GROIN AND  ARMPIT TWICE DAILY AS NEEDED  Multiple Vitamin (MULTIVITAMIN) capsule Take 1 capsule by mouth daily.   terazosin (HYTRIN) 5 MG capsule Take 5 mg by mouth at bedtime.   Tiotropium Bromide Monohydrate (SPIRIVA RESPIMAT) 2.5 MCG/ACT AERS Inhale 2 puffs into the lungs daily.   valACYclovir (VALTREX) 1000 MG tablet Take 1 tablet (1,000 mg total) by mouth 2 (two) times daily.   [DISCONTINUED] citalopram (CELEXA) 10 MG tablet Take 1 tablet (10 mg total) by mouth daily.   [DISCONTINUED]  traZODone (DESYREL) 50 MG tablet Take 0.5-1 tablets (25-50 mg total) by mouth at bedtime as needed for sleep.   [DISCONTINUED] predniSONE (STERAPRED UNI-PAK 48 TAB) 10 MG (48) TBPK tablet 12 day taper - take by mouth as directed for 12 days (Patient not taking: Reported on 09/19/2021)   No facility-administered encounter medications on file as of 09/19/2021.    Allergies (verified) Patient has no known allergies.   History: Past Medical History:  Diagnosis Date   Agent orange exposure    COPD (chronic obstructive pulmonary disease) (HCC)    PTSD (post-traumatic stress disorder)    Sleep apnea    Past Surgical History:  Procedure Laterality Date   fx ribs     TONSILLECTOMY     Family History  Problem Relation Age of Onset   Emphysema Mother    Emphysema Father    Colon cancer Neg Hx    Social History   Socioeconomic History   Marital status: Divorced    Spouse name: Not on file   Number of children: 2   Years of education: Not on file   Highest education level: Not on file  Occupational History   Occupation: retired    Comment: Primary school teacher  Tobacco Use   Smoking status: Former    Packs/day: 1.00    Years: 40.00    Pack years: 40.00    Types: Cigarettes    Quit date: 12/31/2008    Years since quitting: 12.7   Smokeless tobacco: Never   Tobacco comments:    10 cigs per day  Vaping Use   Vaping Use: Never used  Substance and Sexual Activity   Alcohol use: Yes    Alcohol/week: 3.0 - 4.0 standard drinks    Types: 3 - 4 Cans of beer per week    Comment: occassional, 3 beers or glasses of wine per week   Drug use: No   Sexual activity: Yes    Partners: Female  Other Topics Concern   Not on file  Social History Narrative   Not on file   Social Determinants of Health   Financial Resource Strain: Low Risk    Difficulty of Paying Living Expenses: Not hard at all  Food Insecurity: No Food Insecurity   Worried About Charity fundraiser in the Last Year: Never  true   [place] in the Last Year: Never true  Transportation Needs: No Transportation Needs   Lack of Transportation (Medical): No   Lack of Transportation (Non-Medical): No  Physical Activity: Insufficiently Active   Days of Exercise per Week: 1 day   Minutes of Exercise per Session: 30 min  Stress: No Stress Concern Present   Feeling of Stress : Only a little  Social Connections: Moderately Integrated   Frequency of Communication with Friends and Family: Twice a week   Frequency of Social Gatherings with Friends and Family: Once a week   Attends Religious Services: 1 to 4 times per year   Active Member of [employer] or Organizations:  Yes   Attends Club or Organization Meetings: 1 to 4 times per year   Marital Status: Divorced    Tobacco Counseling Counseling given: Not Answered Tobacco comments: 10 cigs per day   Diabetic?No    Activities of Daily Living    09/20/2021    9:33 AM 09/19/2021   10:56 AM  In your present state of health, do you have any difficulty performing the following activities:  Hearing? 1 1  Vision? 0 0  Difficulty concentrating or making decisions? 0 0  Walking or climbing stairs? 0 1  Dressing or bathing? 0 0  Doing errands, shopping? 0 0    Patient Care Team: Ronnell Freshwater, NP as PCP - General (Family Medicine)  Indicate any recent Medical Services you may have received from other than [facility] providers in the past year (date may be approximate).     Assessment:  1. Encounter for Medicare annual wellness exam Annual medicare wellness visit today.   2. COPD  GOLD 2  Stable. Continue inhalers and respiratory medications as prescribed. Follow up with pulmonology as scheduled.   3. Generalized anxiety disorder Stable. Continue citalopram as prescribed.    Hearing/Vision screen No results found.  Depression Screen    09/20/2021    9:32 AM 09/19/2021   10:57 AM 08/14/2021   10:52 AM 07/17/2021   10:49 AM 05/29/2021   10:51 AM 09/24/2016    11:48 AM 04/26/2016    1:11 PM  PHQ 2/9 Scores  PHQ - 2 Score 1 0 '2 2 1 '$ 0 1  PHQ- 9 Score '4  6 6 4      '$ Fall Risk    09/19/2021   10:56 AM 05/29/2021   10:52 AM 04/26/2016    1:11 PM  [place] in the past year? 0 0 No  Number falls in past yr: 0 0   Injury with Fall? 0 0   Risk for fall due to : No Fall Risks    Follow up Falls evaluation completed Falls evaluation completed     [place]:  Any stairs in or around the home? Yes  If so, are there any without handrails? Yes  Home free of loose throw rugs in walkways, pet beds, electrical cords, etc? Yes  Adequate lighting in your home to reduce risk of falls? Yes   ASSISTIVE DEVICES UTILIZED TO PREVENT FALLS:  Life alert? No  Use of a cane, walker or w/c? No  Grab bars in the bathroom? No  Shower chair or bench in shower? no Elevated toilet seat or a handicapped toilet? No   TIMED UP AND GO:  Was the test performed? No .  Length of time to ambulate 10 feet:  sec.   TELEHEALTH  Cognitive Function:        09/19/2021   10:57 AM  6CIT Screen  What Year? 0 points  What month? 0 points  What time? 0 points  Count back from 20 0 points  Months in reverse 2 points  Repeat phrase 2 points  Total Score 4 points    Immunizations Immunization History  Administered Date(s) Administered   Fluad Quad(high Dose 65+) 02/21/2021   Influenza Split 01/21/2011, 02/17/2012   Influenza Whole 01/20/2009   Influenza, High Dose Seasonal PF 05/05/2015, 01/22/2016, 02/06/2016, 02/14/2017, 01/18/2019   Influenza, Seasonal, Injecte, Preservative Fre 02/15/2009, 02/19/2010, 02/12/2011, 01/29/2012, 01/12/2013, 02/02/2014   Influenza-Unspecified 02/20/1998, 02/18/2000, 02/24/2001, 02/17/2002, 04/18/2003, 02/28/2004, 02/19/2005, 01/20/2006, 02/25/2007, 01/06/2008,  02/19/2010, 12/21/2012, 02/18/2018   Pneumococcal Conjugate-13 02/02/2014   Pneumococcal Polysaccharide-23 04/23/2007, 01/06/2008,  08/14/2012, 01/27/2013, 08/20/2016   Td 10/13/1997, 04/23/2004   Tdap 09/21/2006, 09/18/2011, 08/22/2021   Tetanus 08/24/2011   Varicella 04/22/2009   Zoster Recombinat (Shingrix) 02/10/2019, 04/12/2019   Zoster, Live 02/02/2014    TDAP status: Up to date  Flu Vaccine status: Up to date  Pneumococcal vaccine status: Up to date  Covid-19 vaccine status: Declined, Education has been provided regarding the importance of this vaccine but patient still declined. Advised may receive this vaccine at local pharmacy or Health Dept.or vaccine clinic. Aware to provide a copy of the vaccination record if obtained from local pharmacy or Health Dept. Verbalized acceptance and understanding.  Qualifies for Shingles Vaccine? Yes   Zostavax completed Yes   Shingrix Completed?: Yes  Screening Tests Health Maintenance  Topic Date Due   COVID-19 Vaccine (1) Never done   INFLUENZA VACCINE  11/20/2021   COLONOSCOPY (Pts 45-35yr Insurance coverage will need to be confirmed)  01/21/2023   TETANUS/TDAP  08/23/2031   Pneumonia Vaccine 76 Years old  Completed   Hepatitis C Screening  Completed   Zoster Vaccines- Shingrix  Completed   HPV VACCINES  Aged Out    Health Maintenance  Health Maintenance Due  Topic Date Due   COVID-19 Vaccine (1) Never done    Colorectal cancer screening: Type of screening: Colonoscopy. Completed 2014. Repeat every 10 years  Lung Cancer Screening: (Low Dose CT Chest recommended if Age 76-80years, 30 pack-year currently smoking OR have quit w/in 15years.) does not qualify.   Lung Cancer Screening Referral:   Additional Screening:  Hepatitis C Screening: does qualify; ordered for patient to come in for labs  Vision Screening: Recommended annual ophthalmology exams for early detection of glaucoma and other disorders of the eye. Is the patient up to date with their annual eye exam?  Yes  Who is the provider or what is the name of the office in which the patient  attends annual eye exams? [facility]If pt is not established with a provider, would they like to be referred to a provider to establish care? No .   Dental Screening: Recommended annual dental exams for proper oral hygiene  Community Resource Referral / Chronic Care Management: CRR required this visit?  No   CCM required this visit?  No      Plan:     I have personally reviewed and noted the following in the patient's chart:   Medical and social history Use of alcohol, tobacco or illicit drugs  Current medications and supplements including opioid prescriptions. Patient is not currently taking opioid prescriptions. Functional ability and status Nutritional status Physical activity Advanced directives List of other physicians Hospitalizations, surgeries, and ER visits in previous 12 months Vitals Screenings to include cognitive, depression, and falls Referrals and appointments  In addition, I have reviewed and discussed with patient certain preventive protocols, quality metrics, and best practice recommendations. A written personalized care plan for preventive services as well as general preventive health recommendations were provided to patient.     HRonnell Freshwater NP   09/23/2021   Nurse Notes: Non face to face   Mr. Kleeman , Thank you for taking time to come for your Medicare Wellness Visit. I appreciate your ongoing commitment to your health goals. Please review the following plan we discussed and let me know if I can assist you in the future.   These are the goals  we discussed:  Goals   None     This is a list of the screening recommended for you and due dates:  Health Maintenance  Topic Date Due   COVID-19 Vaccine (1) Never done   Flu Shot  11/20/2021   Colon Cancer Screening  01/21/2023   Tetanus Vaccine  08/23/2031   Pneumonia Vaccine  Completed   Hepatitis C Screening: USPSTF Recommendation to screen - Ages 18-79 yo.  Completed   Zoster (Shingles)  Vaccine  Completed   HPV Vaccine  Aged Out

## 2021-09-20 ENCOUNTER — Encounter: Payer: Self-pay | Admitting: Nurse Practitioner

## 2021-09-20 ENCOUNTER — Ambulatory Visit (INDEPENDENT_AMBULATORY_CARE_PROVIDER_SITE_OTHER): Payer: No Typology Code available for payment source | Admitting: Nurse Practitioner

## 2021-09-20 VITALS — BP 135/77 | HR 64 | Temp 97.5°F | Ht 68.11 in | Wt 219.4 lb

## 2021-09-20 DIAGNOSIS — R972 Elevated prostate specific antigen [PSA]: Secondary | ICD-10-CM

## 2021-09-20 DIAGNOSIS — R5383 Other fatigue: Secondary | ICD-10-CM | POA: Diagnosis not present

## 2021-09-20 DIAGNOSIS — W57XXXA Bitten or stung by nonvenomous insect and other nonvenomous arthropods, initial encounter: Secondary | ICD-10-CM

## 2021-09-20 DIAGNOSIS — F411 Generalized anxiety disorder: Secondary | ICD-10-CM | POA: Diagnosis not present

## 2021-09-20 DIAGNOSIS — F409 Phobic anxiety disorder, unspecified: Secondary | ICD-10-CM

## 2021-09-20 DIAGNOSIS — F5105 Insomnia due to other mental disorder: Secondary | ICD-10-CM

## 2021-09-20 DIAGNOSIS — Z1159 Encounter for screening for other viral diseases: Secondary | ICD-10-CM

## 2021-09-20 DIAGNOSIS — Z5320 Procedure and treatment not carried out because of patient's decision for unspecified reasons: Secondary | ICD-10-CM

## 2021-09-20 MED ORDER — CITALOPRAM HYDROBROMIDE 10 MG PO TABS: 10.0000 mg | ORAL_TABLET | Freq: Every day | ORAL | 3 refills | Status: DC

## 2021-09-20 MED ORDER — TRAZODONE HCL 50 MG PO TABS: 25.0000 mg | ORAL_TABLET | Freq: Every evening | ORAL | 3 refills | Status: DC | PRN

## 2021-09-20 NOTE — Progress Notes (Signed)
Established patient visit   Patient: Arthur Harper   DOB: [birth year]   76 y.o. Male  MRN: [id number] Visit Date: 09/20/2021   No chief complaint on file.  Subjective    HPI  The patient is here for follow up of labs  -routine fasting labs were done prior to this visit.  --PSA slightly elevated at 4.4 --normal cholesterol.  --other labs essentially normal.  -concern about headache, fatigue, shortness of breath. States that he has been bitten by several ticks in recent months. Has concern for Lyme disease.  -He does have COPD. Is treated per pulmonology.  -continues to have some anxiety and depression. Currently on citalopram. Feels like this is helping to manage symptoms. Is trying to get counseling services set up through [place] system.    Medications: Outpatient Medications Prior to Visit  Medication Sig   albuterol (VENTOLIN HFA) 108 (90 Base) MCG/ACT inhaler Inhale 2 puffs into the lungs every 6 (six) hours as needed.   aspirin 81 MG chewable tablet Chew 81 mg by mouth daily.   budesonide-formoterol (SYMBICORT) 160-4.5 MCG/ACT inhaler Take 2 puffs first thing in am and then another 2 puffs about 12 hours later.   carboxymethylcellulose (REFRESH PLUS) 0.5 % SOLN INSTILL 1 DROP IN BOTH EYES FOUR TIMES A DAY AS NEEDED   cyclobenzaprine (FLEXERIL) 10 MG tablet Take 1 tablet (10 mg total) by mouth 2 (two) times daily as needed for up to 20 doses for muscle spasms.   fluticasone (FLONASE) 50 MCG/ACT nasal spray Place 1-2 sprays into both nostrils daily.   hydrocortisone 2.5 % cream APPLY SMALL AMOUNT TO AFFECTED AREA TWICE A DAY AS NEEDED -MIX WITH KETOCONAZOLE CREAM AND APPLY TO THE RED AREAS UNDER ARMPITS AND  IN THE GROIN TWICE DAILY AS NEEDED -MIX WITH KETOCONAZOLE CREAM AND APPLY TO THE RED AREAS UNDER ARMPITS AND   IN THE GROIN TWICE DAILY AS NEEDED   ketoconazole (NIZORAL) 2 % cream APPLY SMALL AMOUNT TO AFFECTED AREA TWICE A DAY -MIX WITH HYDROCORTISONE CREAM AND APPLY TO THE  RED AREAS IN THE GROIN AND  ARMPIT TWICE DAILY AS NEEDED -MIX WITH HYDROCORTISONE CREAM AND APPLY TO THE RED AREAS IN THE GROIN AND  ARMPIT TWICE DAILY AS NEEDED   Multiple Vitamin (MULTIVITAMIN) capsule Take 1 capsule by mouth daily.   terazosin (HYTRIN) 5 MG capsule Take 5 mg by mouth at bedtime.   Tiotropium Bromide Monohydrate (SPIRIVA RESPIMAT) 2.5 MCG/ACT AERS Inhale 2 puffs into the lungs daily.   valACYclovir (VALTREX) 1000 MG tablet Take 1 tablet (1,000 mg total) by mouth 2 (two) times daily.   [DISCONTINUED] citalopram (CELEXA) 10 MG tablet Take 1 tablet (10 mg total) by mouth daily.   [DISCONTINUED] traZODone (DESYREL) 50 MG tablet Take 0.5-1 tablets (25-50 mg total) by mouth at bedtime as needed for sleep.   No facility-administered medications prior to visit.    Review of Systems  Constitutional:  Positive for fatigue. Negative for activity change, chills and fever.  HENT:  Negative for congestion, postnasal drip, rhinorrhea, sinus pressure, sinus pain, sneezing and sore throat.   Eyes: Negative.   Respiratory:  Positive for shortness of breath. Negative for cough and wheezing.        History of stable COPD.  Cardiovascular:  Negative for chest pain and palpitations.  Gastrointestinal:  Negative for constipation, diarrhea, nausea and vomiting.  Endocrine: Negative for cold intolerance, heat intolerance, polydipsia and polyuria.  Genitourinary:  Negative for dysuria, frequency and urgency.  Musculoskeletal:  Negative for back pain and myalgias.  Skin:  Negative for rash.  Allergic/Immunologic: Negative for environmental allergies.  Neurological:  Negative for dizziness, weakness and headaches.  Psychiatric/Behavioral:  Positive for dysphoric mood. The patient is nervous/anxious.        Improved with low-dose citalopram and trazodone as needed at night.   Last CBC Lab Results  Component Value Date   WBC 6.0 09/07/2021   HGB 14.6 09/07/2021   HCT 41.9 09/07/2021   MCV 98  (H) 09/07/2021   MCH 34.0 (H) 09/07/2021   RDW 12.3 09/07/2021   PLT 261 00/71/2197   Last metabolic panel Lab Results  Component Value Date   GLUCOSE 104 (H) 09/07/2021   NA 138 09/07/2021   K 4.3 09/07/2021   CL 101 09/07/2021   CO2 24 09/07/2021   BUN 15 09/07/2021   CREATININE 0.92 09/07/2021   EGFR 87 09/07/2021   CALCIUM 9.4 09/07/2021   PHOS 3.6 09/05/2009   PROT 6.6 09/07/2021   ALBUMIN 4.4 09/07/2021   LABGLOB 2.2 09/07/2021   AGRATIO 2.0 09/07/2021   BILITOT 0.6 09/07/2021   ALKPHOS 82 09/07/2021   AST 20 09/07/2021   ALT 21 09/07/2021   ANIONGAP 9 05/15/2021   Last lipids Lab Results  Component Value Date   CHOL 162 09/07/2021   HDL 67 09/07/2021   LDLCALC 80 09/07/2021   TRIG 78 09/07/2021   CHOLHDL 2.4 09/07/2021   Last hemoglobin A1c Lab Results  Component Value Date   HGBA1C 5.4 09/07/2021   Last thyroid functions Lab Results  Component Value Date   TSH 3.050 09/07/2021   Lab Results  Component Value Date   PSA1 4.0 09/20/2021   PSA1 4.4 (H) 09/07/2021   PSA 2.93 08/14/2012       Objective     Today's Vitals   09/20/21 0928  BP: 135/77  Pulse: 64  Temp: (!) 97.5 F (36.4 C)  SpO2: 94%  Weight: 219 lb 6.4 oz (99.5 kg)   Body mass index is 33.36 kg/m.   BP Readings from Last 3 Encounters:  09/20/21 135/77  08/14/21 118/69  08/01/21 122/78    Wt Readings from Last 3 Encounters:  09/20/21 219 lb 6.4 oz (99.5 kg)  09/19/21 225 lb (102.1 kg)  08/14/21 221 lb 3.2 oz (100.3 kg)    Physical Exam Vitals and nursing note reviewed.  Constitutional:      Appearance: Normal appearance. He is well-developed.  HENT:     Head: Normocephalic and atraumatic.  Eyes:     Pupils: Pupils are equal, round, and reactive to light.  Cardiovascular:     Rate and Rhythm: Normal rate and regular rhythm.     Pulses: Normal pulses.     Heart sounds: Normal heart sounds.  Pulmonary:     Effort: Pulmonary effort is normal.     Breath sounds:  Normal breath sounds.  Abdominal:     Palpations: Abdomen is soft.  Musculoskeletal:        General: Normal range of motion.     Cervical back: Normal range of motion and neck supple.  Lymphadenopathy:     Cervical: No cervical adenopathy.  Skin:    General: Skin is warm and dry.     Capillary Refill: Capillary refill takes less than 2 seconds.  Neurological:     General: No focal deficit present.     Mental Status: He is alert and oriented to person, place, and time.  Psychiatric:  Mood and Affect: Mood normal.        Behavior: Behavior normal.        Thought Content: Thought content normal.        Judgment: Judgment normal.      Results for orders placed or performed in visit on 09/20/21  Hepatitis C antibody  Result Value Ref Range   Hep C Virus Ab Non Reactive Non Reactive  PSA  Result Value Ref Range   Prostate Specific Ag, Serum 4.0 0.0 - 4.0 ng/mL  Lyme Disease Serology w/Reflex  Result Value Ref Range   Lyme Total Antibody EIA Negative Negative    Assessment & Plan    1. Other fatigue Unclear etiology for increased fatigue.  We will do Lyme disease screening as patient has had multiple tick bites in recent months. - Lyme Disease Serology w/Reflex; Future - Lyme Disease Serology w/Reflex  2. Tick bite, unspecified site, initial encounter Multiple tick bites in recent months.  We will check patient for Lyme disease.  We will treat as indicated. 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  May take 1/2 to 1 tablet at bedtime as needed.  New prescription sent to his pharmacy. - traZODone (DESYREL) 50 MG tablet; Take 0.5-1 tablets (25-50 mg total) by mouth at bedtime as needed for sleep.  Dispense: 30 tablet; Refill: 3  4. Generalized anxiety disorder Well-managed with low-dose citalopram.  Continue daily. - citalopram (CELEXA) 10 MG tablet; Take 1 tablet  (10 mg total) by mouth daily.  Dispense: 30 tablet; Refill: 3  5. Elevated PSA Recent PSA was 4.4 which is slightly elevated.  Recheck with today's labs.  Refer to urology as indicated. - PSA; Future - PSA  6. Need for Hepatitis C screening test  Screen for hep C. - Hepatitis C antibody; Future - Hepatitis C antibody   Problem List Items Addressed This Visit       Musculoskeletal and Integument   Tick bite   Relevant Orders   Lyme Disease Serology w/Reflex     Other   Generalized anxiety disorder   Relevant Medications   traZODone (DESYREL) 50 MG tablet   citalopram (CELEXA) 10 MG tablet   Insomnia due to anxiety and fear   Relevant Medications   traZODone (DESYREL) 50 MG tablet   Other fatigue - Primary   Relevant Orders   Lyme Disease Serology w/Reflex   Elevated PSA   Relevant Orders   PSA   Other Visit Diagnoses     Screening for hepatitis C declined       Relevant Orders   Hepatitis C antibody (Completed)        Return in about 3 months (around 12/21/2021) for mood.         Ronnell Freshwater, NP  [facility] [phone] (phone) [phone] (fax)  [place]

## 2021-09-21 LAB — LYME DISEASE SEROLOGY W/REFLEX: Lyme Total Antibody EIA: NEGATIVE

## 2021-09-21 LAB — HEPATITIS C ANTIBODY: Hep C Virus Ab: NONREACTIVE

## 2021-09-21 LAB — PSA: Prostate Specific Ag, Serum: 4 ng/mL (ref 0.0–4.0)

## 2021-09-21 NOTE — Progress Notes (Signed)
Negative hepatitis C. Normal PSA with this check. Waiting on lyme disease screening.

## 2021-09-23 NOTE — Progress Notes (Signed)
Please let the patient know that PSA was normal. Screening for hep C and lyme disease were both negative.  Thanks so much.   -HB

## 2021-09-24 DIAGNOSIS — R972 Elevated prostate specific antigen [PSA]: Secondary | ICD-10-CM | POA: Insufficient documentation

## 2021-09-24 DIAGNOSIS — R5383 Other fatigue: Secondary | ICD-10-CM | POA: Insufficient documentation

## 2021-09-24 DIAGNOSIS — W57XXXA Bitten or stung by nonvenomous insect and other nonvenomous arthropods, initial encounter: Secondary | ICD-10-CM | POA: Insufficient documentation

## 2021-11-05 ENCOUNTER — Encounter: Payer: Self-pay | Admitting: Nurse Practitioner

## 2021-11-05 ENCOUNTER — Encounter: Payer: Self-pay | Admitting: Internal Medicine

## 2021-11-05 ENCOUNTER — Ambulatory Visit (INDEPENDENT_AMBULATORY_CARE_PROVIDER_SITE_OTHER): Payer: No Typology Code available for payment source | Admitting: Nurse Practitioner

## 2021-11-05 DIAGNOSIS — R918 Other nonspecific abnormal finding of lung field: Secondary | ICD-10-CM

## 2021-11-05 DIAGNOSIS — J302 Other seasonal allergic rhinitis: Secondary | ICD-10-CM | POA: Diagnosis not present

## 2021-11-05 DIAGNOSIS — J449 Chronic obstructive pulmonary disease, unspecified: Secondary | ICD-10-CM

## 2021-11-05 MED ORDER — BREZTRI AEROSPHERE 160-9-4.8 MCG/ACT IN AERO: 2.0000 | INHALATION_SPRAY | Freq: Two times a day (BID) | RESPIRATORY_TRACT | 0 refills | Status: DC

## 2021-11-05 NOTE — Assessment & Plan Note (Signed)
Overall, his breathing is stable; however, he still experiencing symptom burden and is limited in his activity tolerance. He also feels like symptoms are worse in evenings. We discussed trial of Breztri Twice daily in place of his Symbicort and Spiriva - provided with 2 samples. He is going to see what coverage options would be through his insurance/[facility] if he feels like the change benefits him. He has completed pulmonary rehab in the past, which was over 5 years ago. We discussed him returning - would like to see how Arthur Harper does first. Recommended graded exercises at home.  Patient Instructions  Stop Symbicort and Spiriva. Trial Breztri 2 puffs Twice daily. Brush tongue and rinse mouth afterwards. Please let me know if you notice a difference in your breathing and I can send a prescription in Continue Albuterol inhaler 2 puffs every 6 hours as needed for shortness of breath or wheezing. Notify if symptoms persist despite rescue inhaler/neb use Continue Prilosec otc '20mg'$   Take 30-60 min before first meal of the day Continue Pepcid (famotidine) 20 mg one after supper  Continue Flonase (fluticasone) nasal 1-2 sprays each nostril daily as needed for allergies  Use over the counter allergy medicine daily as needed for allergies such as Claritin or Zyrtec during allergy season   Exercise goal 150 min/week   Continue to wear CPAP nightly, minimum 4-6 hours   Follow up in 3 months with Arthur Harper or Arthur Teesha Ohm,NP. If symptoms do not improve or worsen, please contact office for sooner follow up or seek emergency care.

## 2021-11-05 NOTE — Assessment & Plan Note (Signed)
Former smoker. He is followed by lung cancer screening program at the [place]. Just had a CT recently, which he has an appointment to review results from this week. Unable to view imaging at our appointment today. Advised he notify if there are any concerning areas or changes on most recent CT.

## 2021-11-05 NOTE — Patient Instructions (Addendum)
Stop Symbicort and Spiriva. Trial Breztri 2 puffs Twice daily. Brush tongue and rinse mouth afterwards. Please let me know if you notice a difference in your breathing and I can send a prescription in Continue Albuterol inhaler 2 puffs every 6 hours as needed for shortness of breath or wheezing. Notify if symptoms persist despite rescue inhaler/neb use Continue Prilosec otc '20mg'$   Take 30-60 min before first meal of the day Continue Pepcid (famotidine) 20 mg one after supper  Continue Flonase (fluticasone) nasal 1-2 sprays each nostril daily as needed for allergies  Use over the counter allergy medicine daily as needed for allergies such as Claritin or Zyrtec during allergy season   Exercise goal 150 min/week   Continue to wear CPAP nightly, minimum 4-6 hours   Follow up in 3 months with Dr. Melvyn Novas or Katie Lathan Gieselman,NP. If symptoms do not improve or worsen, please contact office for sooner follow up or seek emergency care.

## 2021-11-05 NOTE — Assessment & Plan Note (Addendum)
Improved with addition of intranasal steroid. Continue flonase as needed. Recommended he start an OTC antihistamine during allergy season if breakthrough symptoms occur.

## 2021-11-05 NOTE — Progress Notes (Signed)
$'@Patient'y$  ID: Arthur Harper, male    DOB: [birth year], 76 y.o.   MRN: [id number]  Chief Complaint  Patient presents with   Follow-up    Follow up. Patient says he's still having difficulty breathing and he gets winded.     Referring provider: Ronnell Freshwater, NP  HPI: 76 year old male, former smoker (40 pack years) followed for COPD Gold 2, pulmonary nodule. He is a patient of Dr. Gustavus Bryant and last seen in office on 08/01/2021. Past medical history significant for GERD, GAD, PTSD, insomnia, sleep apnea on CPAP.   TEST/EVENTS:  03/22/2021 PFTs: FVC 90, FEV1 72, ratio 57, TLC 108%, DLCO corrected for alveolar volume 82.  Positive BD (13% change).  Moderate obstructive airway disease with normal diffusion capacity  06/20/2021: OV with Dr. Melvyn Novas. Slight increase in SOB with cough in evenings after supper. Added Prilosec 30 min prior to dinner and pepcid afterwards. No significant difference with singulair - advised he could stop if he didn't notice any improvement in symptoms.   08/01/2021: OV with Milas Schappell NP for follow-up.  He feels as though his cough has improved since starting on the Prilosec and Pepcid.  Also feels like his acid reflux is better controlled.  His breathing is overall stable.  He has not had any worsening shortness of breath or wheezing.  He does have some allergy type symptoms with occasional runny nose and sneezing.  Did not notice a significant difference on Singulair previously. Advised he try OTC antihistamine and flonase. He denies orthopnea, PND or lower extremity swelling.  He continues on Symbicort twice daily and Spiriva once daily.  Rare use of albuterol.  Not on any over-the-counter allergy medicines. Compliant with CPAP nightly per pt and receives good benefit - [facility] by [facility].   11/05/2021: Today - follow up Patient presents today with wife for follow up. He reports he has been about the same as he was at our last visit. Feels like his breathing is no worse but no  better. Able to complete activities around the house without difficulties. He does get winded, mainly with climbing and long distances, which is normal for him. He does feel like he gets a little more winded towards the end of the day, close to when it's time for his second dose of Symbicort. He's also curious if he could condense his medications at all. He rarely uses his albuterol, but feels like he forgets about it and maybe should use it more often. His cough is minimal and unchanged from baseline. Denies any wheezing, fevers, night sweats, anorexia, weight loss, hemoptysis, leg swelling. He did start flonase after our last visit and feels like this help his allergy/sinus symptoms. Currently not taking any antihistamine. He continues on Symbicort and Spiriva. He takes pepcid and prilosec for GERD and feels as though this is well-controlled.   No Known Allergies  Immunization History  Administered Date(s) Administered   Fluad Quad(high Dose 65+) 02/21/2021   Influenza Split 01/21/2011, 02/17/2012   Influenza Whole 01/20/2009   Influenza, High Dose Seasonal PF 05/05/2015, 01/22/2016, 02/06/2016, 02/14/2017, 01/18/2019   Influenza, Seasonal, Injecte, Preservative Fre 02/15/2009, 02/19/2010, 02/12/2011, 01/29/2012, 01/12/2013, 02/02/2014   Influenza-Unspecified 02/20/1998, 02/18/2000, 02/24/2001, 02/17/2002, 04/18/2003, 02/28/2004, 02/19/2005, 01/20/2006, 02/25/2007, 01/06/2008, 02/19/2010, 12/21/2012, 02/18/2018   Pneumococcal Conjugate-13 02/02/2014   Pneumococcal Polysaccharide-23 04/23/2007, 01/06/2008, 08/14/2012, 01/27/2013, 08/20/2016   Td 10/13/1997, 04/23/2004   Tdap 09/21/2006, 09/18/2011, 08/22/2021   Tetanus 08/24/2011   Varicella 04/22/2009   Zoster Recombinat (Shingrix) 02/10/2019, 04/12/2019  Zoster, Live 02/02/2014    Past Medical History:  Diagnosis Date   Agent orange exposure    COPD (chronic obstructive pulmonary disease) ([place])    PTSD (post-traumatic stress disorder)     Sleep apnea     Tobacco History: Social History   Tobacco Use  Smoking Status Former   Packs/day: 1.00   Years: 40.00   Total pack years: 40.00   Types: Cigarettes   Quit date: 12/31/2008   Years since quitting: 12.8  Smokeless Tobacco Never  Tobacco Comments   10 cigs per day   Counseling given: Not Answered Tobacco comments: 10 cigs per day   Outpatient Medications Prior to Visit  Medication Sig Dispense Refill   albuterol (VENTOLIN HFA) 108 (90 Base) MCG/ACT inhaler Inhale 2 puffs into the lungs every 6 (six) hours as needed. 18 g 2   aspirin 81 MG chewable tablet Chew 81 mg by mouth daily.     budesonide-formoterol (SYMBICORT) 160-4.5 MCG/ACT inhaler Take 2 puffs first thing in am and then another 2 puffs about 12 hours later. 1 each 11   carboxymethylcellulose (REFRESH PLUS) 0.5 % SOLN INSTILL 1 DROP IN BOTH EYES FOUR TIMES A DAY AS NEEDED     citalopram (CELEXA) 10 MG tablet Take 1 tablet (10 mg total) by mouth daily. 30 tablet 3   fluticasone (FLONASE) 50 MCG/ACT nasal spray Place 1-2 sprays into both nostrils daily. 16 g 2   hydrocortisone 2.5 % cream APPLY SMALL AMOUNT TO AFFECTED AREA TWICE A DAY AS NEEDED -MIX WITH KETOCONAZOLE CREAM AND APPLY TO THE RED AREAS UNDER ARMPITS AND  IN THE GROIN TWICE DAILY AS NEEDED -MIX WITH KETOCONAZOLE CREAM AND APPLY TO THE RED AREAS UNDER ARMPITS AND   IN THE GROIN TWICE DAILY AS NEEDED     ketoconazole (NIZORAL) 2 % cream APPLY SMALL AMOUNT TO AFFECTED AREA TWICE A DAY -MIX WITH HYDROCORTISONE CREAM AND APPLY TO THE RED AREAS IN THE GROIN AND  ARMPIT TWICE DAILY AS NEEDED -MIX WITH HYDROCORTISONE CREAM AND APPLY TO THE RED AREAS IN THE GROIN AND  ARMPIT TWICE DAILY AS NEEDED     Multiple Vitamin (MULTIVITAMIN) capsule Take 1 capsule by mouth daily.     terazosin (HYTRIN) 5 MG capsule Take 5 mg by mouth at bedtime.     Tiotropium Bromide Monohydrate (SPIRIVA RESPIMAT) 2.5 MCG/ACT AERS Inhale 2 puffs into the lungs daily.      traZODone (DESYREL) 50 MG tablet Take 0.5-1 tablets (25-50 mg total) by mouth at bedtime as needed for sleep. 30 tablet 3   cyclobenzaprine (FLEXERIL) 10 MG tablet Take 1 tablet (10 mg total) by mouth 2 (two) times daily as needed for up to 20 doses for muscle spasms. (Patient not taking: Reported on 11/05/2021) 20 tablet 0   valACYclovir (VALTREX) 1000 MG tablet Take 1 tablet (1,000 mg total) by mouth 2 (two) times daily. (Patient not taking: Reported on 11/05/2021) 20 tablet 0   No facility-administered medications prior to visit.     Review of Systems:   Constitutional: No weight loss or gain, night sweats, fevers, chills, fatigue, or lassitude. HEENT: No headaches, difficulty swallowing, tooth/dental problems, or sore throat. No itching, ear ache, nasal congestion, sneezing CV:  No chest pain, orthopnea, PND, swelling in lower extremities, anasarca, dizziness, palpitations, syncope Resp: +shortness of breath with exertion (unchanged); rare cough. No excess mucus or change in color of mucus. No hemoptysis. No wheezing.  No chest wall deformity GI:  No heartburn, indigestion,  abdominal pain, nausea, vomiting, diarrhea, change in bowel habits, loss of appetite, bloody stools.  Skin: No rash, lesions, ulcerations Neuro: No dizziness or lightheadedness.  Psych: No depression or anxiety. Mood stable.     Physical Exam:  BP 126/72 (BP Location: Right Arm, Patient Position: Sitting, Cuff Size: Normal)   Pulse 64   Temp 97.9 F (36.6 C) (Oral)   Ht _0  (1.727 m)   Wt 219 lb (99.3 kg)   SpO2 95%   BMI 33.30 kg/m   GEN: Pleasant, interactive, well-appearing; obese; in no acute distress. HEENT:  Normocephalic and atraumatic. PERRLA. Sclera white. Nasal turbinates pink, moist and patent bilaterally. No rhinorrhea present. Oropharynx pink and moist, without exudate or edema. No lesions, ulcerations, or postnasal drip.  NECK:  Supple w/ fair ROM. No JVD present. Normal carotid impulses w/o  bruits. Thyroid symmetrical with no goiter or nodules palpated. No lymphadenopathy.   CV: RRR, no m/r/g, no peripheral edema. Pulses intact, +2 bilaterally. No cyanosis, pallor or clubbing. PULMONARY:  Unlabored, regular breathing. Diminished bases b/l otherwise clear bilaterally A&P w/o wheezes/rales/rhonchi. No accessory muscle use. No dullness to percussion. GI: BS present and normoactive. Soft, non-tender to palpation. No organomegaly or masses detected. No CVA tenderness. Neuro: A/Ox3. No focal deficits noted.   Skin: Warm, no lesions or rashe Psych: Normal affect and behavior. Judgement and thought content appropriate.     Lab Results:  CBC    Component Value Date/Time   WBC 6.0 09/07/2021 0856   WBC 7.0 05/15/2021 2001   RBC 4.29 09/07/2021 0856   RBC 4.59 05/15/2021 2001   HGB 14.6 09/07/2021 0856   HCT 41.9 09/07/2021 0856   PLT 261 09/07/2021 0856   MCV 98 (H) 09/07/2021 0856   MCH 34.0 (H) 09/07/2021 0856   MCH 32.5 05/15/2021 2001   MCHC 34.8 09/07/2021 0856   MCHC 33.9 05/15/2021 2001   RDW 12.3 09/07/2021 0856   LYMPHSABS 1.1 09/07/2021 0856   MONOABS 0.5 05/15/2021 2001   EOSABS 0.2 09/07/2021 0856   BASOSABS 0.0 09/07/2021 0856    BMET    Component Value Date/Time   NA 138 09/07/2021 0856   K 4.3 09/07/2021 0856   CL 101 09/07/2021 0856   CO2 24 09/07/2021 0856   GLUCOSE 104 (H) 09/07/2021 0856   GLUCOSE 84 05/15/2021 2001   BUN 15 09/07/2021 0856   CREATININE 0.92 09/07/2021 0856   CALCIUM 9.4 09/07/2021 0856   GFRNONAA >60 05/15/2021 2001   GFRAA  12/19/2008 0500    >60        The eGFR has been calculated using the MDRD equation. This calculation has not been validated in all clinical situations. eGFR's persistently <60 mL/min signify possible Chronic Kidney Disease.    BNP No results found for: "BNP"   Imaging:  No results found.       Latest Ref Rng & Units 03/22/2021   10:40 AM  PFT Results  FVC-Pre L 3.07   FVC-Predicted  Pre % 83   FVC-Post L 3.30   FVC-Predicted Post % 90   Pre FEV1/FVC % % 55   Post FEV1/FCV % % 57   FEV1-Pre L 1.67   FEV1-Predicted Pre % 63   FEV1-Post L 1.90   DLCO uncorrected ml/min/mmHg 18.17   DLCO UNC% % 80   DLCO corrected ml/min/mmHg 18.54   DLCO COR %Predicted % 82   DLVA Predicted % 76   TLC L 6.90   TLC % Predicted %  108   RV % Predicted % 144     No results found for: "NITRICOXIDE"      Assessment & Plan:   COPD  GOLD 2  Overall, his breathing is stable; however, he still experiencing symptom burden and is limited in his activity tolerance. He also feels like symptoms are worse in evenings. We discussed trial of Breztri Twice daily in place of his Symbicort and Spiriva - provided with 2 samples. He is going to see what coverage options would be through his insurance/[facility] if he feels like the change benefits him. He has completed pulmonary rehab in the past, which was over 5 years ago. We discussed him returning - would like to see how Judithann Sauger does first. Recommended graded exercises at home.  Patient Instructions  Stop Symbicort and Spiriva. Trial Breztri 2 puffs Twice daily. Brush tongue and rinse mouth afterwards. Please let me know if you notice a difference in your breathing and I can send a prescription in Continue Albuterol inhaler 2 puffs every 6 hours as needed for shortness of breath or wheezing. Notify if symptoms persist despite rescue inhaler/neb use Continue Prilosec otc 78m  Take 30-60 min before first meal of the day Continue Pepcid (famotidine) 20 mg one after supper  Continue Flonase (fluticasone) nasal 1-2 sprays each nostril daily as needed for allergies  Use over the counter allergy medicine daily as needed for allergies such as Claritin or Zyrtec during allergy season   Exercise goal 150 min/week   Continue to wear CPAP nightly, minimum 4-6 hours   Follow up in 3 months with Dr. WMelvyn Novasor Katie Willia Genrich,NP. If symptoms do not improve or worsen,  please contact office for sooner follow up or seek emergency care.    Pulmonary nodules Former smoker. [place], which he has an appointment to review results from this week. Unable to view imaging at our appointment today. Advised he notify if there are any concerning areas or changes on most recent CT.   Allergic rhinitis Improved with addition of intranasal steroid. Continue flonase as needed. Recommended he start an OTC antihistamine during allergy season if breakthrough symptoms occur.    I spent 32 minutes of dedicated to the care of this patient on the date of this encounter to include pre-visit review of records, face-to-face time with the patient discussing conditions above, post visit ordering of testing, clinical documentation with the electronic health record, making appropriate referrals as documented, and communicating necessary findings to members of the patients care team.  KClayton Bibles NP 11/05/2021  Pt aware and understands NP's role.

## 2021-12-10 ENCOUNTER — Telehealth: Payer: Self-pay | Admitting: Nurse Practitioner

## 2021-12-10 DIAGNOSIS — J449 Chronic obstructive pulmonary disease, unspecified: Secondary | ICD-10-CM

## 2021-12-10 MED ORDER — BREZTRI AEROSPHERE 160-9-4.8 MCG/ACT IN AERO: 2.0000 | INHALATION_SPRAY | Freq: Two times a day (BID) | RESPIRATORY_TRACT | 6 refills | Status: DC

## 2021-12-10 NOTE — Telephone Encounter (Signed)
Call[facility] and [facility] to speak with [facility] [employer]. Will fax in rx for breztri. Nothing further needed   Called patient and updated him. Nothing further further needed

## 2021-12-13 ENCOUNTER — Telehealth: Payer: Self-pay | Admitting: Nurse Practitioner

## 2021-12-13 NOTE — Telephone Encounter (Signed)
Called and spoke with pt who states that the [place] is needing a letter documenting why pt needs to be on the [facility] inhaler instead of being on the Symbicort and the Spiriva. Pt states that the Judithann Sauger is not on the formulary list with the [place] but stated that the [place] will approve this if there is documentation that shows that pt really needs to be on the [place] instead.  Katie, please advise on this for pt. If you are okay with [place] writing a letter to the [place], please advise what we need to state in the letter.

## 2021-12-14 NOTE — Telephone Encounter (Signed)
Called and spoke with pt letting him know letter was ready. Pt stated that the letter needs to go to [facility] at fax number [phone]. Stated to pt that we would get this taken care of and he verbalized understanding. Nothing further needed.

## 2021-12-14 NOTE — Telephone Encounter (Signed)
Letter is on my desk, printed and signed.

## 2021-12-14 NOTE — Telephone Encounter (Signed)
At last OV, he was looking to condense medications and was unsure if he was getting much benefit from the Spiriva. If he feels like his breathing has improved on the [place] when compared to the Symbicort/Spiriva, I am happy to write a letter recommending continued use and stating he has received good benefit from the change/his respiratory status has improved. If he hasn't noticed much difference, then he can go back to the Symbicort/Spiriva. Thanks.

## 2021-12-14 NOTE — Telephone Encounter (Signed)
Called and spoke with pt letting him know the info per [facility]. Pt stated that the Arthur Harper has been helping his breathing and he can tell a difference in his breathing while on being on the [place]. Pt would like to have a letter written for continued use on the [place]. Routing back to [place].

## 2021-12-24 NOTE — Progress Notes (Signed)
Patient: Arthur Harper   DOB: 1945/12/25   76 y.o. Male  MRN: [id number] Visit Date: 12/25/2021  Virtual Visit via Telephone Note  I connected with Lucius Conn on 12/25/21 at  9:10 AM EDT by telephone and verified that I am speaking with the correct person using two identifiers.  Location: Patient: home Provider: [place] primary care at [facility]     I discussed the limitations, risks, security and privacy concerns of performing an evaluation and management service by telephone and the availability of in person appointments. I also discussed with the patient that there may be a patient responsible charge related to this service. The patient expressed understanding and agreed to proceed.   Chief Complaint  Patient presents with   Follow-up   Subjective    HPI  Follow up mood. -currently taking celexa 10 mg daily.  -rechecked psa which was normal at 4.0  -lyme disease profile was negative  -he states that he is now seeing a psychiatrist and therapist. Has requested a change because this is just not feeling right.  -states that he is doing ok with citalopram at current dose.  -sleeping better with trazodone. Takes only when needed. Trying to have good sleep hygiene.  -states that he recently saw pulmonology and they added Brezri to his inhalers. Feels like he is breathing better  -seeing cardiology due to intermittent chest discomfort.      Medications: Outpatient Medications Prior to Visit  Medication Sig   albuterol (VENTOLIN HFA) 108 (90 Base) MCG/ACT inhaler Inhale 2 puffs into the lungs every 6 (six) hours as needed.   aspirin 81 MG chewable tablet Chew 81 mg by mouth daily.   Budeson-Glycopyrrol-Formoterol (BREZTRI AEROSPHERE) 160-9-4.8 MCG/ACT AERO Inhale 2 puffs into the lungs in the morning and at bedtime.   budesonide-formoterol (SYMBICORT) 160-4.5 MCG/ACT inhaler Take 2 puffs first thing in am and then another 2 puffs about 12 hours later.    carboxymethylcellulose (REFRESH PLUS) 0.5 % SOLN INSTILL 1 DROP IN BOTH EYES FOUR TIMES A DAY AS NEEDED   fluticasone (FLONASE) 50 MCG/ACT nasal spray Place 1-2 sprays into both nostrils daily.   hydrocortisone 2.5 % cream APPLY SMALL AMOUNT TO AFFECTED AREA TWICE A DAY AS NEEDED -MIX WITH KETOCONAZOLE CREAM AND APPLY TO THE RED AREAS UNDER ARMPITS AND  IN THE GROIN TWICE DAILY AS NEEDED -MIX WITH KETOCONAZOLE CREAM AND APPLY TO THE RED AREAS UNDER ARMPITS AND   IN THE GROIN TWICE DAILY AS NEEDED   ketoconazole (NIZORAL) 2 % cream APPLY SMALL AMOUNT TO AFFECTED AREA TWICE A DAY -MIX WITH HYDROCORTISONE CREAM AND APPLY TO THE RED AREAS IN THE GROIN AND  ARMPIT TWICE DAILY AS NEEDED -MIX WITH HYDROCORTISONE CREAM AND APPLY TO THE RED AREAS IN THE GROIN AND  ARMPIT TWICE DAILY AS NEEDED   Multiple Vitamin (MULTIVITAMIN) capsule Take 1 capsule by mouth daily.   terazosin (HYTRIN) 5 MG capsule Take 5 mg by mouth at bedtime.   Tiotropium Bromide Monohydrate (SPIRIVA RESPIMAT) 2.5 MCG/ACT AERS Inhale 2 puffs into the lungs daily.   [DISCONTINUED] citalopram (CELEXA) 10 MG tablet Take 1 tablet (10 mg total) by mouth daily.   [DISCONTINUED] traZODone (DESYREL) 50 MG tablet Take 0.5-1 tablets (25-50 mg total) by mouth at bedtime as needed for sleep.   cyclobenzaprine (FLEXERIL) 10 MG tablet Take 1 tablet (10 mg total) by mouth 2 (two) times daily as needed for up to 20 doses for muscle spasms. (Patient not taking: Reported  on 11/05/2021)   valACYclovir (VALTREX) 1000 MG tablet Take 1 tablet (1,000 mg total) by mouth 2 (two) times daily. (Patient not taking: Reported on 11/05/2021)   No facility-administered medications prior to visit.    Review of Systems  Constitutional:  Positive for fatigue. Negative for activity change, chills and fever.  HENT:  Negative for congestion, postnasal drip, rhinorrhea, sinus pressure, sinus pain, sneezing and sore throat.   Eyes: Negative.   Respiratory:  Negative for cough,  shortness of breath and wheezing.        Improved breathing.   Cardiovascular:  Negative for chest pain and palpitations.  Gastrointestinal:  Negative for constipation, diarrhea, nausea and vomiting.  Endocrine: Negative for cold intolerance, heat intolerance, polydipsia and polyuria.  Genitourinary:  Negative for dysuria, frequency and urgency.  Musculoskeletal:  Negative for back pain and myalgias.  Skin:  Negative for rash.  Allergic/Immunologic: Negative for environmental allergies.  Neurological:  Negative for dizziness, weakness and headaches.  Psychiatric/Behavioral:  Positive for dysphoric mood and sleep disturbance. The patient is nervous/anxious.        Improved mood. Sleeping better        Objective     Today's Vitals   12/25/21 1006  Weight: 219 lb (99.3 kg)  Height: '5\' 8"'$  (1.727 m)   Body mass index is 33.3 kg/m.   BP Readings from Last 3 Encounters:  11/05/21 126/72  09/20/21 135/77  08/14/21 118/69    Wt Readings from Last 3 Encounters:  12/25/21 219 lb (99.3 kg)  11/05/21 219 lb (99.3 kg)  09/20/21 219 lb 6.4 oz (99.5 kg)      Assessment & Plan    1. Generalized anxiety disorder Improved and stable. Continue citalopram 10 mg daily.  - citalopram (CELEXA) 10 MG tablet; Take 1 tablet (10 mg total) by mouth daily.  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 Continue trazodone as prescribed.  - traZODone (DESYREL) 50 MG tablet; Take 0.5-1 tablets (25-50 mg total) by mouth at bedtime as needed for sleep.  Dispense: 30 tablet; Refill: 3  3. POST TRAUMATIC STRESS SYNDROME Now seeing psychiatry/therapist through [place].   4. COPD  GOLD 2  Continue regular visits with pulmonology as scheduled.   5. Exertional chest pain Continue visits with cardiology as scheduled.    Problem List Items Addressed This Visit       Respiratory   COPD  GOLD 2      Other   Generalized anxiety disorder - Primary   Relevant Medications    citalopram (CELEXA) 10 MG tablet   traZODone (DESYREL) 50 MG tablet   POST TRAUMATIC STRESS SYNDROME   Relevant Medications   citalopram (CELEXA) 10 MG tablet   traZODone (DESYREL) 50 MG tablet   Exertional chest pain   Insomnia due to anxiety and fear   Relevant Medications   traZODone (DESYREL) 50 MG tablet     Return in about 4 months (around 04/26/2022) for mood, sleep.      I discussed the assessment and treatment plan with the patient. The patient was provided an opportunity to ask questions and all were answered. The patient agreed with the plan and demonstrated an understanding of the instructions.   The patient was advised to call back or seek an in-person evaluation if the symptoms worsen or if the condition fails to improve as anticipated.  I provided 15 minutes of non-face-to-face time during this encounter.   Ronnell Freshwater, NPEstablished  patient visit   Ronnell Freshwater, NP  [facility] at [facility] [place] [phone] (phone) [phone] (fax)  [place]

## 2021-12-25 ENCOUNTER — Encounter: Payer: Self-pay | Admitting: Nurse Practitioner

## 2021-12-25 ENCOUNTER — Ambulatory Visit (INDEPENDENT_AMBULATORY_CARE_PROVIDER_SITE_OTHER): Payer: Medicare HMO | Admitting: Nurse Practitioner

## 2021-12-25 VITALS — Ht 68.0 in | Wt 219.0 lb

## 2021-12-25 DIAGNOSIS — F411 Generalized anxiety disorder: Secondary | ICD-10-CM | POA: Diagnosis not present

## 2021-12-25 DIAGNOSIS — J449 Chronic obstructive pulmonary disease, unspecified: Secondary | ICD-10-CM | POA: Diagnosis not present

## 2021-12-25 DIAGNOSIS — F4310 Post-traumatic stress disorder, unspecified: Secondary | ICD-10-CM

## 2021-12-25 DIAGNOSIS — F409 Phobic anxiety disorder, unspecified: Secondary | ICD-10-CM | POA: Diagnosis not present

## 2021-12-25 DIAGNOSIS — F5105 Insomnia due to other mental disorder: Secondary | ICD-10-CM

## 2021-12-25 DIAGNOSIS — R079 Chest pain, unspecified: Secondary | ICD-10-CM

## 2021-12-25 DIAGNOSIS — R69 Illness, unspecified: Secondary | ICD-10-CM | POA: Diagnosis not present

## 2021-12-25 MED ORDER — CITALOPRAM HYDROBROMIDE 10 MG PO TABS: 10.0000 mg | ORAL_TABLET | Freq: Every day | ORAL | 3 refills | Status: DC

## 2021-12-25 MED ORDER — TRAZODONE HCL 50 MG PO TABS: 25.0000 mg | ORAL_TABLET | Freq: Every evening | ORAL | 3 refills | Status: DC | PRN

## 2022-02-01 ENCOUNTER — Other Ambulatory Visit: Payer: Self-pay | Admitting: Nurse Practitioner

## 2022-02-01 DIAGNOSIS — F409 Phobic anxiety disorder, unspecified: Secondary | ICD-10-CM

## 2022-02-05 ENCOUNTER — Encounter: Payer: Self-pay | Admitting: Internal Medicine

## 2022-02-05 ENCOUNTER — Ambulatory Visit (INDEPENDENT_AMBULATORY_CARE_PROVIDER_SITE_OTHER): Payer: No Typology Code available for payment source | Admitting: Internal Medicine

## 2022-02-05 DIAGNOSIS — M542 Cervicalgia: Secondary | ICD-10-CM | POA: Diagnosis not present

## 2022-02-05 DIAGNOSIS — J449 Chronic obstructive pulmonary disease, unspecified: Secondary | ICD-10-CM

## 2022-02-05 NOTE — Patient Instructions (Addendum)
Plan A = Automatic = Always=   Breztri Take 2 puffs first thing in am and then another 2 puffs about 12 hours later.    Plan B = Backup (to supplement plan A, not to replace it) Only use your albuterol inhaler as a rescue medication to be used if you can't catch your breath by resting or doing a relaxed purse lip breathing pattern.  - The less you use it, the better it will work when you need it. - Ok to use the inhaler up to 2 puffs  every 4 hours if you must but call for appointment if use goes up over your usual need - Don't leave home without it !!  (think of it like the spare tire for your car)   Advil 600 mg three times a day with meals for up to a week   Please schedule a follow up visit in 12  months but call sooner if needed

## 2022-02-05 NOTE — Assessment & Plan Note (Signed)
Quit smoking 2010/ MM - Spirometry 07/03/20  FEV1 1.94 (59%)  Ratio 0.50 p 30% improvement from saba   - 02/08/2021   Walked on RA x  3  lap(s) =  approx 750 @ nl pace, stopped due to end of study but sob p one lap with lowest 02 sats 95%  - 02/08/2021   Try symbicort/spiriva - Labs ordered 02/08/2021  :  allergy profile  Eos 0.2/ IgE not done/ alpha one AT phenotype MM level 167  - PFT's  03/22/21 FEV1 1.9 (72 % ) ratio 0.57  p 13 % improvement from saba p symbicort/spiriva prior to study with DLCO  Nl FV curve classic concavity  02/05/2022  After extensive coaching inhaler device,  effectiveness =   90% > continue breztri   Group D (now reclassified as E) in terms of symptom/risk and laba/lama/ICS  therefore appropriate rx at this point >>>  breztri and approp saba

## 2022-02-05 NOTE — Assessment & Plan Note (Signed)
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  Total time for H and P, chart review, counseling, reviewing hfa  device(s) and generating customized AVS unique to this office visit / same day charting > 30 min summary visit

## 2022-02-05 NOTE — Progress Notes (Signed)
Arthur Harper, male    DOB: Sep 04, 1945,    MRN: [id number]   Brief patient profile:  56 yowm MM/quit smoking 2010 @ wt 180  referred to pulmonary clinic 02/08/2021 by [facility] for [facility]d [facility] with GOLD 2 criteria in 05/2010.       History of Present Illness  02/08/2021  Pulmonary/ 1st office eval/Arthur Harper  Chief Complaint  Patient presents with   Consult    Referred by Dr. Gwenette Harper from [facility]  Dyspnea:  has [place] walk [place] stops every 5-10 min no routine  Cough: tickle in throat for more than a year or two, min clear  mucus prod/ slt hoarse Sleep: cpap per Arthur Harper  SABA use: once or twice a month hfa/ no neb  Rec Plan A = Automatic = Always=   Symbicort  Take 2 puffs first thing in am and then another 2 puffs about 12 hours later and spiriva 2 puffs in am  Work on inhaler technique:    Plan B = Backup (to supplement plan A, not to replace it) Only use your albuterol inhaler as a rescue medication  Ok to try albuterol 15 min before an activity (on alternating days)  that you know would usually make you short of breath      12/1 22  NP added singulair    06/20/2021  f/u ov/Arthur Harper re: GOLD 2 COPD  maint on symbicort spiriva singulair   Chief Complaint  Patient presents with   Follow-up    Follow up. Patient says he's having trouble with shortness of breath.   Dyspnea:  walking neighborhood Cough: not in am/ tends be in evening p supper Sleeping: bed is flat / cpap SABA use: once a week 02: none  Covid status:   none / infected  Rec Ok to try albuterol 15 min before an activity (on alternating days)  that you know would usually make you short of breath  Work on inhaler technique:  GERD diet reviewed, bed blocks rec  Try prilosec otc '20mg'$   Take 30-60 min before first meal of the day and Pepcid ac (famotidine) 20 mg one after supper  Ok to try off singulair      02/05/2022  f/u ov/Arthur Harper re: GOLD 2 COPD    maint on Breztri   Chief Complaint  Patient  presents with   Follow-up    Pt states he has a a severe pain in the right backside of his head that started x 2 nights ago.  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   Sleeping as above without nocturnal  or early am exacerbation  of respiratory  c/o's or need for noct saba. Also denies any obvious fluctuation of symptoms with weather or environmental changes or other aggravating or alleviating factors except as outlined above   No unusual exposure hx or h/o childhood pna/ asthma or knowledge of premature birth.  Current Allergies, Complete Past Medical History, Past  Surgical History, Family History, and Social History were reviewed in [employer] record.  ROS  The following are not active complaints unless bolded Hoarseness, sore throat, dysphagia, dental problems, itching, sneezing,  nasal congestion or discharge of excess mucus or purulent secretions, ear ache,   fever, chills, sweats, unintended wt loss or wt gain, classically pleuritic or exertional cp,  orthopnea pnd or arm/hand swelling  or leg swelling, presyncope, palpitations, abdominal pain, anorexia, nausea, vomiting, diarrhea  or change in bowel habits or change in bladder habits, change in stools or change in urine, dysuria, hematuria,  rash, arthralgias, visual complaints, headache, numbness, weakness or ataxia or problems with walking or coordination,  change in mood or  memory. No radicular pain R UE        Current Meds  Medication Sig   albuterol  (VENTOLIN HFA) 108 (90 Base) MCG/ACT inhaler Inhale 2 puffs into the lungs every 6 (six) hours as needed.   aspirin 81 MG chewable tablet Chew 81 mg by mouth daily.   Budeson-Glycopyrrol-Formoterol (BREZTRI AEROSPHERE) 160-9-4.8 MCG/ACT AERO Inhale 2 puffs into the lungs in the morning and at bedtime.   carboxymethylcellulose (REFRESH PLUS) 0.5 % SOLN INSTILL 1 DROP IN BOTH EYES FOUR TIMES A DAY AS NEEDED   citalopram (CELEXA) 10 MG tablet Take 1 tablet (10 mg total) by mouth daily.   cyclobenzaprine (FLEXERIL) 10 MG tablet Take 1 tablet (10 mg total) by mouth 2 (two) times daily as needed for up to 20 doses for muscle spasms.   fluticasone (FLONASE) 50 MCG/ACT nasal spray Place 1-2 sprays into both nostrils daily.   hydrocortisone 2.5 % cream APPLY SMALL AMOUNT TO AFFECTED AREA TWICE A DAY AS NEEDED -MIX WITH KETOCONAZOLE CREAM AND APPLY TO THE RED AREAS UNDER ARMPITS AND  IN THE GROIN TWICE DAILY AS NEEDED -MIX WITH KETOCONAZOLE CREAM AND APPLY TO THE RED AREAS UNDER ARMPITS AND   IN THE GROIN TWICE DAILY AS NEEDED   ketoconazole (NIZORAL) 2 % cream APPLY SMALL AMOUNT TO AFFECTED AREA TWICE A DAY -MIX WITH HYDROCORTISONE CREAM AND APPLY TO THE RED AREAS IN THE GROIN AND  ARMPIT TWICE DAILY AS NEEDED -MIX WITH HYDROCORTISONE CREAM AND APPLY TO THE RED AREAS IN THE GROIN AND  ARMPIT TWICE DAILY AS NEEDED   Multiple Vitamin (MULTIVITAMIN) capsule Take 1 capsule by mouth daily.   terazosin (HYTRIN) 5 MG capsule Take 5 mg by mouth at bedtime.   traZODone (DESYREL) 50 MG tablet TAKE 1/2 TO 1 TABLET BY MOUTH AT BEDTIME AS NEEDED FOR SLEEP.   valACYclovir (VALTREX) 1000 MG tablet Take 1 tablet (1,000 mg total) by mouth 2 (two) times daily.               Past Medical History:  Diagnosis Date   Agent orange exposure    COPD (chronic obstructive pulmonary disease) ([employer] [facility])    PTSD (post-traumatic stress disorder)    Sleep apnea         Objective:    wts   02/05/2022     220   06/20/21 221 lb  (100.2 kg)  06/12/21 224 lb 6.4 oz (101.8 kg)  05/29/21 227 lb (103 kg)    Vital signs reviewed  02/05/2022  - Note at rest 02 sats  96% on RA   General appearance:    obese amb wm holding head very still    HEENT : Oropharynx  clear      NECK :  without  apparent JVD/  palpable Nodes/TM /Tender R cx paraspinals at occipital head/ no rash    LUNGS: no acc muscle use,  Min barrel  contour chest wall with bilateral  slightly decreased bs s audible wheeze and  without cough on insp or exp maneuvers and min  Hyperresonant  to  percussion bilaterally    CV:  RRR  no s3 or murmur or increase in P2, and no edema   ABD:  obese soft and nontender with pos end  insp Hoover's  in the supine position.  No bruits or organomegaly appreciated   MS:  Nl gait/ ext warm without deformities Or obvious joint restrictions  calf tenderness, cyanosis or clubbing     SKIN: warm and dry with serpiginious L > R vericose veins   NEURO:  alert, approp, nl sensorium with  no motor or cerebellar deficits apparent.              Assessment

## 2022-03-06 ENCOUNTER — Other Ambulatory Visit: Payer: Self-pay | Admitting: Nurse Practitioner

## 2022-03-06 DIAGNOSIS — F411 Generalized anxiety disorder: Secondary | ICD-10-CM

## 2022-03-19 ENCOUNTER — Encounter: Payer: Self-pay | Admitting: Nurse Practitioner

## 2022-03-19 ENCOUNTER — Ambulatory Visit (INDEPENDENT_AMBULATORY_CARE_PROVIDER_SITE_OTHER): Payer: No Typology Code available for payment source | Admitting: Nurse Practitioner

## 2022-03-19 VITALS — BP 133/81 | HR 59 | Ht 68.0 in | Wt 221.4 lb

## 2022-03-19 DIAGNOSIS — R21 Rash and other nonspecific skin eruption: Secondary | ICD-10-CM

## 2022-03-19 DIAGNOSIS — B029 Zoster without complications: Secondary | ICD-10-CM | POA: Diagnosis not present

## 2022-03-19 MED ORDER — METHYLPREDNISOLONE 4 MG PO TBPK: ORAL_TABLET | ORAL | 0 refills | Status: DC

## 2022-03-19 MED ORDER — TRIAMCINOLONE ACETONIDE 0.5 % EX CREA: 1.0000 | TOPICAL_CREAM | Freq: Three times a day (TID) | CUTANEOUS | 2 refills | Status: AC

## 2022-03-19 MED ORDER — VALACYCLOVIR HCL 1 G PO TABS: 1000.0000 mg | ORAL_TABLET | Freq: Two times a day (BID) | ORAL | 0 refills | Status: DC

## 2022-03-19 NOTE — Progress Notes (Signed)
Established patient visit   Patient: Arthur Harper   DOB: 05/10/1945   76 y.o. Male  MRN: [id number] Visit Date: 03/19/2022  Chief Complaint  Patient presents with   Rash   Subjective    Rash This is a new problem. The current episode started in the past 7 days. The problem has been gradually worsening since onset. The affected locations include the face and right eye. The rash is characterized by blistering, itchiness, pain and redness. He was exposed to nothing. Past treatments include nothing.      Medications: Outpatient Medications Prior to Visit  Medication Sig   albuterol (VENTOLIN HFA) 108 (90 Base) MCG/ACT inhaler Inhale 2 puffs into the lungs every 6 (six) hours as needed.   aspirin 81 MG chewable tablet Chew 81 mg by mouth daily.   Budeson-Glycopyrrol-Formoterol (BREZTRI AEROSPHERE) 160-9-4.8 MCG/ACT AERO Inhale 2 puffs into the lungs in the morning and at bedtime.   carboxymethylcellulose (REFRESH PLUS) 0.5 % SOLN INSTILL 1 DROP IN BOTH EYES FOUR TIMES A DAY AS NEEDED   citalopram (CELEXA) 10 MG tablet TAKE 1 TABLET (10 MG TOTAL) BY MOUTH DAILY.   cyclobenzaprine (FLEXERIL) 10 MG tablet Take 1 tablet (10 mg total) by mouth 2 (two) times daily as needed for up to 20 doses for muscle spasms.   fluticasone (FLONASE) 50 MCG/ACT nasal spray Place 1-2 sprays into both nostrils daily.   hydrocortisone 2.5 % cream APPLY SMALL AMOUNT TO AFFECTED AREA TWICE A DAY AS NEEDED -MIX WITH KETOCONAZOLE CREAM AND APPLY TO THE RED AREAS UNDER ARMPITS AND  IN THE GROIN TWICE DAILY AS NEEDED -MIX WITH KETOCONAZOLE CREAM AND APPLY TO THE RED AREAS UNDER ARMPITS AND   IN THE GROIN TWICE DAILY AS NEEDED   ketoconazole (NIZORAL) 2 % cream APPLY SMALL AMOUNT TO AFFECTED AREA TWICE A DAY -MIX WITH HYDROCORTISONE CREAM AND APPLY TO THE RED AREAS IN THE GROIN AND  ARMPIT TWICE DAILY AS NEEDED -MIX WITH HYDROCORTISONE CREAM AND APPLY TO THE RED AREAS IN THE GROIN AND  ARMPIT TWICE DAILY AS NEEDED    Multiple Vitamin (MULTIVITAMIN) capsule Take 1 capsule by mouth daily.   terazosin (HYTRIN) 5 MG capsule Take 5 mg by mouth at bedtime.   traZODone (DESYREL) 50 MG tablet TAKE 1/2 TO 1 TABLET BY MOUTH AT BEDTIME AS NEEDED FOR SLEEP.   [DISCONTINUED] valACYclovir (VALTREX) 1000 MG tablet Take 1 tablet (1,000 mg total) by mouth 2 (two) times daily.   No facility-administered medications prior to visit.    Review of Systems  Skin:  Positive for rash.     Objective     Today's Vitals   03/19/22 0936  BP: 133/81  Pulse: (Abnormal) 59  SpO2: 96%  Weight: 221 lb 6.4 oz (100.4 kg)  Height: '5\' 8"'$  (1.727 m)   Body mass index is 33.66 kg/m.   Physical Exam Vitals and nursing note reviewed.  Constitutional:      Appearance: Normal appearance. He is well-developed.  HENT:     Head: Normocephalic and atraumatic.  Eyes:     Extraocular Movements: Extraocular movements intact.     Conjunctiva/sclera: Conjunctivae normal.     Pupils: Pupils are equal, round, and reactive to light.  Cardiovascular:     Rate and Rhythm: Normal rate and regular rhythm.     Pulses: Normal pulses.     Heart sounds: Normal heart sounds.  Pulmonary:     Effort: Pulmonary effort is normal.     Breath sounds:  Normal breath sounds.  Abdominal:     Palpations: Abdomen is soft.  Musculoskeletal:        General: Normal range of motion.     Cervical back: Normal range of motion and neck supple.  Lymphadenopathy:     Cervical: No cervical adenopathy.  Skin:    General: Skin is warm and dry.     Capillary Refill: Capillary refill takes less than 2 seconds.     Findings: Rash present.     Comments: Scabbed and blistered rash on right side  of scalp and above the right eye and orbit   Neurological:     General: No focal deficit present.     Mental Status: He is alert and oriented to person, place, and time.  Psychiatric:        Mood and Affect: Mood normal.        Behavior: Behavior normal.        Thought  Content: Thought content normal.        Judgment: Judgment normal.       Assessment & Plan     1. Herpes zoster without complication Start valacyclovir 1000 mg twice daily for next 10 days. Add medrol taper. Take as directed for 6 days.  - valACYclovir (VALTREX) 1000 MG tablet; Take 1 tablet (1,000 mg total) by mouth 2 (two) times daily.  Dispense: 20 tablet; Refill: 0 - methylPREDNISolone (MEDROL) 4 MG TBPK tablet; Take by mouth as directed for 6 days  Dispense: 21 tablet; Refill: 0  2. Rash and nonspecific skin eruption Apply triamcinolone cream to effected areas twice daily as needed for itching and irritation.  - triamcinolone cream (KENALOG) 0.5 %; Apply 1 Application topically 3 (three) times daily.  Dispense: 30 g; Refill: 2   Return for prn worsening or persistent symptoms.        Ronnell Freshwater, NP  [facility] at [facility] [phone] (phone) [phone] (fax)  [place]

## 2022-03-31 DIAGNOSIS — R21 Rash and other nonspecific skin eruption: Secondary | ICD-10-CM | POA: Insufficient documentation

## 2022-04-01 ENCOUNTER — Other Ambulatory Visit: Payer: Self-pay | Admitting: Nurse Practitioner

## 2022-04-01 DIAGNOSIS — F409 Phobic anxiety disorder, unspecified: Secondary | ICD-10-CM

## 2022-04-05 NOTE — Telephone Encounter (Signed)
Note not needed 

## 2022-04-09 ENCOUNTER — Telehealth: Payer: Self-pay | Admitting: Internal Medicine

## 2022-04-09 NOTE — Telephone Encounter (Signed)
Called and left voicemail for patient to call office back to go over medication he is needing refilled.

## 2022-04-09 NOTE — Telephone Encounter (Signed)
Pt calling asking [place] to call in a refill at the [facility] in [place]. Says it is a square pill for respiratory when asked the name (Sorry). His # is [phone]

## 2022-04-10 MED ORDER — MONTELUKAST SODIUM 10 MG PO TABS: 10.0000 mg | ORAL_TABLET | Freq: Every day | ORAL | 1 refills | Status: DC

## 2022-04-10 NOTE — Telephone Encounter (Signed)
Pt called asking if he should start taking singular again. I seen this medication was discontinued in March. I asked him if he felt like he needed this for his allergies. He states he is unsure if this is something he wants to be on.   Dr. Melvyn Novas please advise

## 2022-04-10 NOTE — Telephone Encounter (Signed)
This medication is really  hard to tell whether helping or not  Can take for a month if wants to see if it helps with itching/sneezing/ wheezing/cough but has to be taken daily   If having none of those symptoms I would leave it off

## 2022-04-10 NOTE — Telephone Encounter (Signed)
Called and spoke to patient and advised him that I can send him in the medication Singular. Dr Melvyn Novas states to try for a month and to let us know how he feels. If he wants to resume it then we can send in more refills. Medication sent in. Nothing further needed

## 2022-04-16 ENCOUNTER — Ambulatory Visit: Payer: Medicare HMO | Admitting: Nurse Practitioner

## 2022-06-06 ENCOUNTER — Telehealth: Payer: Self-pay | Admitting: Nurse Practitioner

## 2022-06-06 DIAGNOSIS — F409 Phobic anxiety disorder, unspecified: Secondary | ICD-10-CM

## 2022-06-06 MED ORDER — TRAZODONE HCL 50 MG PO TABS: 25.0000 mg | ORAL_TABLET | Freq: Every evening | ORAL | 0 refills | Status: DC | PRN

## 2022-06-06 NOTE — Telephone Encounter (Signed)
Pt scheduled for first available 07/15/22

## 2022-06-06 NOTE — Telephone Encounter (Signed)
30 day sent

## 2022-06-06 NOTE — Telephone Encounter (Signed)
L.O.V: 03/19/22  N.O.V: Not scheduled  L.R.F: 04/01/22 Trazodone 30 tab 1 refill  Refill denied. OV required.

## 2022-06-26 ENCOUNTER — Encounter: Payer: Self-pay | Admitting: Gastroenterology

## 2022-07-03 ENCOUNTER — Telehealth: Payer: Self-pay | Admitting: Internal Medicine

## 2022-07-03 NOTE — Telephone Encounter (Signed)
Pt. Calling to get more refills for montelukast (SINGULAIR) 10 MG tablet  called into [facility]

## 2022-07-04 MED ORDER — MONTELUKAST SODIUM 10 MG PO TABS: 10.0000 mg | ORAL_TABLET | Freq: Every day | ORAL | 5 refills | Status: AC

## 2022-07-04 NOTE — Telephone Encounter (Signed)
Spoke with pt who states that he feels like the montelukast is helping. Will send in refill per last AVS. Nothing further needed at this time.

## 2022-07-07 IMAGING — CT CT HEAD W/O CM
3 of 4 series · 15 of 33 positions shown, 18 images · non-contrast
Comparison: None.

CLINICAL DATA: Headache, chronic, new features or increased
frequency posterior occipital headache, evaluate for mass



[Series 3: head bone · axial · 0.51mm/px · z∈[+989,+1115]mm · 7 of 83 slices shown, 9 images]
[im 10/83  soft-tissue]
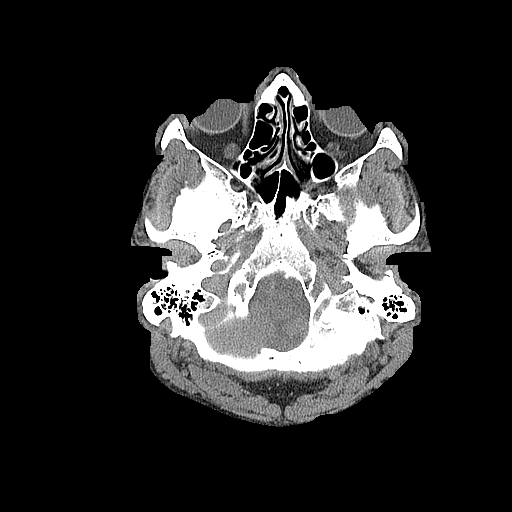
[im 10/83  bone]
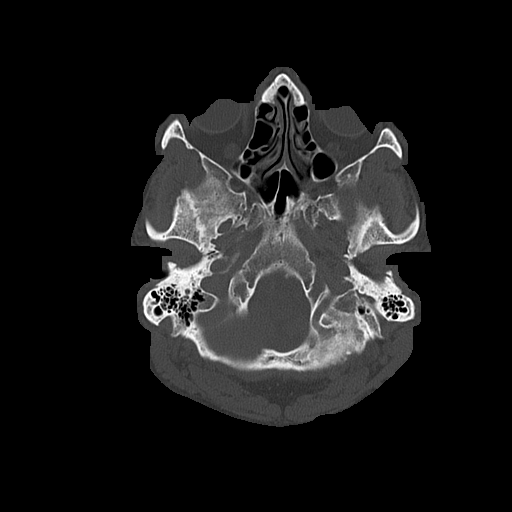
[im 19/83  bone]
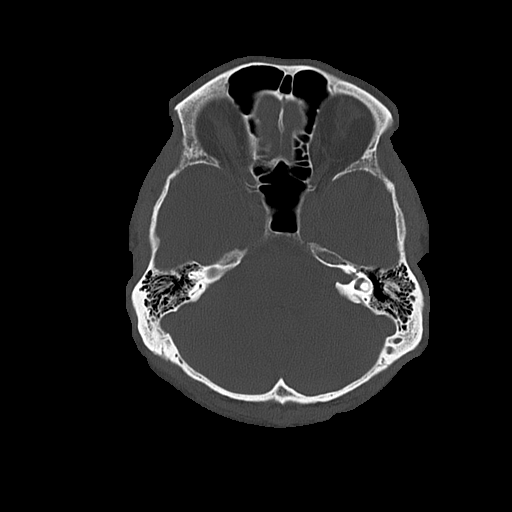
[im 28/83  bone]
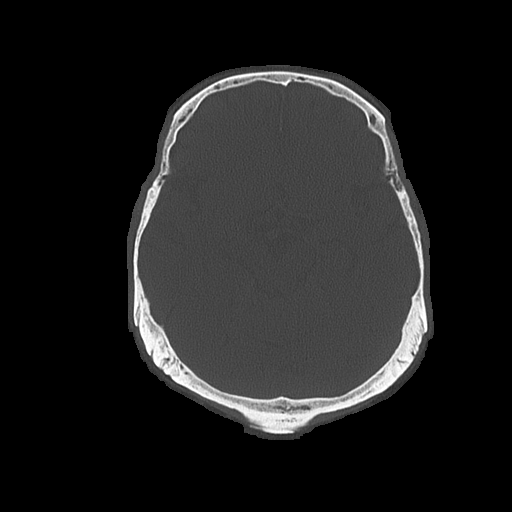
[im 46/83  bone]
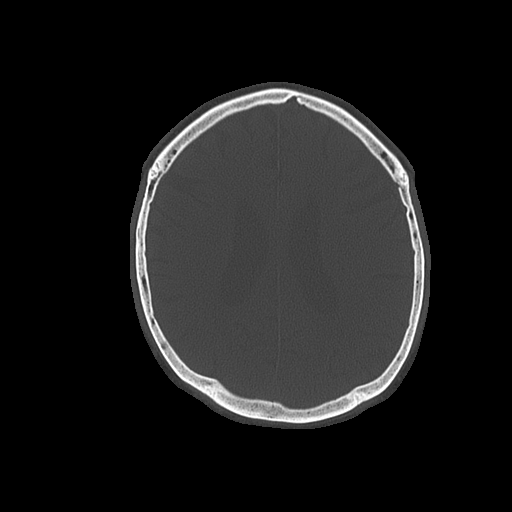
[im 55/83  soft-tissue]
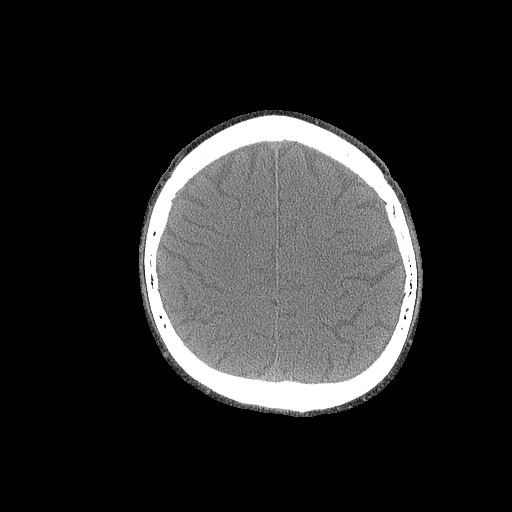
[im 55/83  bone]
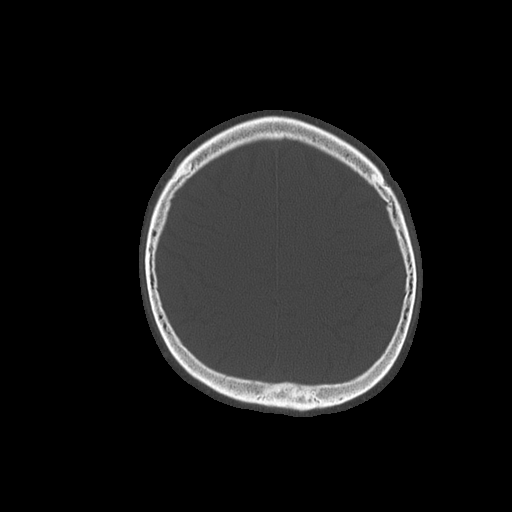
[im 64/83  bone]
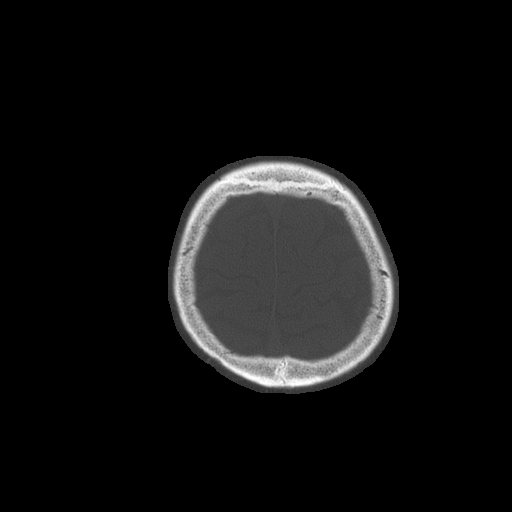
[im 73/83  bone]
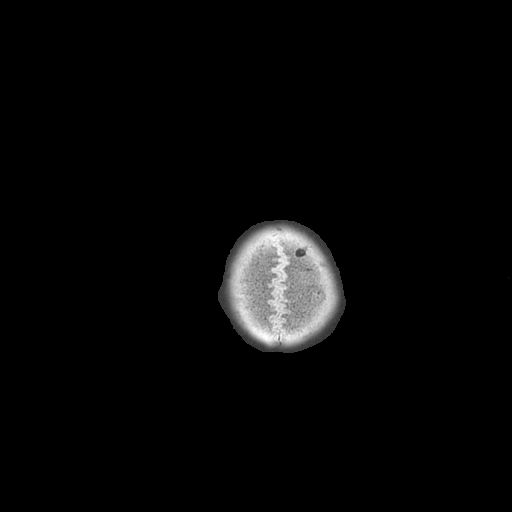

[Series 4: coronal soft · coronal · 0.33mm/px · 3 of 71 slices shown]
[im 15/71  bone]
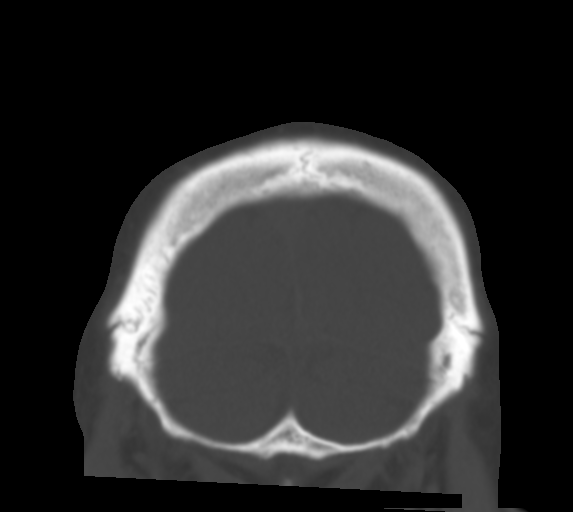
[im 29/71  bone]
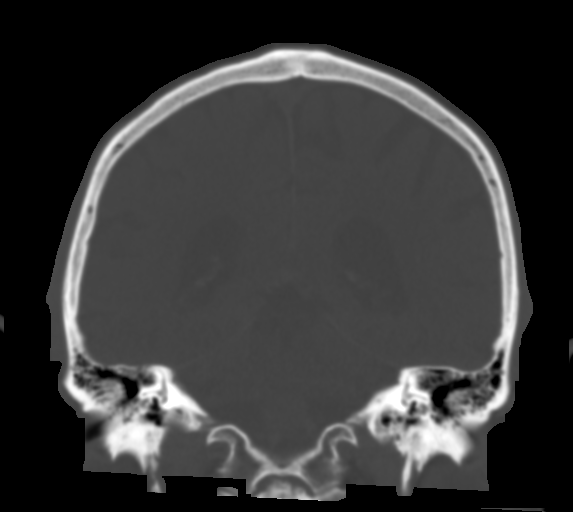
[im 43/71  bone]
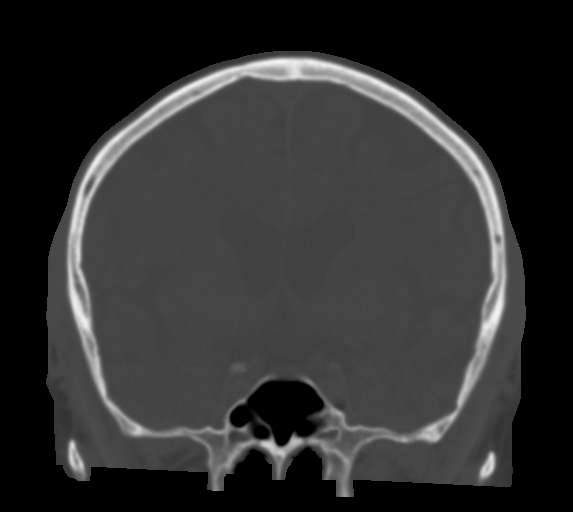

[Series 5: sagittal soft · sagittal · 0.33mm/px · 5 of 63 slices shown, 6 images]
[im 21/63  bone]
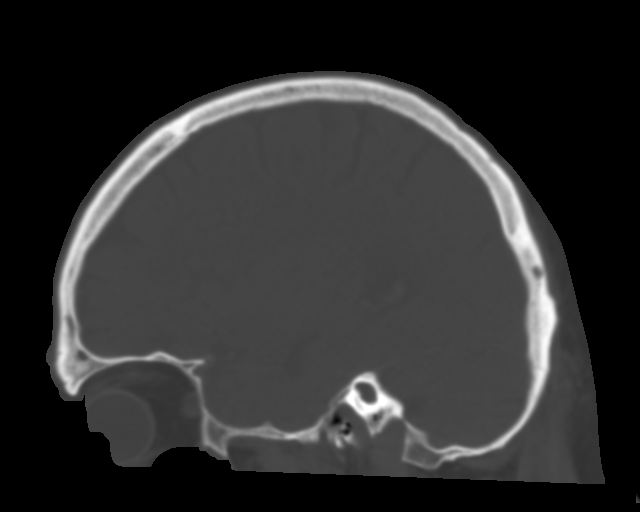
[im 26/63  bone]
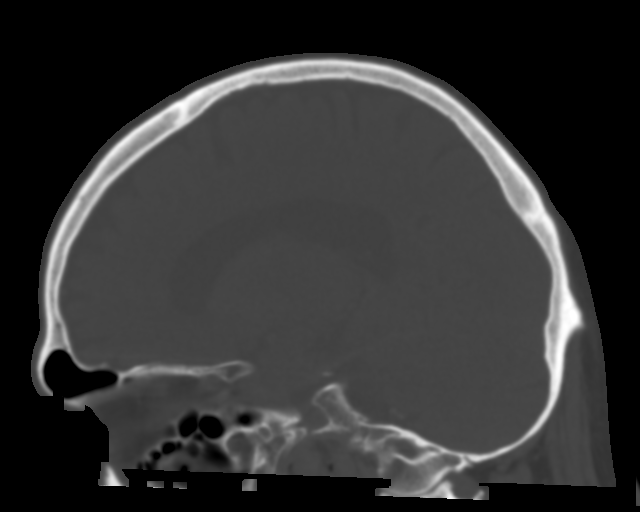
[im 32/63  soft-tissue]
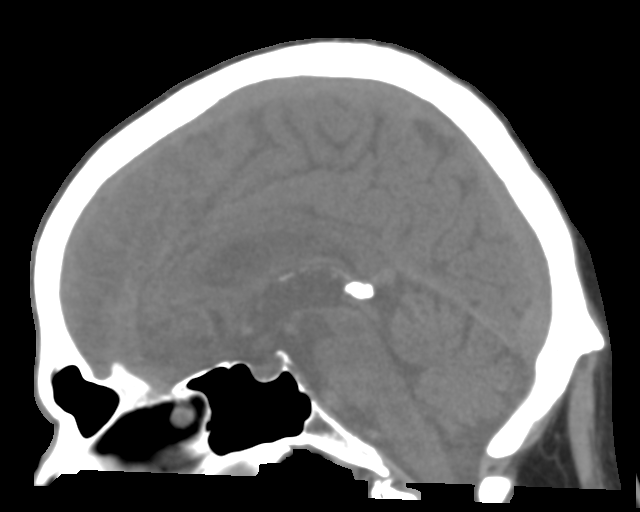
[im 32/63  bone]
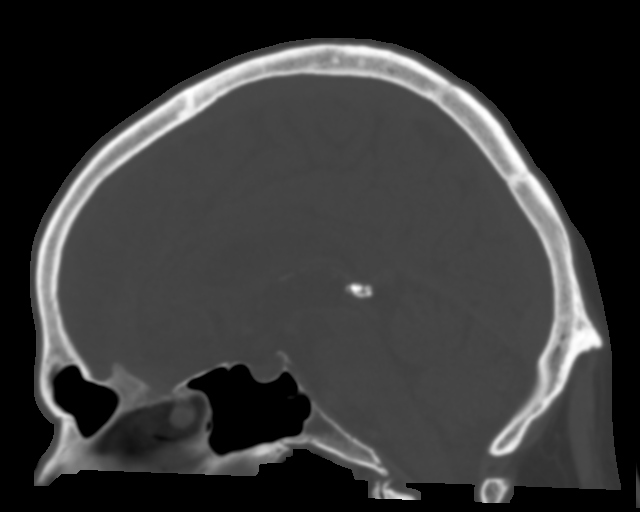
[im 37/63  bone]
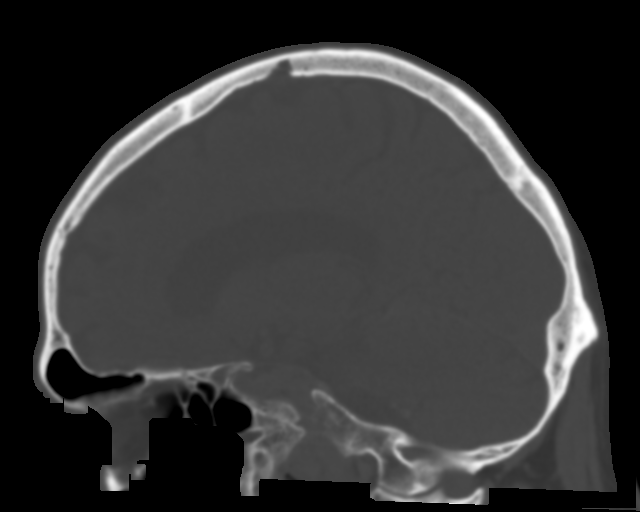
[im 42/63  bone]
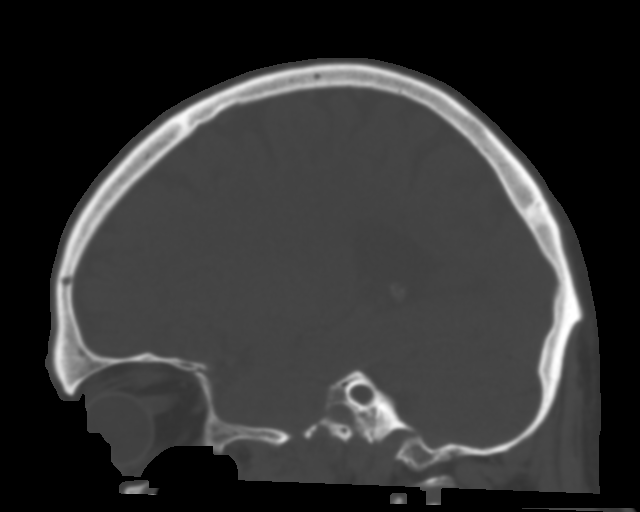

[15 of 33 positions shown; findings below may reference images not displayed]

FINDINGS: Brain: Normal brain volume for age. No intracranial hemorrhage, mass
effect, or midline shift. No hydrocephalus. The basilar cisterns are
patent. No evidence of territorial infarct or acute ischemia. No
extra-axial or intracranial fluid collection.

Vascular: No hyperdense vessel or unexpected calcification.

Skull: No fracture or focal lesion.

Sinuses/Orbits: Paranasal sinuses and mastoid air cells are clear.
No mastoid effusion. The visualized orbits are unremarkable.

Other: None.
IMPRESSION: No acute intracranial abnormality or explanation for symptoms. No
evidence of intracranial mass.

## 2022-07-09 ENCOUNTER — Other Ambulatory Visit: Payer: Self-pay | Admitting: Nurse Practitioner

## 2022-07-09 DIAGNOSIS — F411 Generalized anxiety disorder: Secondary | ICD-10-CM

## 2022-07-09 DIAGNOSIS — F409 Phobic anxiety disorder, unspecified: Secondary | ICD-10-CM

## 2022-07-15 ENCOUNTER — Encounter: Payer: Self-pay | Admitting: Nurse Practitioner

## 2022-07-15 ENCOUNTER — Ambulatory Visit (INDEPENDENT_AMBULATORY_CARE_PROVIDER_SITE_OTHER): Payer: Medicare HMO | Admitting: Nurse Practitioner

## 2022-07-15 VITALS — BP 119/70 | HR 92 | Ht 68.0 in | Wt 229.8 lb

## 2022-07-15 DIAGNOSIS — F5105 Insomnia due to other mental disorder: Secondary | ICD-10-CM

## 2022-07-15 DIAGNOSIS — F409 Phobic anxiety disorder, unspecified: Secondary | ICD-10-CM

## 2022-07-15 DIAGNOSIS — F411 Generalized anxiety disorder: Secondary | ICD-10-CM | POA: Diagnosis not present

## 2022-07-15 DIAGNOSIS — R69 Illness, unspecified: Secondary | ICD-10-CM | POA: Diagnosis not present

## 2022-07-15 DIAGNOSIS — Z87891 Personal history of nicotine dependence: Secondary | ICD-10-CM | POA: Diagnosis not present

## 2022-07-15 DIAGNOSIS — J449 Chronic obstructive pulmonary disease, unspecified: Secondary | ICD-10-CM | POA: Diagnosis not present

## 2022-07-15 DIAGNOSIS — Z122 Encounter for screening for malignant neoplasm of respiratory organs: Secondary | ICD-10-CM | POA: Diagnosis not present

## 2022-07-15 MED ORDER — CITALOPRAM HYDROBROMIDE 10 MG PO TABS: 10.0000 mg | ORAL_TABLET | Freq: Every day | ORAL | 1 refills | Status: DC

## 2022-07-15 MED ORDER — TRAZODONE HCL 50 MG PO TABS: 50.0000 mg | ORAL_TABLET | Freq: Every evening | ORAL | 1 refills | Status: DC | PRN

## 2022-07-15 NOTE — Progress Notes (Signed)
Established patient visit   Patient: Arthur Harper   DOB: 11-Oct-1945   77 y.o. 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 --has been doing well.  --has improved situation with his daughter. This has improved his anxiety levels quite a bit.  -Due for lung cancer screening.  --smoked about 1- 2 packs of cigarettes per day for about 30 years  --quit smoking 15-20 years ago  -he does see pulmonology through [place]  He denies chest pain, chest pressure, or shortness of breath. He denies headaches or visual disturbances. He denies abdominal pain, nausea, vomiting, or changes in bowel or bladder habits.     Medications: Outpatient Medications Prior to Visit  Medication Sig   albuterol (VENTOLIN HFA) 108 (90 Base) MCG/ACT inhaler Inhale 2 puffs into the lungs every 6 (six) hours as needed.   aspirin 81 MG chewable tablet Chew 81 mg by mouth daily.   Budeson-Glycopyrrol-Formoterol (BREZTRI AEROSPHERE) 160-9-4.8 MCG/ACT AERO Inhale 2 puffs into the lungs in the morning and at bedtime.   carboxymethylcellulose (REFRESH PLUS) 0.5 % SOLN INSTILL 1 DROP IN BOTH EYES FOUR TIMES A DAY AS NEEDED   cyclobenzaprine (FLEXERIL) 10 MG tablet Take 1 tablet (10 mg total) by mouth 2 (two) times daily as needed for up to 20 doses for muscle spasms.   fluticasone (FLONASE) 50 MCG/ACT nasal spray Place 1-2 sprays into both nostrils daily.   hydrocortisone 2.5 % cream APPLY SMALL AMOUNT TO AFFECTED AREA TWICE A DAY AS NEEDED -MIX WITH KETOCONAZOLE CREAM AND APPLY TO THE RED AREAS UNDER ARMPITS AND  IN THE GROIN TWICE DAILY AS NEEDED -MIX WITH KETOCONAZOLE CREAM AND APPLY TO THE RED AREAS UNDER ARMPITS AND   IN THE GROIN TWICE DAILY AS NEEDED   ketoconazole (NIZORAL) 2 % cream  APPLY SMALL AMOUNT TO AFFECTED AREA TWICE A DAY -MIX WITH HYDROCORTISONE CREAM AND APPLY TO THE RED AREAS IN THE GROIN AND  ARMPIT TWICE DAILY AS NEEDED -MIX WITH HYDROCORTISONE CREAM AND APPLY TO THE RED AREAS IN THE GROIN AND  ARMPIT TWICE DAILY AS NEEDED   methylPREDNISolone (MEDROL) 4 MG TBPK tablet Take by mouth as directed for 6 days   montelukast (SINGULAIR) 10 MG tablet Take 1 tablet (10 mg total) by mouth at bedtime.   Multiple Vitamin (MULTIVITAMIN) capsule Take 1 capsule by mouth daily.   terazosin (HYTRIN) 5 MG capsule Take 5 mg by mouth at bedtime.   triamcinolone cream (KENALOG) 0.5 % Apply 1 Application topically 3 (three) times daily.   valACYclovir (VALTREX) 1000 MG tablet Take 1 tablet (1,000 mg total) by mouth 2 (two) times daily.   [DISCONTINUED] citalopram (CELEXA) 10 MG tablet TAKE 1 TABLET (10 MG TOTAL) BY MOUTH DAILY.   [DISCONTINUED] traZODone (DESYREL) 50 MG tablet TAKE 1/2 TO 1 TABLET (25-50 MG TOTAL) BY MOUTH AT BEDTIME AS NEEDED FOR SLEEP   No facility-administered medications prior to visit.    Review of Systems See HPI     Last CBC Lab Results  Component Value Date   WBC 6.0 09/07/2021   HGB 14.6 09/07/2021   HCT 41.9 09/07/2021   MCV 98 (H) 09/07/2021   MCH 34.0 (H) 09/07/2021   RDW 12.3 09/07/2021   PLT 261 09/07/2021  Last metabolic panel Lab Results  Component Value Date   GLUCOSE 104 (H) 09/07/2021   NA 138 09/07/2021   K 4.3 09/07/2021   CL 101 09/07/2021   CO2 24 09/07/2021   BUN 15 09/07/2021   CREATININE 0.92 09/07/2021   EGFR 87 09/07/2021   CALCIUM 9.4 09/07/2021   PHOS 3.6 09/05/2009   PROT 6.6 09/07/2021   ALBUMIN 4.4 09/07/2021   LABGLOB 2.2 09/07/2021   AGRATIO 2.0 09/07/2021   BILITOT 0.6 09/07/2021   ALKPHOS 82 09/07/2021   AST 20 09/07/2021   ALT 21 09/07/2021   ANIONGAP 9 05/15/2021   Last lipids Lab Results  Component Value Date   CHOL 162 09/07/2021   HDL 67 09/07/2021   LDLCALC 80 09/07/2021   TRIG 78  09/07/2021   CHOLHDL 2.4 09/07/2021   Last hemoglobin A1c Lab Results  Component Value Date   HGBA1C 5.4 09/07/2021   Last thyroid functions Lab Results  Component Value Date   TSH 3.050 09/07/2021       Objective     Today's Vitals   07/15/22 1400  BP: 119/70  Pulse: 92  SpO2: 94%  Weight: 229 lb 12.8 oz (104.2 kg)  Height: 5\' 8"  (1.727 m)   Body mass index is 34.94 kg/m.  BP Readings from Last 3 Encounters:  07/15/22 119/70  03/19/22 133/81  02/05/22 126/70    Wt Readings from Last 3 Encounters:  07/15/22 229 lb 12.8 oz (104.2 kg)  03/19/22 221 lb 6.4 oz (100.4 kg)  02/05/22 220 lb (99.8 kg)    Physical Exam Vitals and nursing note reviewed.  Constitutional:      Appearance: Normal appearance. He is well-developed.  HENT:     Head: Normocephalic and atraumatic.     Nose: Nose normal.     Mouth/Throat:     Mouth: Mucous membranes are moist.     Pharynx: Oropharynx is clear.  Eyes:     Extraocular Movements: Extraocular movements intact.     Conjunctiva/sclera: Conjunctivae normal.     Pupils: Pupils are equal, round, and reactive to light.  Cardiovascular:     Rate and Rhythm: Normal rate and regular rhythm.     Pulses: Normal pulses.     Heart sounds: Normal heart sounds.  Pulmonary:     Effort: Pulmonary effort is normal.     Breath sounds: Normal breath sounds.  Abdominal:     Palpations: Abdomen is soft.  Musculoskeletal:        General: Normal range of motion.     Cervical back: Normal range of motion and neck supple.  Lymphadenopathy:     Cervical: No cervical adenopathy.  Skin:    General: Skin is warm and dry.     Capillary Refill: Capillary refill takes less than 2 seconds.  Neurological:     General: No focal deficit present.     Mental Status: He is alert and oriented to person, place, and time.  Psychiatric:        Mood and Affect: Mood normal.        Behavior: Behavior normal.        Thought Content: Thought content normal.         Judgment: Judgment normal.        Assessment & Plan    COPD  GOLD 2  Assessment & Plan: Currently stable. [facility] Continue inhalers and respiratory medications as prescribed.    Former smoker Assessment & Plan: History  of smoking 1- 2 packs of cigarettes per day for about 30 years  --quit smoking 15-20 years ago  --CT lung cancer screening ordered for surveillance.    Screening for lung cancer -     CT CHEST LUNG CANCER SCREENING LOW DOSE WO CONTRAST; Future  Generalized anxiety disorder Assessment & Plan: Stable. Continue citalopram as previously prescribed.   Orders: -     Citalopram Hydrobromide; Take 1 tablet (10 mg total) by mouth daily.  Dispense: 90 tablet; Refill: 1  Insomnia due to anxiety and fear Assessment & Plan: Continue trazodone as needed and as prescribed   Orders: -     traZODone HCl; Take 1 tablet (50 mg total) by mouth at bedtime as needed for sleep.  Dispense: 90 tablet; Refill: 1     Return in about 4 months (around 11/14/2022) for mood., FBW a week prior to visit, will need MWV at next available slot.         Carlean JewsHeather E Tyrique Sporn, NP  [facility] [phone]) [phone] (fax)  [facility]

## 2022-07-29 NOTE — Assessment & Plan Note (Signed)
Continue trazodone as needed and as prescribed

## 2022-07-29 NOTE — Assessment & Plan Note (Signed)
Currently stable. Followed per pulmonology at [facility]. Continue inhalers and respiratory medications as prescribed.

## 2022-07-29 NOTE — Assessment & Plan Note (Signed)
Stable. Continue citalopram as previously prescribed.

## 2022-07-29 NOTE — Assessment & Plan Note (Addendum)
History of smoking 1- 2 packs of cigarettes per day for about 30 years  --quit smoking 15-20 years ago  --CT lung cancer screening ordered for surveillance.

## 2022-08-16 ENCOUNTER — Ambulatory Visit
Admission: RE | Admit: 2022-08-16 | Discharge: 2022-08-16 | Disposition: A | Discharge status: Home / Self Care | Payer: Medicare HMO | Source: Ambulatory Visit | Attending: Nurse Practitioner | Admitting: Nurse Practitioner

## 2022-08-16 DIAGNOSIS — Z122 Encounter for screening for malignant neoplasm of respiratory organs: Secondary | ICD-10-CM

## 2022-08-16 DIAGNOSIS — Z87891 Personal history of nicotine dependence: Secondary | ICD-10-CM | POA: Diagnosis not present

## 2022-08-20 ENCOUNTER — Ambulatory Visit (INDEPENDENT_AMBULATORY_CARE_PROVIDER_SITE_OTHER): Payer: Medicare HMO | Admitting: Gastroenterology

## 2022-08-20 ENCOUNTER — Encounter: Payer: Self-pay | Admitting: Gastroenterology

## 2022-08-20 VITALS — BP 122/72 | HR 65 | Ht 68.0 in | Wt 225.8 lb

## 2022-08-20 DIAGNOSIS — K219 Gastro-esophageal reflux disease without esophagitis: Secondary | ICD-10-CM

## 2022-08-20 DIAGNOSIS — Z86010 Personal history of colonic polyps: Secondary | ICD-10-CM

## 2022-08-20 MED ORDER — NA SULFATE-K SULFATE-MG SULF 17.5-3.13-1.6 GM/177ML PO SOLN: 1.0000 | Freq: Once | ORAL | 0 refills | Status: AC

## 2022-08-20 NOTE — Patient Instructions (Addendum)
_______________________________________________________  If your blood pressure at your visit was 140/90 or greater, please contact your primary care physician to follow up on this.  _______________________________________________________  If you are age 77 or older, your body mass index should be between 23-30. Your Body mass index is 34.33 kg/m. If this is out of the aforementioned range listed, please consider follow up with your Primary Care Provider.  If you are age 23 or younger, your body mass index should be between 19-25. Your Body mass index is 34.33 kg/m. If this is out of the aformentioned range listed, please consider follow up with your Primary Care Provider.   You have been scheduled for a colonoscopy. Please follow written instructions given to you at your visit today.  Please pick up your prep supplies at the pharmacy within the next 1-3 days. If you use inhalers (even only as needed), please bring them with you on the day of your procedure.  Follow GERD handout provided.  Take Omeprazole daily.   The [facility] providers would like to encourage you to use [facility] to communicate with providers for non-urgent requests or questions.  Due to long hold times on the telephone, sending your provider a message by [facility] may be a faster and more efficient way to get a response.  Please allow 48 business hours for a response.  Please remember that this is for non-urgent requests.   It was a pleasure to see you today!  Thank you for trusting me with your gastrointestinal care!    Scott E.Cunningham,MD

## 2022-08-20 NOTE — Progress Notes (Signed)
HPI : Arthur Harper is a very pleasant 77 year old male with a history of COPD who is referred to [place] by Arthur Harper for surveillance colonoscopy and for further evaluation of chronic GERD symptoms. The patient states that he has been dealing with GERD symptoms for many years.  His symptoms include burning sensation in his throat and chest, sour/bitter acid taste as well as throat irritation.  He also has episodes of severe cough spasms, which in the past have resulted in syncope, but not recently. The symptoms bother him more often at night.  He has GERD symptoms most days of the week. No dysphagia.  No significant upper abdominal pain, nausea or vomiting. No unintentional weight loss, rather his weight has increased a little over the years.  He is prescribed omeprazole, but he only takes it as needed, and is vague as to how often he takes it, given that he has symptoms most days. He has a history of long-term smoking, but no family history of GI malignancy.  The patient underwent his first colonoscopy in 2001 in [place] by Dr. Chales Abrahams and had a 1 cm tubular adenoma removed. A surveillance colonoscopy 4 years later by Dr. Chales Abrahams was notable only for a small polyp in the ascending colon (hyperplastic versus sessile serrated adenoma). A colonoscopy in 2014 performed by Dr. Arlyce Dice was notable only for a small rectal hyperplastic polyp.  He was recommended to repeat colonoscopy in 10 years, but then underwent a colonoscopy at the [place] in 2018 which was notable only for an external hemorrhoid, but was recommended repeat in 5 years.  I do not have access to the colonoscopy performed in 2018, only see that it is mentioned in his PCPs note.  He has COPD which does cause chronic shortness of breath, and may be contributing to his cough episodes.  He has no history of hospitalizations for COPD and denies ever being treated with systemic steroids.  He has not been recommended for any home  oxygen.  ----------------------------------------------------------------------------------------------------------------------------- Previous endoscopic history  [facility] ([facility]):  External hemorrhoid; recommended repeat in 5 years (report not available for my viewing)  Colonoscopy Oct 2014 (Dr. Arlyce Dice) Rectal polyp (hyperplastic), otherwise normal   Colonoscopy 04/2003 by Dr. Lynann Bologna at [facility] in  [place], [place]. secondary to a history of adenomatous colon polyps in December  2001.  IMPRESSION: The quality of the preparation was good.  A 4mm small flat sessile proximal ascending colon polyp.  Otherwise normal colonoscopy to cecum.  PATHOLOGY: Hyperplastic polyp of the ascending colon   Colonoscopy 03/2000 by Dr. Lynann Bologna at [facility] in [place],  [place]. secondary to abnormal flexible sigmoidoscopy showing a flat colonic polyp  at 20cm.  IMPRESSION: The quality of the preparation was good. A 1.0cm sessile proximal ascending colon polyp removed with piecemeal technique. A 6mm sessile polyp 20cm from the anal verge. Small internal hemorrhoids.  PATHOLOGY: Adenomatous polyp of the ascending colon (seven fragments). Adenomatous polyp of the sigmoid colon at 20cm.     Past Medical History:  Diagnosis Date   Agent orange exposure    COPD (chronic obstructive pulmonary disease) (HCC)    PTSD (post-traumatic stress disorder)    Sleep apnea      Past Surgical History:  Procedure Laterality Date   fx ribs     TONSILLECTOMY     Family History  Problem Relation Age of Onset   Emphysema Mother    Emphysema Father    Colon cancer Neg  Hx    Social History   Tobacco Use   Smoking status: Former    Packs/day: 1.00    Years: 40.00    Additional pack years: 0.00    Total pack years: 40.00    Types: Cigarettes    Quit date: 12/31/2008    Years since quitting: 13.6   Smokeless tobacco: Never   Tobacco comments:    10 cigs per day   Vaping Use   Vaping Use: Never used  Substance Use Topics   Alcohol use: Yes    Alcohol/week: 3.0 - 4.0 standard drinks of alcohol    Types: 3 - 4 Cans of beer per week    Comment: occassional, 3 beers or glasses of wine per week   Drug use: No   Current Outpatient Medications  Medication Sig Dispense Refill   albuterol (VENTOLIN HFA) 108 (90 Base) MCG/ACT inhaler Inhale 2 puffs into the lungs every 6 (six) hours as needed. 18 g 2   ammonium lactate (LAC-HYDRIN) 12 % lotion Apply 1 Application topically as needed for dry skin.     aspirin 81 MG chewable tablet Chew 81 mg by mouth daily.     Budeson-Glycopyrrol-Formoterol (BREZTRI AEROSPHERE) 160-9-4.8 MCG/ACT AERO Inhale 2 puffs into the lungs in the morning and at bedtime. 10.7 g 6   carboxymethylcellulose (REFRESH PLUS) 0.5 % SOLN INSTILL 1 DROP IN BOTH EYES FOUR TIMES A DAY AS NEEDED     citalopram (CELEXA) 10 MG tablet Take 1 tablet (10 mg total) by mouth daily. 90 tablet 1   cyclobenzaprine (FLEXERIL) 10 MG tablet Take 1 tablet (10 mg total) by mouth 2 (two) times daily as needed for up to 20 doses for muscle spasms. 20 tablet 0   diclofenac Sodium (VOLTAREN) 1 % GEL Apply 2 g topically 4 (four) times daily.     fluticasone (FLONASE) 50 MCG/ACT nasal spray Place 1-2 sprays into both nostrils daily. 16 g 2   hydrocortisone 2.5 % cream APPLY SMALL AMOUNT TO AFFECTED AREA TWICE A DAY AS NEEDED -MIX WITH KETOCONAZOLE CREAM AND APPLY TO THE RED AREAS UNDER ARMPITS AND  IN THE GROIN TWICE DAILY AS NEEDED -MIX WITH KETOCONAZOLE CREAM AND APPLY TO THE RED AREAS UNDER ARMPITS AND   IN THE GROIN TWICE DAILY AS NEEDED     ibuprofen (ADVIL) 600 MG tablet Take 600 mg by mouth every 6 (six) hours as needed.     ketoconazole (NIZORAL) 2 % cream APPLY SMALL AMOUNT TO AFFECTED AREA TWICE A DAY -MIX WITH HYDROCORTISONE CREAM AND APPLY TO THE RED AREAS IN THE GROIN AND  ARMPIT TWICE DAILY AS NEEDED -MIX WITH HYDROCORTISONE CREAM AND APPLY TO THE RED AREAS  IN THE GROIN AND  ARMPIT TWICE DAILY AS NEEDED     latanoprost (XALATAN) 0.005 % ophthalmic solution 1 drop at bedtime.     montelukast (SINGULAIR) 10 MG tablet Take 1 tablet (10 mg total) by mouth at bedtime. 30 tablet 5   Multiple Vitamin (MULTIVITAMIN) capsule Take 1 capsule by mouth daily.     omeprazole (PRILOSEC) 40 MG capsule Take 40 mg by mouth daily.     terazosin (HYTRIN) 5 MG capsule Take 5 mg by mouth at bedtime.     traZODone (DESYREL) 50 MG tablet Take 1 tablet (50 mg total) by mouth at bedtime as needed for sleep. 90 tablet 1   triamcinolone cream (KENALOG) 0.5 % Apply 1 Application topically 3 (three) times daily. 30 g 2   No current  facility-administered medications for this visit.   No Known Allergies   Review of Systems: All systems reviewed and negative except where noted in HPI.    No results found.  Physical Exam: BP 122/72   Pulse 65   Ht 5\' 8"  (1.727 m)   Wt 225 lb 12.8 oz (102.4 kg)   BMI 34.33 kg/m  Constitutional: Pleasant,well-developed, Caucasian male in no acute distress.  Accompanied by significant other HEENT: Normocephalic and atraumatic. Conjunctivae are normal. No scleral icterus. Neck supple.  Cardiovascular: Normal rate, regular rhythm.  Pulmonary/chest: Effort normal and breath sounds normal but distant. No wheezing, rales or rhonchi. Abdominal: Soft, nondistended, nontender, centrally obese. Bowel sounds active throughout. There are no masses palpable. No hepatomegaly. Extremities: no edema Neurological: Alert and oriented to person place and time. Skin: Skin is warm and dry. No rashes noted. Psychiatric: Normal mood and affect. Behavior is normal.  CBC    Component Value Date/Time   WBC 6.0 09/07/2021 0856   WBC 7.0 05/15/2021 2001   RBC 4.29 09/07/2021 0856   RBC 4.59 05/15/2021 2001   HGB 14.6 09/07/2021 0856   HCT 41.9 09/07/2021 0856   PLT 261 09/07/2021 0856   MCV 98 (H) 09/07/2021 0856   MCH 34.0 (H) 09/07/2021 0856   MCH  32.5 05/15/2021 2001   MCHC 34.8 09/07/2021 0856   MCHC 33.9 05/15/2021 2001   RDW 12.3 09/07/2021 0856   LYMPHSABS 1.1 09/07/2021 0856   MONOABS 0.5 05/15/2021 2001   EOSABS 0.2 09/07/2021 0856   BASOSABS 0.0 09/07/2021 0856    CMP     Component Value Date/Time   NA 138 09/07/2021 0856   K 4.3 09/07/2021 0856   CL 101 09/07/2021 0856   CO2 24 09/07/2021 0856   GLUCOSE 104 (H) 09/07/2021 0856   GLUCOSE 84 05/15/2021 2001   BUN 15 09/07/2021 0856   CREATININE 0.92 09/07/2021 0856   CALCIUM 9.4 09/07/2021 0856   PROT 6.6 09/07/2021 0856   ALBUMIN 4.4 09/07/2021 0856   AST 20 09/07/2021 0856   ALT 21 09/07/2021 0856   ALKPHOS 82 09/07/2021 0856   BILITOT 0.6 09/07/2021 0856   GFRNONAA >60 05/15/2021 2001   GFRAA  12/19/2008 0500    >60        The eGFR has been calculated using the MDRD equation. This calculation has not been validated in all clinical situations. eGFR's persistently <60 mL/min signify possible Chronic Kidney Disease.     ASSESSMENT AND PLAN: 77 year old male with COPD and chronic GERD symptoms for many years, currently not well-controlled but not taking his PPI appropriately.  He does have risk factors for Barrett's (male, age, obesity, smoking history) and has never undergone an upper endoscopy.  I think a one-time EGD would be reasonable to exclude Barrett's/dysplasia. He has a history of a 1 cm adenoma and a few other smaller adenomas noted on previous colonoscopies.  His last 2 colonoscopies have been without any precancerous polyps.  We discussed how continuing colon cancer screening after age 23 should be considered on a case-by-case basis, taking into account the patient's risk factors for colon cancer, as well as his comorbidities and life expectancy.  The patient would like to proceed with 1 final colonoscopy, if it is reasonable.  Although I think the patient's risk of colon cancer is fairly low given his 2 recent negative colonoscopies, it is not  unreasonable to pursue one last colonoscopy given his history of precancerous polyps in the past, especially since  we will be sedating him for Barrett's screening EGD.  We discussed the pathophysiology of GERD and the principles of GERD management to include lifestyle modifications  such as dietary discretion (avoidance of alcohol, tobacco, caffeinated and carbonated beverages, spicy/greasy foods, citrus, peppermint/chocolate), weight loss if applicable, head of bed elevation andconsuming last meal of day within 3 hours of bedtime; pharmacologic options to include PPIs, H2RAs and OTC antacids; and finally surgical or endoscopic fundoplication. I recommended the patient start taking his omeprazole every single day, 30 to 45 minutes before breakfast.  If he is still having frequent symptoms despite daily use, I would recommend he increase this to twice daily.   GERD - EGD for Barett's screening - Take omeprazole daily, not PRN; can increase to twice daily if symptoms not adequately controlled with once daily - Behavioral/dietary modifications as above  History of colon polyps - Colonoscopy  The details, risks (including bleeding, perforation, infection, missed lesions, medication reactions and possible hospitalization or surgery if complications occur), benefits, and alternatives to EGD/colonoscopy with possible biopsy and possible polypectomy were discussed with the patient and he consents to proceed.   Mckennon Zwart E. Tomasa Rand, MD [facility]  I spent a total of 45 minutes reviewing the patient's medical record, interviewing and examining the patient, discussing her diagnosis and management of her condition going forward, and documenting in the medical record  CC: Carlean Jews, NP

## 2022-08-21 ENCOUNTER — Telehealth: Payer: Self-pay | Admitting: *Deleted

## 2022-08-21 NOTE — Telephone Encounter (Signed)
[facility] calling to be sure that you look at patients results from the CT chest lung cancer screening.

## 2022-08-27 ENCOUNTER — Telehealth: Payer: Self-pay | Admitting: Internal Medicine

## 2022-08-27 DIAGNOSIS — J449 Chronic obstructive pulmonary disease, unspecified: Secondary | ICD-10-CM

## 2022-08-27 MED ORDER — BREZTRI AEROSPHERE 160-9-4.8 MCG/ACT IN AERO
2.0000 | INHALATION_SPRAY | Freq: Two times a day (BID) | RESPIRATORY_TRACT | 6 refills | Status: DC
Start: 2022-08-27 — End: 2022-11-27

## 2022-08-27 NOTE — Telephone Encounter (Signed)
Patient states needs refill for Breztri inhaler. Pharmacy is [facility]. Patient phone number is [phone].

## 2022-08-27 NOTE — Telephone Encounter (Signed)
I have refilled his symbicort  I called the pt and left detailed msg that this was done  Nothing further needed

## 2022-09-13 ENCOUNTER — Encounter: Payer: Self-pay | Admitting: Gastroenterology

## 2022-09-26 ENCOUNTER — Encounter: Payer: Self-pay | Admitting: Gastroenterology

## 2022-09-26 ENCOUNTER — Ambulatory Visit (AMBULATORY_SURGERY_CENTER): Payer: No Typology Code available for payment source | Admitting: Gastroenterology

## 2022-09-26 VITALS — BP 108/62 | HR 58 | Temp 98.4°F | Resp 17 | Ht 68.0 in | Wt 225.0 lb

## 2022-09-26 DIAGNOSIS — D123 Benign neoplasm of transverse colon: Secondary | ICD-10-CM | POA: Diagnosis not present

## 2022-09-26 DIAGNOSIS — Z09 Encounter for follow-up examination after completed treatment for conditions other than malignant neoplasm: Secondary | ICD-10-CM

## 2022-09-26 DIAGNOSIS — D124 Benign neoplasm of descending colon: Secondary | ICD-10-CM

## 2022-09-26 DIAGNOSIS — K219 Gastro-esophageal reflux disease without esophagitis: Secondary | ICD-10-CM

## 2022-09-26 DIAGNOSIS — Z86010 Personal history of colonic polyps: Secondary | ICD-10-CM | POA: Diagnosis not present

## 2022-09-26 MED ORDER — SODIUM CHLORIDE 0.9 % IV SOLN
500.0000 mL | Freq: Once | INTRAVENOUS | Status: DC
Start: 2022-09-26 — End: 2022-09-26

## 2022-09-26 NOTE — Progress Notes (Signed)
[facility] History and Physical   Primary Care Physician:  Carlean Jews, NP   Reason for Procedure:   Colon cancer screening/polyp surveillance, GERD/Barrett's screening  Plan:    Colonoscopy/EGD     HPI: Arthur Harper is a 77 y.o. male undergoing surveillance colonoscopy and Barrett's screening EGD.  He had a 1cm polyp in 2001 (TA).   His last colonoscopy was done at the [place] in 2018 and he was recommended to repeat in 5 years (no polyps on that study). He has long standing GERD, partially controlled with PPI.  He has a smoking history.  He has not been taking any PPI recently due to absence of symptoms of late.  Past Medical History:  Diagnosis Date   Agent orange exposure    COPD (chronic obstructive pulmonary disease) (HCC)    PTSD (post-traumatic stress disorder)    Sleep apnea     Past Surgical History:  Procedure Laterality Date   COLONOSCOPY     fx ribs     TONSILLECTOMY      Prior to Admission medications   Medication Sig Start Date End Date Taking? Authorizing Provider  ammonium lactate (LAC-HYDRIN) 12 % lotion Apply 1 Application topically as needed for dry skin.   Yes [provider]  aspirin 81 MG chewable tablet Chew 81 mg by mouth daily.   Yes [provider]  Budeson-Glycopyrrol-Formoterol (BREZTRI AEROSPHERE) 160-9-4.8 MCG/ACT AERO Inhale 2 puffs into the lungs in the morning and at bedtime. 08/27/22  Yes Nyoka Cowden, MD  citalopram (CELEXA) 10 MG tablet Take 1 tablet (10 mg total) by mouth daily. 07/15/22  Yes Boscia, Kathlynn Grate, NP  diclofenac Sodium (VOLTAREN) 1 % GEL Apply 2 g topically 4 (four) times daily. 02/20/22  Yes [provider]  hydrocortisone 2.5 % cream APPLY SMALL AMOUNT TO AFFECTED AREA TWICE A DAY AS NEEDED -MIX WITH KETOCONAZOLE CREAM AND APPLY TO THE RED AREAS UNDER ARMPITS AND  IN THE GROIN TWICE DAILY AS NEEDED -MIX WITH KETOCONAZOLE CREAM AND APPLY TO THE RED AREAS UNDER ARMPITS AND   IN  THE GROIN TWICE DAILY AS NEEDED 10/05/20  Yes [provider]  ketoconazole (NIZORAL) 2 % cream APPLY SMALL AMOUNT TO AFFECTED AREA TWICE A DAY -MIX WITH HYDROCORTISONE CREAM AND APPLY TO THE RED AREAS IN THE GROIN AND  ARMPIT TWICE DAILY AS NEEDED -MIX WITH HYDROCORTISONE CREAM AND APPLY TO THE RED AREAS IN THE GROIN AND  ARMPIT TWICE DAILY AS NEEDED 10/05/20  Yes [provider]  latanoprost (XALATAN) 0.005 % ophthalmic solution 1 drop at bedtime.   Yes [provider]  montelukast (SINGULAIR) 10 MG tablet Take 1 tablet (10 mg total) by mouth at bedtime. 07/04/22  Yes Nyoka Cowden, MD  Multiple Vitamin (MULTIVITAMIN) capsule Take 1 capsule by mouth daily.   Yes [provider]  terazosin (HYTRIN) 5 MG capsule Take 5 mg by mouth at bedtime.   Yes [provider]  traZODone (DESYREL) 50 MG tablet Take 1 tablet (50 mg total) by mouth at bedtime as needed for sleep. 07/15/22  Yes Boscia, Kathlynn Grate, NP  albuterol (VENTOLIN HFA) 108 (90 Base) MCG/ACT inhaler Inhale 2 puffs into the lungs every 6 (six) hours as needed. 03/22/21   Glenford Bayley, NP  carboxymethylcellulose (REFRESH PLUS) 0.5 % SOLN INSTILL 1 DROP IN BOTH EYES FOUR TIMES A DAY AS NEEDED 10/10/20   [provider]  cetirizine (ZYRTEC) 10 MG tablet Take by mouth. 08/20/22  [provider]  cyclobenzaprine (FLEXERIL) 10 MG tablet Take 1 tablet (10 mg total) by mouth 2 (two) times daily as needed for up to 20 doses for muscle spasms. 05/15/21   Terald Sleeper, MD  fluticasone (FLONASE) 50 MCG/ACT nasal spray Place 1-2 sprays into both nostrils daily. 08/01/21   Cobb, Ruby Cola, NP  ibuprofen (ADVIL) 600 MG tablet Take 600 mg by mouth every 6 (six) hours as needed.    [provider]  omeprazole (PRILOSEC) 40 MG capsule Take 40 mg by mouth daily. 06/18/22   [provider]  triamcinolone cream (KENALOG) 0.5 % Apply 1 Application topically 3 (three) times daily.  03/19/22   Carlean Jews, NP    Current Outpatient Medications  Medication Sig Dispense Refill   ammonium lactate (LAC-HYDRIN) 12 % lotion Apply 1 Application topically as needed for dry skin.     aspirin 81 MG chewable tablet Chew 81 mg by mouth daily.     Budeson-Glycopyrrol-Formoterol (BREZTRI AEROSPHERE) 160-9-4.8 MCG/ACT AERO Inhale 2 puffs into the lungs in the morning and at bedtime. 10.7 g 6   citalopram (CELEXA) 10 MG tablet Take 1 tablet (10 mg total) by mouth daily. 90 tablet 1   diclofenac Sodium (VOLTAREN) 1 % GEL Apply 2 g topically 4 (four) times daily.     hydrocortisone 2.5 % cream APPLY SMALL AMOUNT TO AFFECTED AREA TWICE A DAY AS NEEDED -MIX WITH KETOCONAZOLE CREAM AND APPLY TO THE RED AREAS UNDER ARMPITS AND  IN THE GROIN TWICE DAILY AS NEEDED -MIX WITH KETOCONAZOLE CREAM AND APPLY TO THE RED AREAS UNDER ARMPITS AND   IN THE GROIN TWICE DAILY AS NEEDED     ketoconazole (NIZORAL) 2 % cream APPLY SMALL AMOUNT TO AFFECTED AREA TWICE A DAY -MIX WITH HYDROCORTISONE CREAM AND APPLY TO THE RED AREAS IN THE GROIN AND  ARMPIT TWICE DAILY AS NEEDED -MIX WITH HYDROCORTISONE CREAM AND APPLY TO THE RED AREAS IN THE GROIN AND  ARMPIT TWICE DAILY AS NEEDED     latanoprost (XALATAN) 0.005 % ophthalmic solution 1 drop at bedtime.     montelukast (SINGULAIR) 10 MG tablet Take 1 tablet (10 mg total) by mouth at bedtime. 30 tablet 5   Multiple Vitamin (MULTIVITAMIN) capsule Take 1 capsule by mouth daily.     terazosin (HYTRIN) 5 MG capsule Take 5 mg by mouth at bedtime.     traZODone (DESYREL) 50 MG tablet Take 1 tablet (50 mg total) by mouth at bedtime as needed for sleep. 90 tablet 1   albuterol (VENTOLIN HFA) 108 (90 Base) MCG/ACT inhaler Inhale 2 puffs into the lungs every 6 (six) hours as needed. 18 g 2   carboxymethylcellulose (REFRESH PLUS) 0.5 % SOLN INSTILL 1 DROP IN BOTH EYES FOUR TIMES A DAY AS NEEDED     cetirizine (ZYRTEC) 10 MG tablet Take by mouth.     cyclobenzaprine  (FLEXERIL) 10 MG tablet Take 1 tablet (10 mg total) by mouth 2 (two) times daily as needed for up to 20 doses for muscle spasms. 20 tablet 0   fluticasone (FLONASE) 50 MCG/ACT nasal spray Place 1-2 sprays into both nostrils daily. 16 g 2   ibuprofen (ADVIL) 600 MG tablet Take 600 mg by mouth every 6 (six) hours as needed.     omeprazole (PRILOSEC) 40 MG capsule Take 40 mg by mouth daily.     triamcinolone cream (KENALOG) 0.5 % Apply 1 Application topically 3 (three) times daily. 30 g 2  Current Facility-Administered Medications  Medication Dose Route Frequency Provider Last Rate Last Admin   0.9 %  sodium chloride infusion  500 mL Intravenous Once Jenel Lucks, MD        Allergies as of 09/26/2022   (No Known Allergies)    Family History  Problem Relation Age of Onset   Emphysema Mother    Emphysema Father    Colon cancer Neg Hx    Rectal cancer Neg Hx    Stomach cancer Neg Hx     Social History   Socioeconomic History   Marital status: Divorced    Spouse name: Not on file   Number of children: 2   Years of education: Not on file   Highest education level: Not on file  Occupational History   Occupation: retired    Comment: Aeronautical engineer  Tobacco Use   Smoking status: Former    Packs/day: 1.00    Years: 40.00    Additional pack years: 0.00    Total pack years: 40.00    Types: Cigarettes    Quit date: 12/31/2008    Years since quitting: 13.7   Smokeless tobacco: Never   Tobacco comments:    10 cigs per day  Vaping Use   Vaping Use: Never used  Substance and Sexual Activity   Alcohol use: Yes    Alcohol/week: 3.0 - 4.0 standard drinks of alcohol    Types: 3 - 4 Cans of beer per week    Comment: occassional, 3 beers or glasses of wine per week   Drug use: No   Sexual activity: Yes    Partners: Female  Other Topics Concern   Not on file  Social History Narrative   Not on file   Social Determinants of Health   Financial Resource Strain: Low Risk   (09/19/2021)   Overall Financial Resource Strain (CARDIA)    Difficulty of Paying Living Expenses: Not hard at all  Food Insecurity: No Food Insecurity (09/19/2021)   Hunger Vital Sign    Worried About Running Out of Food in the Last Year: Never true    Ran Out of Food in the Last Year: Never true  Transportation Needs: No Transportation Needs (09/19/2021)   PRAPARE - Administrator, Civil Service (Medical): No    Lack of Transportation (Non-Medical): No  Physical Activity: Insufficiently Active (09/19/2021)   Exercise Vital Sign    Days of Exercise per Week: 1 day    Minutes of Exercise per Session: 30 min  Stress: No Stress Concern Present (09/19/2021)   [employer] of Occupational Health - Occupational Stress Questionnaire    Feeling of Stress : Only a little  Social Connections: Moderately Integrated (09/19/2021)   Social Connection and Isolation Panel [NHANES]    Frequency of Communication with Friends and Family: Twice a week    Frequency of Social Gatherings with Friends and Family: Once a week    Attends Religious Services: 1 to 4 times per year    Active Member of [employer] or Organizations: Yes    Attends Banker Meetings: 1 to 4 times per year    Marital Status: Divorced  Intimate Partner Violence: Not At Risk (09/19/2021)   Humiliation, Afraid, Rape, and Kick questionnaire    Fear of Current or Ex-Partner: No    Emotionally Abused: No    Physically Abused: No    Sexually Abused: No    Review of Systems:  All other review of systems negative  except as mentioned in the HPI.  Physical Exam: Vital signs BP (!) 143/88   Pulse 61   Temp 98.4 F (36.9 C) (Temporal)   Ht 5\' 8"  (1.727 m)   Wt 225 lb (102.1 kg)   SpO2 95%   BMI 34.21 kg/m   General:   Alert,  Well-developed, well-nourished, pleasant and cooperative in NAD Airway:  Mallampati 2 Lungs:  Clear throughout to auscultation.   Heart:  Regular rate and rhythm; no murmurs, clicks,  rubs,  or gallops. Abdomen:  Soft, nontender and nondistended. Normal bowel sounds.   Neuro/Psych:  Normal mood and affect. A and O x 3   Kirk Sampley E. Tomasa Rand, MD [facility]

## 2022-09-26 NOTE — Progress Notes (Signed)
VS completed by DT.  Pt's states no medical or surgical changes since previsit or office visit.  

## 2022-09-26 NOTE — Op Note (Signed)
[facility] Patient Name: Arthur Harper Procedure Date: 09/26/2022 1:29 PM MRN: [id number] Endoscopist: Lorin Picket E. Tomasa Rand , MD, [id number] Age: 77 Referring MD:  Date of Birth: [birth year] Gender: Male Account #: [id number] Procedure:                Upper GI endoscopy Indications:              Screening for Barrett's esophagus Medicines:                Monitored Anesthesia Care Procedure:                Pre-Anesthesia Assessment:                           - Prior to the procedure, a History and Physical                            was performed, and patient medications and                            allergies were reviewed. The patient's tolerance of                            previous anesthesia was also reviewed. The risks                            and benefits of the procedure and the sedation                            options and risks were discussed with the patient.                            All questions were answered, and informed consent                            was obtained. Prior Anticoagulants: The patient has                            taken no anticoagulant or antiplatelet agents. ASA                            Grade Assessment: II - A patient with mild systemic                            disease. After reviewing the risks and benefits,                            the patient was deemed in satisfactory condition to                            undergo the procedure.                           After obtaining informed consent, the endoscope was  passed under direct vision. Throughout the                            procedure, the patient's blood pressure, pulse, and                            oxygen saturations were monitored continuously. The                            GIF [id number] #[id number] was introduced through the                            mouth, and advanced to the second part of duodenum.                            The upper GI  endoscopy was accomplished without                            difficulty. The patient tolerated the procedure                            well. Scope In: Scope Out: Findings:                 The examined portions of the nasopharynx,                            oropharynx and larynx were normal.                           The examined esophagus was normal.                           The entire examined stomach was normal.                           The examined duodenum was normal. Complications:            No immediate complications. Estimated Blood Loss:     Estimated blood loss: none. Impression:               - The examined portions of the nasopharynx,                            oropharynx and larynx were normal.                           - Normal esophagus. No evidence of Barrett's                            esophagus                           - Normal stomach.                           - Normal examined duodenum.                           -  No specimens collected. Recommendation:           - Patient has a contact number available for                            emergencies. The signs and symptoms of potential                            delayed complications were discussed with the                            patient. Return to normal activities tomorrow.                            Written discharge instructions were provided to the                            patient.                           - Resume previous diet.                           - Continue present medications.                           - No further screening for Barrett's is recommended. [name] E. Tomasa Rand, MD 09/26/2022 2:05:04 PM This report has been signed electronically.

## 2022-09-26 NOTE — Progress Notes (Signed)
Pt resting comfortably. VSS. Airway intact. SBAR complete to RN. All questions answered.   

## 2022-09-26 NOTE — Patient Instructions (Signed)
Please read handouts provided. Continue present medications. Await pathology results.    YOU HAD AN ENDOSCOPIC PROCEDURE TODAY AT THE [facility]:   Refer to the procedure report that was given to you for any specific questions about what was found during the examination.  If the procedure report does not answer your questions, please call your gastroenterologist to clarify.  If you requested that your care partner not be given the details of your procedure findings, then the procedure report has been included in a sealed envelope for you to review at your convenience later.  YOU SHOULD EXPECT: Some feelings of bloating in the abdomen. Passage of more gas than usual.  Walking can help get rid of the air that was put into your GI tract during the procedure and reduce the bloating. If you had a lower endoscopy (such as a colonoscopy or flexible sigmoidoscopy) you may notice spotting of blood in your stool or on the toilet paper. If you underwent a bowel prep for your procedure, you may not have a normal bowel movement for a few days.  Please Note:  You might notice some irritation and congestion in your nose or some drainage.  This is from the oxygen used during your procedure.  There is no need for concern and it should clear up in a day or so.  SYMPTOMS TO REPORT IMMEDIATELY:  Following lower endoscopy (colonoscopy or flexible sigmoidoscopy):  Excessive amounts of blood in the stool  Significant tenderness or worsening of abdominal pains  Swelling of the abdomen that is new, acute  Fever of 100F or higher  Following upper endoscopy (EGD)  Vomiting of blood or coffee ground material  New chest pain or pain under the shoulder blades  Painful or persistently difficult swallowing  New shortness of breath  Fever of 100F or higher  Black, tarry-looking stools  For urgent or emergent issues, a gastroenterologist can be reached at any hour by calling [phone]. Do not use  MyChart messaging for urgent concerns.    DIET:  We do recommend a small meal at first, but then you may proceed to your regular diet.  Drink plenty of fluids but you should avoid alcoholic beverages for 24 hours.  ACTIVITY:  You should plan to take it easy for the rest of today and you should NOT DRIVE or use heavy machinery until tomorrow (because of the sedation medicines used during the test).    FOLLOW UP: Our staff will call the number listed on your records the next business day following your procedure.  We will call around 7:15- 8:00 am to check on you and address any questions or concerns that you may have regarding the information given to you following your procedure. If we do not reach you, we will leave a message.     If any biopsies were taken you will be contacted by phone or by letter within the next 1-3 weeks.  Please call us at [phone] if you have not heard about the biopsies in 3 weeks.    SIGNATURES/CONFIDENTIALITY: You and/or your care partner have signed paperwork which will be entered into your electronic medical record.  These signatures attest to the fact that that the information above on your After Visit Summary has been reviewed and is understood.  Full responsibility of the confidentiality of this discharge information lies with you and/or your care-partner. 

## 2022-09-26 NOTE — Progress Notes (Signed)
Called to room to assist during endoscopic procedure.  Patient ID and intended procedure confirmed with present staff. Received instructions for my participation in the procedure from the performing physician.  

## 2022-09-26 NOTE — Op Note (Signed)
[facility] Patient Name: Rowan Bouwkamp Procedure Date: 09/26/2022 1:28 PM MRN: [id number] Endoscopist: Lorin Picket E. Tomasa Rand , MD, [id number] Age: 77 Referring MD:  Date of Birth: [birth year] Gender: Male Account #: [id number] Procedure:                Colonoscopy Indications:              Surveillance: Personal history of adenomatous                            polyps on last colonoscopy > 5 years ago Medicines:                Monitored Anesthesia Care Procedure:                Pre-Anesthesia Assessment:                           - Prior to the procedure, a History and Physical                            was performed, and patient medications and                            allergies were reviewed. The patient's tolerance of                            previous anesthesia was also reviewed. The risks                            and benefits of the procedure and the sedation                            options and risks were discussed with the patient.                            All questions were answered, and informed consent                            was obtained. Prior Anticoagulants: The patient has                            taken no anticoagulant or antiplatelet agents. ASA                            Grade Assessment: II - A patient with mild systemic                            disease. After reviewing the risks and benefits,                            the patient was deemed in satisfactory condition to                            undergo the procedure.  After obtaining informed consent, the colonoscope                            was passed under direct vision. Throughout the                            procedure, the patient's blood pressure, pulse, and                            oxygen saturations were monitored continuously. The                            Olympus SN [id number] was introduced through the anus                            and advanced  to the the terminal ileum, with                            identification of the appendiceal orifice and IC                            valve. The colonoscopy was performed without                            difficulty. The patient tolerated the procedure                            well. The quality of the bowel preparation was                            adequate. The terminal ileum, ileocecal valve,                            appendiceal orifice, and rectum were photographed.                            The bowel preparation used was SUPREP via split                            dose instruction. Scope In: 1:41:18 PM Scope Out: 1:59:52 PM Scope Withdrawal Time: 0 hours 10 minutes 5 seconds  Total Procedure Duration: 0 hours 18 minutes 34 seconds  Findings:                 The perianal and digital rectal examinations were                            normal. Pertinent negatives include normal                            sphincter tone and no palpable rectal lesions.                           A 6 mm polyp was found in the transverse colon. The  polyp was sessile. The polyp was removed with a                            cold snare. Resection and retrieval were complete.                            Estimated blood loss was minimal.                           A 3 mm polyp was found in the ascending colon. The                            polyp was sessile. The polyp was removed with a                            cold snare. Resection and retrieval were complete.                            Estimated blood loss was minimal.                           The exam was otherwise normal throughout the                            examined colon.                           The terminal ileum appeared normal.                           The retroflexed view of the distal rectum and anal                            verge was normal and showed no anal or rectal                             abnormalities. Complications:            No immediate complications. Estimated Blood Loss:     Estimated blood loss was minimal. Impression:               - One 6 mm polyp in the transverse colon, removed                            with a cold snare. Resected and retrieved.                           - One 3 mm polyp in the ascending colon, removed                            with a cold snare. Resected and retrieved.                           - The examined portion of the ileum was normal.                           -  The distal rectum and anal verge are normal on                            retroflexion view. Recommendation:           - Patient has a contact number available for                            emergencies. The signs and symptoms of potential                            delayed complications were discussed with the                            patient. Return to normal activities tomorrow.                            Written discharge instructions were provided to the                            patient.                           - Resume previous diet.                           - Continue present medications.                           - Await pathology results.                           - Given patient's age and lack of high risk polyps,                            I recommend against further colon cancer screening. Jemmie Rhinehart E. Tomasa Rand, MD 09/26/2022 2:12:06 PM This report has been signed electronically.

## 2022-09-27 ENCOUNTER — Telehealth: Payer: Self-pay

## 2022-09-27 NOTE — Telephone Encounter (Signed)
  Follow up Call-     09/26/2022   12:42 PM  Call back number  Post procedure Call Back phone  # [phone]  Permission to leave phone message Yes     Patient questions:  Do you have a fever, pain , or abdominal swelling? No. Pain Score  0 *  Have you tolerated food without any problems? Yes.    Have you been able to return to your normal activities? Yes.    Do you have any questions about your discharge instructions: Diet   No. Medications  No. Follow up visit  No.  Do you have questions or concerns about your Care? No.  Actions: * If pain score is 4 or above: No action needed, pain <4.

## 2022-10-07 ENCOUNTER — Encounter: Payer: Self-pay | Admitting: Gastroenterology

## 2022-10-15 ENCOUNTER — Encounter: Payer: Self-pay | Admitting: Nurse Practitioner

## 2022-10-15 ENCOUNTER — Ambulatory Visit (INDEPENDENT_AMBULATORY_CARE_PROVIDER_SITE_OTHER): Payer: Medicare HMO | Admitting: Nurse Practitioner

## 2022-10-15 VITALS — BP 112/63 | HR 83 | Ht 68.0 in | Wt 219.8 lb

## 2022-10-15 DIAGNOSIS — I2583 Coronary atherosclerosis due to lipid rich plaque: Secondary | ICD-10-CM

## 2022-10-15 DIAGNOSIS — I2510 Atherosclerotic heart disease of native coronary artery without angina pectoris: Secondary | ICD-10-CM | POA: Diagnosis not present

## 2022-10-15 DIAGNOSIS — F409 Phobic anxiety disorder, unspecified: Secondary | ICD-10-CM

## 2022-10-15 DIAGNOSIS — Z9189 Other specified personal risk factors, not elsewhere classified: Secondary | ICD-10-CM

## 2022-10-15 DIAGNOSIS — I700 Atherosclerosis of aorta: Secondary | ICD-10-CM

## 2022-10-15 DIAGNOSIS — F411 Generalized anxiety disorder: Secondary | ICD-10-CM

## 2022-10-15 DIAGNOSIS — R911 Solitary pulmonary nodule: Secondary | ICD-10-CM | POA: Diagnosis not present

## 2022-10-15 DIAGNOSIS — F5105 Insomnia due to other mental disorder: Secondary | ICD-10-CM

## 2022-10-15 DIAGNOSIS — R0609 Other forms of dyspnea: Secondary | ICD-10-CM

## 2022-10-15 MED ORDER — CITALOPRAM HYDROBROMIDE 10 MG PO TABS
10.0000 mg | ORAL_TABLET | Freq: Every day | ORAL | 1 refills | Status: DC
Start: 2022-10-15 — End: 2023-04-14

## 2022-10-15 MED ORDER — TRAZODONE HCL 50 MG PO TABS
50.0000 mg | ORAL_TABLET | Freq: Every evening | ORAL | 1 refills | Status: DC | PRN
Start: 2022-10-15 — End: 2023-04-14

## 2022-10-15 NOTE — Progress Notes (Signed)
Established patient visit   Patient: Arthur Harper   DOB: 1946-04-14   77 y.o. 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 --has been doing well. -COPD - does see pulmonology --prior smoker  --CT lung cancer screening done 07/2022  --1. Lung-RADS 4A, suspicious. Follow up low-dose chest CT without contrast in 3 months (please use the following order, "CT CHEST LCS NODULE FOLLOW-UP W/O CM") is recommended. Alternatively, PET may be considered when there is a solid component 8mm or larger. 2. Coronary artery calcifications. 3. Aortic Atherosclerosis (ICD10-I70.0) and Emphysema (ICD10-J43.9). -will need to have new CT chest ordered for nodule follow up   Increased fatigue -shortness of breath with exertion.  -did have exercise stress test within the past year at the [place] and was unable to pass the test. No follow up was ever pursued.  -CAD and aortic atherosclerosis were both noted on CT chest.     Medications: Outpatient Medications Prior to Visit  Medication Sig   albuterol (VENTOLIN HFA) 108 (90 Base) MCG/ACT inhaler Inhale 2 puffs into the lungs every 6 (six) hours as needed.   ammonium lactate (LAC-HYDRIN) 12 % lotion Apply 1 Application topically as needed for dry skin.   aspirin 81 MG chewable tablet Chew 81 mg by mouth daily.   Budeson-Glycopyrrol-Formoterol (BREZTRI AEROSPHERE) 160-9-4.8 MCG/ACT AERO Inhale 2 puffs into the lungs in the morning and at bedtime.   carboxymethylcellulose (REFRESH PLUS) 0.5 % SOLN INSTILL 1 DROP IN BOTH EYES FOUR TIMES A DAY AS NEEDED   cetirizine (ZYRTEC) 10 MG tablet Take by mouth.   cyclobenzaprine (FLEXERIL) 10 MG tablet Take 1 tablet (10 mg total) by mouth 2 (two) times daily as needed for up to 20  doses for muscle spasms.   diclofenac Sodium (VOLTAREN) 1 % GEL Apply 2 g topically 4 (four) times daily.   fluticasone (FLONASE) 50 MCG/ACT nasal spray Place 1-2 sprays into both nostrils daily.   hydrocortisone 2.5 % cream APPLY SMALL AMOUNT TO AFFECTED AREA TWICE A DAY AS NEEDED -MIX WITH KETOCONAZOLE CREAM AND APPLY TO THE RED AREAS UNDER ARMPITS AND  IN THE GROIN TWICE DAILY AS NEEDED -MIX WITH KETOCONAZOLE CREAM AND APPLY TO THE RED AREAS UNDER ARMPITS AND   IN THE GROIN TWICE DAILY AS NEEDED   ibuprofen (ADVIL) 600 MG tablet Take 600 mg by mouth every 6 (six) hours as needed.   ketoconazole (NIZORAL) 2 % cream APPLY SMALL AMOUNT TO AFFECTED AREA TWICE A DAY -MIX WITH HYDROCORTISONE CREAM AND APPLY TO THE RED AREAS IN THE GROIN AND  ARMPIT TWICE DAILY AS NEEDED -MIX WITH HYDROCORTISONE CREAM AND APPLY TO THE RED AREAS IN THE GROIN AND  ARMPIT TWICE DAILY AS NEEDED   latanoprost (XALATAN) 0.005 % ophthalmic solution 1 drop at bedtime.   montelukast (SINGULAIR) 10 MG tablet Take 1 tablet (10 mg total) by mouth at bedtime.   Multiple Vitamin (MULTIVITAMIN) capsule Take 1 capsule by mouth daily.   omeprazole (PRILOSEC) 40 MG capsule Take 40 mg by mouth daily.   terazosin (HYTRIN) 5 MG capsule Take 5 mg by mouth at bedtime.   triamcinolone cream (KENALOG) 0.5 % Apply 1 Application topically 3 (three) times daily.   [DISCONTINUED] citalopram (CELEXA) 10 MG tablet Take  1 tablet (10 mg total) by mouth daily.   [DISCONTINUED] traZODone (DESYREL) 50 MG tablet Take 1 tablet (50 mg total) by mouth at bedtime as needed for sleep.   No facility-administered medications prior to visit.    Review of Systems See HPI      Objective     Today's Vitals   10/15/22 1334  BP: 112/63  Pulse: 83  SpO2: 94%  Weight: 219 lb 12.8 oz (99.7 kg)  Height: 5\' 8"  (1.727 m)   Body mass index is 33.42 kg/m.  BP Readings from Last 3 Encounters:  10/15/22 112/63  09/26/22 108/62  08/20/22 122/72    Wt  Readings from Last 3 Encounters:  10/15/22 219 lb 12.8 oz (99.7 kg)  09/26/22 225 lb (102.1 kg)  08/20/22 225 lb 12.8 oz (102.4 kg)    Physical Exam Vitals and nursing note reviewed.  Constitutional:      Appearance: Normal appearance. He is well-developed.  HENT:     Head: Normocephalic and atraumatic.     Nose: Nose normal.     Mouth/Throat:     Mouth: Mucous membranes are moist.     Pharynx: Oropharynx is clear.  Eyes:     Extraocular Movements: Extraocular movements intact.     Conjunctiva/sclera: Conjunctivae normal.     Pupils: Pupils are equal, round, and reactive to light.  Neck:     Vascular: No carotid bruit.  Cardiovascular:     Rate and Rhythm: Normal rate and regular rhythm.     Pulses: Normal pulses.     Heart sounds: Normal heart sounds.  Pulmonary:     Effort: Pulmonary effort is normal.     Breath sounds: Normal breath sounds.  Abdominal:     Palpations: Abdomen is soft.  Musculoskeletal:        General: Normal range of motion.     Cervical back: Normal range of motion and neck supple.  Lymphadenopathy:     Cervical: No cervical adenopathy.  Skin:    General: Skin is warm and dry.     Capillary Refill: Capillary refill takes less than 2 seconds.  Neurological:     General: No focal deficit present.     Mental Status: He is alert and oriented to person, place, and time.  Psychiatric:        Mood and Affect: Mood normal.        Behavior: Behavior normal.        Thought Content: Thought content normal.        Judgment: Judgment normal.      Assessment & Plan    Pulmonary nodule less than 1 cm in diameter with moderate to high risk for malignant neoplasm Assessment & Plan: Reviewed CT lung cancer screening with patient which new 8 mm pulmonary nodule.  Will get new CT ordered for pulmonary nodule follow-up in 21 October 2022. Continue regular visits with pulmonology as scheduled.  Orders: -     CT CHEST NODULE FOLLOW UP WO CONTRAST; Future  Dyspnea  on exertion Assessment & Plan: Continue inhalers and respiratory medications as prescribed. Continue regular visits with cardiology and pulmonology as scheduled.  Orders: -     Ambulatory referral to Cardiology  Coronary artery disease due to lipid rich plaque Assessment & Plan: Stable. Continue regular visits with cardiology as scheduled.  Orders: -     Ambulatory referral to Cardiology  Aortic atherosclerosis [facility]) Assessment & Plan: Stable. Continue regular visits with cardiology as scheduled.  Orders: -  Ambulatory referral to Cardiology  Generalized anxiety disorder Assessment & Plan: Stable. Continue citalopram as previously prescribed.   Orders: -     Citalopram Hydrobromide; Take 1 tablet (10 mg total) by mouth daily.  Dispense: 90 tablet; Refill: 1  Insomnia due to anxiety and fear Assessment & Plan: Continue trazodone as needed and as prescribed   Orders: -     traZODone HCl; Take 1 tablet (50 mg total) by mouth at bedtime as needed for sleep.  Dispense: 90 tablet; Refill: 1     Return for FBW in next few days. follow up a week or so after CT to discussdr. olson pls.  results. Carlean Jews, NP  [facility] at [facility] [phone] (phone) [phone] (fax)  [facility]

## 2022-10-16 ENCOUNTER — Other Ambulatory Visit: Payer: Medicare HMO

## 2022-10-16 DIAGNOSIS — Z0000 Encounter for general adult medical examination without abnormal findings: Secondary | ICD-10-CM

## 2022-10-16 DIAGNOSIS — Z130 Encounter for screening for diseases of the blood and blood-forming organs and certain disorders involving the immune mechanism: Secondary | ICD-10-CM | POA: Diagnosis not present

## 2022-10-16 DIAGNOSIS — Z1329 Encounter for screening for other suspected endocrine disorder: Secondary | ICD-10-CM | POA: Diagnosis not present

## 2022-10-16 DIAGNOSIS — Z13228 Encounter for screening for other metabolic disorders: Secondary | ICD-10-CM | POA: Diagnosis not present

## 2022-10-16 DIAGNOSIS — Z1321 Encounter for screening for nutritional disorder: Secondary | ICD-10-CM | POA: Diagnosis not present

## 2022-10-16 DIAGNOSIS — I2583 Coronary atherosclerosis due to lipid rich plaque: Secondary | ICD-10-CM | POA: Diagnosis not present

## 2022-10-17 LAB — COMPREHENSIVE METABOLIC PANEL
ALT: 18 IU/L (ref 0–44)
AST: 20 IU/L (ref 0–40)
Albumin: 4 g/dL (ref 3.8–4.8)
Alkaline Phosphatase: 77 IU/L (ref 44–121)
BUN/Creatinine Ratio: 22 (ref 10–24)
BUN: 19 mg/dL (ref 8–27)
Bilirubin Total: 1.5 mg/dL — ABNORMAL HIGH (ref 0.0–1.2)
CO2: 20 mmol/L (ref 20–29)
Calcium: 9.1 mg/dL (ref 8.6–10.2)
Chloride: 103 mmol/L (ref 96–106)
Creatinine, Ser: 0.88 mg/dL (ref 0.76–1.27)
Globulin, Total: 2.4 g/dL (ref 1.5–4.5)
Glucose: 89 mg/dL (ref 70–99)
Potassium: 4.4 mmol/L (ref 3.5–5.2)
Sodium: 140 mmol/L (ref 134–144)
Total Protein: 6.4 g/dL (ref 6.0–8.5)
eGFR: 89 mL/min/{1.73_m2} (ref >59)

## 2022-10-17 LAB — CBC
Hematocrit: 41.1 % (ref 37.5–51.0)
Hemoglobin: 14 g/dL (ref 13.0–17.7)
MCH: 32.6 pg (ref 26.6–33.0)
MCHC: 34.1 g/dL (ref 31.5–35.7)
MCV: 96 fL (ref 79–97)
Platelets: 242 10*3/uL (ref 150–450)
RBC: 4.29 x10E6/uL (ref 4.14–5.80)
RDW: 12.4 % (ref 11.6–15.4)
WBC: 6.9 10*3/uL (ref 3.4–10.8)

## 2022-10-17 LAB — TSH: TSH: 2.66 u[IU]/mL (ref 0.450–4.500)

## 2022-10-17 LAB — LIPID PANEL
Chol/HDL Ratio: 2.3 ratio (ref 0.0–5.0)
Cholesterol, Total: 163 mg/dL (ref 100–199)
HDL: 70 mg/dL (ref >39)
LDL Chol Calc (NIH): 81 mg/dL (ref 0–99)
Triglycerides: 63 mg/dL (ref 0–149)
VLDL Cholesterol Cal: 12 mg/dL (ref 5–40)

## 2022-10-17 LAB — HEMOGLOBIN A1C
Est. average glucose Bld gHb Est-mCnc: 111 mg/dL
Hgb A1c MFr Bld: 5.5 % (ref 4.8–5.6)

## 2022-10-17 NOTE — Progress Notes (Signed)
CT follow up pulmonary nodule to be scheduled.

## 2022-10-17 NOTE — Progress Notes (Signed)
Please let the patient know that his labs looked perfect. Thanks so much.   -HB

## 2022-10-20 NOTE — Progress Notes (Unsigned)
Cardiology Office Note:    Date:  10/21/2022   ID:  Arthur Harper, DOB 21-May-1945, MRN [id number]  PCP:  Sandre Kitty, MD  Cardiologist:  None  Electrophysiologist:  None   Referring MD: Carlean Jews, NP   Chief Complaint  Patient presents with   Chest Pain    History of Present Illness:    Arthur Harper is a 77 y.o. male with a hx of COPD, OSA who is referred by Vincent Gros, NP for evaluation of coronary calcifications.  CT chest for lung cancer screening 07/2022, noted to have coronary calcifications.  He reports he is having dyspnea with minimal exertion.  States that walking flight of stairs will feel short of breath.  Also reports left-sided chest pain, describes as sharp pain that can happen with rest or exertion, can last for 30 minutes.  He had prior syncopal episode during severe coughing spells but none recently.   ETT 04/2021 showed fair exercise capacity (7.9 METS), no evidence of ischemia.  Past Medical History:  Diagnosis Date   Agent orange exposure    COPD (chronic obstructive pulmonary disease) (HCC)    PTSD (post-traumatic stress disorder)    Sleep apnea     Past Surgical History:  Procedure Laterality Date   COLONOSCOPY     fx ribs     TONSILLECTOMY      Current Medications: Current Meds  Medication Sig   albuterol (VENTOLIN HFA) 108 (90 Base) MCG/ACT inhaler Inhale 2 puffs into the lungs every 6 (six) hours as needed.   ammonium lactate (LAC-HYDRIN) 12 % lotion Apply 1 Application topically as needed for dry skin.   aspirin 81 MG chewable tablet Chew 81 mg by mouth daily.   atorvastatin (LIPITOR) 20 MG tablet Take 1 tablet (20 mg total) by mouth daily.   Budeson-Glycopyrrol-Formoterol (BREZTRI AEROSPHERE) 160-9-4.8 MCG/ACT AERO Inhale 2 puffs into the lungs in the morning and at bedtime.   carboxymethylcellulose (REFRESH PLUS) 0.5 % SOLN INSTILL 1 DROP IN BOTH EYES FOUR TIMES A DAY AS NEEDED   cetirizine (ZYRTEC) 10 MG tablet  Take by mouth.   citalopram (CELEXA) 10 MG tablet Take 1 tablet (10 mg total) by mouth daily.   cyclobenzaprine (FLEXERIL) 10 MG tablet Take 1 tablet (10 mg total) by mouth 2 (two) times daily as needed for up to 20 doses for muscle spasms.   diclofenac Sodium (VOLTAREN) 1 % GEL Apply 2 g topically 4 (four) times daily.   fluticasone (FLONASE) 50 MCG/ACT nasal spray Place 1-2 sprays into both nostrils daily.   hydrocortisone 2.5 % cream APPLY SMALL AMOUNT TO AFFECTED AREA TWICE A DAY AS NEEDED -MIX WITH KETOCONAZOLE CREAM AND APPLY TO THE RED AREAS UNDER ARMPITS AND  IN THE GROIN TWICE DAILY AS NEEDED -MIX WITH KETOCONAZOLE CREAM AND APPLY TO THE RED AREAS UNDER ARMPITS AND   IN THE GROIN TWICE DAILY AS NEEDED   ibuprofen (ADVIL) 600 MG tablet Take 600 mg by mouth every 6 (six) hours as needed.   ketoconazole (NIZORAL) 2 % cream APPLY SMALL AMOUNT TO AFFECTED AREA TWICE A DAY -MIX WITH HYDROCORTISONE CREAM AND APPLY TO THE RED AREAS IN THE GROIN AND  ARMPIT TWICE DAILY AS NEEDED -MIX WITH HYDROCORTISONE CREAM AND APPLY TO THE RED AREAS IN THE GROIN AND  ARMPIT TWICE DAILY AS NEEDED   latanoprost (XALATAN) 0.005 % ophthalmic solution 1 drop at bedtime.   metoprolol tartrate (LOPRESSOR) 25 MG tablet Take 25 mg 2 hours before  Coronary CT   montelukast (SINGULAIR) 10 MG tablet Take 1 tablet (10 mg total) by mouth at bedtime.   Multiple Vitamin (MULTIVITAMIN) capsule Take 1 capsule by mouth daily.   omeprazole (PRILOSEC) 40 MG capsule Take 40 mg by mouth daily.   terazosin (HYTRIN) 5 MG capsule Take 5 mg by mouth at bedtime.   traZODone (DESYREL) 50 MG tablet Take 1 tablet (50 mg total) by mouth at bedtime as needed for sleep.   triamcinolone cream (KENALOG) 0.5 % Apply 1 Application topically 3 (three) times daily.     Allergies:   Patient has no known allergies.   Social History   Socioeconomic History   Marital status: Divorced    Spouse name: Not on file   Number of children: 2   Years of  education: Not on file   Highest education level: Not on file  Occupational History   Occupation: retired    Comment: Aeronautical engineer  Tobacco Use   Smoking status: Former    Packs/day: 1.00    Years: 40.00    Additional pack years: 0.00    Total pack years: 40.00    Types: Cigarettes    Quit date: 12/31/2008    Years since quitting: 13.8   Smokeless tobacco: Never   Tobacco comments:    10 cigs per day  Vaping Use   Vaping Use: Never used  Substance and Sexual Activity   Alcohol use: Yes    Alcohol/week: 3.0 - 4.0 standard drinks of alcohol    Types: 3 - 4 Cans of beer per week    Comment: occassional, 3 beers or glasses of wine per week   Drug use: No   Sexual activity: Yes    Partners: Female  Other Topics Concern   Not on file  Social History Narrative   Not on file   Social Determinants of Health   Financial Resource Strain: Low Risk  (09/19/2021)   Overall Financial Resource Strain (CARDIA)    Difficulty of Paying Living Expenses: Not hard at all  Food Insecurity: No Food Insecurity (09/19/2021)   Hunger Vital Sign    Worried About Running Out of Food in the Last Year: Never true    Ran Out of Food in the Last Year: Never true  Transportation Needs: No Transportation Needs (09/19/2021)   PRAPARE - Administrator, Civil Service (Medical): No    Lack of Transportation (Non-Medical): No  Physical Activity: Insufficiently Active (09/19/2021)   Exercise Vital Sign    Days of Exercise per Week: 1 day    Minutes of Exercise per Session: 30 min  Stress: No Stress Concern Present (09/19/2021)   [employer] of Occupational Health - Occupational Stress Questionnaire    Feeling of Stress : Only a little  Social Connections: Moderately Integrated (09/19/2021)   Social Connection and Isolation Panel [NHANES]    Frequency of Communication with Friends and Family: Twice a week    Frequency of Social Gatherings with Friends and Family: Once a week    Attends  Religious Services: 1 to 4 times per year    Active Member of [employer] or Organizations: Yes    Attends Banker Meetings: 1 to 4 times per year    Marital Status: Divorced     Family History: The patient's family history includes Emphysema in his father and mother. There is no history of Colon cancer, Rectal cancer, or Stomach cancer.  ROS:   Please see the history of  present illness.     All other systems reviewed and are negative.  EKGs/Labs/Other Studies Reviewed:    The following studies were reviewed today:   EKG:   10/21/22: Normal sinus rhythm, left axis deviation, no ST abnormalities  Recent Labs: 10/16/2022: ALT 18; BUN 19; Creatinine, Ser 0.88; Hemoglobin 14.0; Platelets 242; Potassium 4.4; Sodium 140; TSH 2.660  Recent Lipid Panel    Component Value Date/Time   CHOL 163 10/16/2022 0937   TRIG 63 10/16/2022 0937   HDL 70 10/16/2022 0937   CHOLHDL 2.3 10/16/2022 0937   CHOLHDL 3 08/14/2012 1158   VLDL 13.0 08/14/2012 1158   LDLCALC 81 10/16/2022 0937    Physical Exam:    VS:  BP 126/76   Pulse 68   Ht 5\' 9"  (1.753 m)   Wt 221 lb 6.4 oz (100.4 kg)   SpO2 95%   BMI 32.70 kg/m     Wt Readings from Last 3 Encounters:  10/21/22 221 lb 6.4 oz (100.4 kg)  10/15/22 219 lb 12.8 oz (99.7 kg)  09/26/22 225 lb (102.1 kg)     GEN:  Well nourished, well developed in no acute distress HEENT: Normal NECK: No JVD; No carotid bruits LYMPHATICS: No lymphadenopathy CARDIAC: RRR, no murmurs, rubs, gallops RESPIRATORY: Scattered expiratory wheezing ABDOMEN: Soft, non-tender, non-distended MUSCULOSKELETAL:  No edema; No deformity  SKIN: Warm and dry NEUROLOGIC:  Alert and oriented x 3 PSYCHIATRIC:  Normal affect   ASSESSMENT:    1. Precordial pain   2. Coronary artery calcification   3. DOE (dyspnea on exertion)   4. Hyperlipidemia, unspecified hyperlipidemia type    PLAN:    Chest pain: CT chest for lung cancer screening 07/2022 noted to have coronary  calcifications.  He is reporting atypical chest pain.  Also noted to have dyspnea with minimal exertion, which could represent anginal equivalent.  Recommend coronary CTA for further evaluation.  Will give Lopressor 25 mg prior to study.  Dyspnea: Check echocardiogram to evaluate for structural heart disease  Hyperlipidemia: LDL 81 on 10/16/2022.  Goal LDL less than 70 given coronary calcifications as above.  Will start atorvastatin 20 mg daily  RTC in 4 months   Medication Adjustments/Labs and Tests Ordered: Current medicines are reviewed at length with the patient today.  Concerns regarding medicines are outlined above.  Orders Placed This Encounter  Procedures   CT CORONARY MORPH W/CTA COR W/SCORE W/CA W/CM &/OR WO/CM   EKG 12-Lead   ECHOCARDIOGRAM COMPLETE   Meds ordered this encounter  Medications   atorvastatin (LIPITOR) 20 MG tablet    Sig: Take 1 tablet (20 mg total) by mouth daily.    Dispense:  90 tablet    Refill:  3   metoprolol tartrate (LOPRESSOR) 25 MG tablet    Sig: Take 25 mg 2 hours before Coronary CT    Dispense:  1 tablet    Refill:  0    Patient Instructions  Medication Instructions:  Start Atorvastatin 20 mg daily Continue all other medications *If you need a refill on your cardiac medications before your next appointment, please call your pharmacy*   Lab Work: None ordered   Testing/Procedures: Echo   Coronary CT will be scheduled after approved by insurance  Follow instructions below   Follow-Up: At [facility], you and your health needs are our priority.  As part of our continuing mission to provide you with exceptional heart care, we have created designated Provider Care Teams.  These Care Teams  include your primary Cardiologist (physician) and Advanced Practice Providers (APPs -  Physician Assistants and Nurse Practitioners) who all work together to provide you with the care you need, when you need it.  We recommend signing up for  the patient portal called "MyChart".  Sign up information is provided on this After Visit Summary.  MyChart is used to connect with patients for Virtual Visits (Telemedicine).  Patients are able to view lab/test results, encounter notes, upcoming appointments, etc.  Non-urgent messages can be sent to your provider as well.   To learn more about what you can do with MyChart, go to [email or web address].    Your next appointment:  4 months    Provider: Dr.Miyu Fenderson      Your cardiac CT will be scheduled at one of the below locations:   [facility] [address] [place], [place] [address] [phone]  OR  [facility] [address] [place], [place] [address] [phone]  OR   [facility] [address] [place], [place] [address] [phone]  If scheduled at [facility], please arrive at the [facility] and Children's Entrance (Entrance C2) of [facility] 30 minutes prior to test start time. You can use the FREE valet parking offered at entrance C (encouraged to control the heart rate for the test)  Proceed to the [facility] Radiology Department (first floor) to check-in and test prep.  All radiology patients and guests should use entrance C2 at [facility], accessed from [facility], even though the hospital's physical address listed is [address]    If scheduled at [facility] or [facility], please arrive 15 mins early for check-in and test prep.   Please follow these instructions carefully (unless otherwise directed):  An IV will be required for this test and Nitroglycerin will be given.  Hold all erectile dysfunction medications at least 3 days (72 hrs) prior to test. (Ie viagra, cialis, sildenafil, tadalafil, etc)   On the Night Before the Test: Be sure to Drink plenty of water. Do not  consume any caffeinated/decaffeinated beverages or chocolate 12 hours prior to your test. Do not take any antihistamines 12 hours prior to your test.   On the Day of the Test: Drink plenty of water until 1 hour prior to the test. Do not eat any food 1 hour prior to test. You may take your regular medications prior to the test.  Take metoprolol 25 mg  two hours prior to test.          After the Test: Drink plenty of water. After receiving IV contrast, you may experience a mild flushed feeling. This is normal. On occasion, you may experience a mild rash up to 24 hours after the test. This is not dangerous. If this occurs, you can take Benadryl 25 mg and increase your fluid intake. If you experience trouble breathing, this can be serious. If it is severe call 911 IMMEDIATELY. If it is mild, please call our office.  We will call to schedule your test 2-4 weeks out understanding that some insurance companies will need an authorization prior to the service being performed.   For more information and frequently asked questions, please visit our website : [email or web address]  For non-scheduling related questions, please contact the cardiac imaging nurse navigator should you have any questions/concerns: Rockwell Alexandria, Cardiac Imaging Nurse Navigator Larey Brick, Cardiac Imaging Nurse Navigator [place]  [facility] Direct Office Dial: [phone]   For scheduling needs, including cancellations and rescheduling, please call [place], [phone].     Signed, Little Ishikawa, MD  10/21/2022 5:04 PM    [facility]

## 2022-10-21 ENCOUNTER — Encounter: Payer: Self-pay | Admitting: Cardiology

## 2022-10-21 ENCOUNTER — Ambulatory Visit: Payer: Medicare HMO | Attending: Cardiology | Admitting: Cardiology

## 2022-10-21 VITALS — BP 126/76 | HR 68 | Ht 69.0 in | Wt 221.4 lb

## 2022-10-21 DIAGNOSIS — I2510 Atherosclerotic heart disease of native coronary artery without angina pectoris: Secondary | ICD-10-CM | POA: Diagnosis not present

## 2022-10-21 DIAGNOSIS — R072 Precordial pain: Secondary | ICD-10-CM | POA: Diagnosis not present

## 2022-10-21 DIAGNOSIS — I700 Atherosclerosis of aorta: Secondary | ICD-10-CM | POA: Insufficient documentation

## 2022-10-21 DIAGNOSIS — R0609 Other forms of dyspnea: Secondary | ICD-10-CM | POA: Diagnosis not present

## 2022-10-21 DIAGNOSIS — E785 Hyperlipidemia, unspecified: Secondary | ICD-10-CM

## 2022-10-21 DIAGNOSIS — I2584 Coronary atherosclerosis due to calcified coronary lesion: Secondary | ICD-10-CM | POA: Diagnosis not present

## 2022-10-21 MED ORDER — ATORVASTATIN CALCIUM 20 MG PO TABS: 20.0000 mg | ORAL_TABLET | Freq: Every day | ORAL | 3 refills | Status: DC

## 2022-10-21 MED ORDER — METOPROLOL TARTRATE 25 MG PO TABS: ORAL_TABLET | ORAL | 0 refills | Status: DC

## 2022-10-21 NOTE — Progress Notes (Signed)
Reviewed with patient during visit.  Follow-up CT for pulmonary nodule to be scheduled in July, 2024.

## 2022-10-21 NOTE — Assessment & Plan Note (Signed)
Stable. Continue regular visits with cardiology as scheduled.  

## 2022-10-21 NOTE — Patient Instructions (Addendum)
Medication Instructions:  Start Atorvastatin 20 mg daily Continue all other medications *If you need a refill on your cardiac medications before your next appointment, please call your pharmacy*   Lab Work: None ordered   Testing/Procedures: Echo   Coronary CT will be scheduled after approved by insurance  Follow instructions below   Follow-Up: At [facility], you and your health needs are our priority.  As part of our continuing mission to provide you with exceptional heart care, we have created designated Provider Care Teams.  These Care Teams include your primary Cardiologist (physician) and Advanced Practice Providers (APPs -  Physician Assistants and Nurse Practitioners) who all work together to provide you with the care you need, when you need it.  We recommend signing up for the patient portal called "MyChart".  Sign up information is provided on this After Visit Summary.  MyChart is used to connect with patients for Virtual Visits (Telemedicine).  Patients are able to view lab/test results, encounter notes, upcoming appointments, etc.  Non-urgent messages can be sent to your provider as well.   To learn more about what you can do with MyChart, go to [email or web address].    Your next appointment:  4 months    Provider: Dr.Schumann      Your cardiac CT will be scheduled at one of the below locations:   [facility] [address] [place], [place] [address] [phone]  OR  [facility] [address] [place], [place] [address] [phone]  OR   [facility] [address] [place], [place] [address] [phone]  If scheduled at [facility], please arrive at the [facility]'s Entrance (Entrance C2) of [facility] 30 minutes prior to test start time. You can use the FREE valet parking offered at entrance C (encouraged to control the heart  rate for the test)  [facility] (first floor) to check-in and test prep.  All radiology patients and guests should use entrance C2 at [facility], accessed from [facility], even though the hospital's physical address listed is [address]    If scheduled at [facility] or [facility], please arrive 15 mins early for check-in and test prep.   Please follow these instructions carefully (unless otherwise directed):  An IV will be required for this test and Nitroglycerin will be given.  Hold all erectile dysfunction medications at least 3 days (72 hrs) prior to test. (Ie viagra, cialis, sildenafil, tadalafil, etc)   On the Night Before the Test: Be sure to Drink plenty of water. Do not consume any caffeinated/decaffeinated beverages or chocolate 12 hours prior to your test. Do not take any antihistamines 12 hours prior to your test.   On the Day of the Test: Drink plenty of water until 1 hour prior to the test. Do not eat any food 1 hour prior to test. You may take your regular medications prior to the test.  Take metoprolol 25 mg  two hours prior to test.          After the Test: Drink plenty of water. After receiving IV contrast, you may experience a mild flushed feeling. This is normal. On occasion, you may experience a mild rash up to 24 hours after the test. This is not dangerous. If this occurs, you can take Benadryl 25 mg and increase your fluid intake. If you  experience trouble breathing, this can be serious. If it is severe call 911 IMMEDIATELY. If it is mild, please call our office.  We will call to schedule your test 2-4 weeks out understanding that some insurance companies will need an authorization prior to the service being performed.   For more information and frequently asked questions, please visit our website : [email or web address]  For  non-scheduling related questions, please contact the cardiac imaging nurse navigator should you have any questions/concerns: Rockwell Alexandria, Cardiac Imaging Nurse Navigator Larey Brick, Cardiac Imaging Nurse Navigator [facility] Direct Office Dial: [phone]   For scheduling needs, including cancellations and rescheduling, please call [place], [phone].

## 2022-10-21 NOTE — Assessment & Plan Note (Signed)
Reviewed CT lung cancer screening with patient which new 8 mm pulmonary nodule.  Will get new CT ordered for pulmonary nodule follow-up in 21 October 2022. Continue regular visits with pulmonology as scheduled.

## 2022-10-21 NOTE — Assessment & Plan Note (Signed)
Continue inhalers and respiratory medications as prescribed. Continue regular visits with cardiology and pulmonology as scheduled.

## 2022-10-21 NOTE — Assessment & Plan Note (Signed)
Continue trazodone as needed and as prescribed  

## 2022-10-21 NOTE — Assessment & Plan Note (Signed)
Stable. Continue citalopram as previously prescribed.  

## 2022-11-05 ENCOUNTER — Ambulatory Visit
Admission: RE | Admit: 2022-11-05 | Discharge: 2022-11-05 | Disposition: A | Discharge status: Home / Self Care | Payer: Medicare HMO | Source: Ambulatory Visit | Attending: Nurse Practitioner | Admitting: Nurse Practitioner

## 2022-11-05 DIAGNOSIS — R918 Other nonspecific abnormal finding of lung field: Secondary | ICD-10-CM | POA: Diagnosis not present

## 2022-11-05 DIAGNOSIS — R911 Solitary pulmonary nodule: Secondary | ICD-10-CM

## 2022-11-05 DIAGNOSIS — J432 Centrilobular emphysema: Secondary | ICD-10-CM | POA: Diagnosis not present

## 2022-11-05 DIAGNOSIS — I2510 Atherosclerotic heart disease of native coronary artery without angina pectoris: Secondary | ICD-10-CM | POA: Diagnosis not present

## 2022-11-05 DIAGNOSIS — I700 Atherosclerosis of aorta: Secondary | ICD-10-CM | POA: Diagnosis not present

## 2022-11-06 ENCOUNTER — Other Ambulatory Visit: Payer: Medicare HMO

## 2022-11-14 ENCOUNTER — Ambulatory Visit (HOSPITAL_COMMUNITY): Payer: Medicare HMO | Attending: Cardiology

## 2022-11-14 ENCOUNTER — Ambulatory Visit: Payer: Medicare HMO | Admitting: Nurse Practitioner

## 2022-11-14 DIAGNOSIS — I2510 Atherosclerotic heart disease of native coronary artery without angina pectoris: Secondary | ICD-10-CM

## 2022-11-14 DIAGNOSIS — R072 Precordial pain: Secondary | ICD-10-CM | POA: Diagnosis not present

## 2022-11-14 DIAGNOSIS — I5030 Unspecified diastolic (congestive) heart failure: Secondary | ICD-10-CM

## 2022-11-14 DIAGNOSIS — I2584 Coronary atherosclerosis due to calcified coronary lesion: Secondary | ICD-10-CM

## 2022-11-14 LAB — ECHOCARDIOGRAM COMPLETE
Area-P 1/2: 3.24 cm2
S' Lateral: 3.2 cm

## 2022-11-15 ENCOUNTER — Telehealth (HOSPITAL_COMMUNITY): Payer: Self-pay | Admitting: Emergency Medicine

## 2022-11-15 NOTE — Progress Notes (Signed)
Hi!  This patient has appointment with you on 11/27/2022. He does see cardiology and has cardiac testing scheduled for 11/20/2022.

## 2022-11-15 NOTE — Telephone Encounter (Signed)
Reaching out to patient to offer assistance regarding upcoming cardiac imaging study; pt verbalizes understanding of appt date/time, parking situation and where to check in, pre-test NPO status and medications ordered, and verified current allergies; name and call back number provided for further questions should they arise Sara Wallace RN Navigator Cardiac Imaging [facility] [phone] office [phone] cell 

## 2022-11-19 ENCOUNTER — Ambulatory Visit (HOSPITAL_COMMUNITY)
Admission: RE | Admit: 2022-11-19 | Discharge: 2022-11-19 | Disposition: A | Discharge status: Home / Self Care | Payer: Medicare HMO | Source: Ambulatory Visit | Attending: Cardiology | Admitting: Cardiology

## 2022-11-19 DIAGNOSIS — I2584 Coronary atherosclerosis due to calcified coronary lesion: Secondary | ICD-10-CM | POA: Diagnosis not present

## 2022-11-19 DIAGNOSIS — I2510 Atherosclerotic heart disease of native coronary artery without angina pectoris: Secondary | ICD-10-CM | POA: Diagnosis not present

## 2022-11-19 DIAGNOSIS — R072 Precordial pain: Secondary | ICD-10-CM | POA: Diagnosis not present

## 2022-11-19 DIAGNOSIS — I700 Atherosclerosis of aorta: Secondary | ICD-10-CM

## 2022-11-19 MED ORDER — IOHEXOL 350 MG/ML SOLN
95.0000 mL | Freq: Once | INTRAVENOUS | Status: AC | PRN
  Administered 2022-11-19: 95 mL via INTRAVENOUS

## 2022-11-19 MED ORDER — NITROGLYCERIN 0.4 MG SL SUBL
SUBLINGUAL_TABLET | SUBLINGUAL | Status: AC
  Filled 2022-11-19: qty 2

## 2022-11-19 MED ORDER — NITROGLYCERIN 0.4 MG SL SUBL
0.8000 mg | SUBLINGUAL_TABLET | SUBLINGUAL | Status: DC | PRN
  Administered 2022-11-19: 0.8 mg via SUBLINGUAL

## 2022-11-19 NOTE — Progress Notes (Signed)
Tolerated procedure without distress

## 2022-11-27 ENCOUNTER — Encounter: Payer: Self-pay | Admitting: Family Medicine

## 2022-11-27 ENCOUNTER — Ambulatory Visit (INDEPENDENT_AMBULATORY_CARE_PROVIDER_SITE_OTHER): Payer: Medicare HMO | Admitting: Family Medicine

## 2022-11-27 VITALS — BP 134/76 | HR 61 | Ht 69.0 in | Wt 228.0 lb

## 2022-11-27 DIAGNOSIS — F331 Major depressive disorder, recurrent, moderate: Secondary | ICD-10-CM

## 2022-11-27 DIAGNOSIS — F409 Phobic anxiety disorder, unspecified: Secondary | ICD-10-CM | POA: Diagnosis not present

## 2022-11-27 DIAGNOSIS — I2583 Coronary atherosclerosis due to lipid rich plaque: Secondary | ICD-10-CM

## 2022-11-27 DIAGNOSIS — D126 Benign neoplasm of colon, unspecified: Secondary | ICD-10-CM

## 2022-11-27 DIAGNOSIS — Z9189 Other specified personal risk factors, not elsewhere classified: Secondary | ICD-10-CM | POA: Diagnosis not present

## 2022-11-27 DIAGNOSIS — I2510 Atherosclerotic heart disease of native coronary artery without angina pectoris: Secondary | ICD-10-CM | POA: Diagnosis not present

## 2022-11-27 DIAGNOSIS — F5105 Insomnia due to other mental disorder: Secondary | ICD-10-CM | POA: Diagnosis not present

## 2022-11-27 DIAGNOSIS — R911 Solitary pulmonary nodule: Secondary | ICD-10-CM | POA: Diagnosis not present

## 2022-11-27 NOTE — Assessment & Plan Note (Signed)
Tubular adenoma found.  Follow-up in 5 years.

## 2022-11-27 NOTE — Assessment & Plan Note (Signed)
Most recent CT scan indicates benign process.  No further workup recommended.

## 2022-11-27 NOTE — Assessment & Plan Note (Signed)
Patient doing better with trazodone.  Has some nighttime awakenings but otherwise feels like medicine is working well.

## 2022-11-27 NOTE — Assessment & Plan Note (Signed)
Continue citalopram.  No questions or concerns.

## 2022-11-27 NOTE — Progress Notes (Signed)
   Established Patient Office Visit  Subjective   Patient ID: Arthur Harper, male    DOB: Feb 03, 1946  Age: 77 y.o. MRN: [id number]  Chief Complaint  Patient presents with   Medical Management of Chronic Issues    HPI  Pulmonary nodule-we discussed with the patient the most recent CT scanning showing stable pulmonary nodule.  Recommended normal follow-up.  Anxiety/depression-patient having no issues with his citalopram.  Tolerating it well.  Insomnia-patient doing his trazodone.  He feels like this is helping, still has some nighttime awakening.  But better than previously.  Dyspnea-patient had a normal echo.  We discussed the results.  We also discussed the results of his coronary artery calcium score and the presence of 30% stenosis in the LAD.  Advised to follow-up with his cardiologist.  PSA-patient wants to know if there was a recent PSA updated.  Discussed getting it repeated in the future.  Colonoscopy-patient recent colonoscopy.  They told him to come back in 5 years but also that he did not need any further colonoscopies.  Told patient that we could address to get in 5 years.   The 10-year ASCVD risk score (Arnett DK, et al., 2019) is: 24.2%  Health Maintenance Due  Topic Date Due   COVID-19 Vaccine (1) Never done   Medicare Annual Wellness (AWV)  09/20/2022   INFLUENZA VACCINE  11/21/2022      Objective:     BP 134/76   Pulse 61   Ht 5\' 9"  (1.753 m)   Wt 228 lb (103.4 kg)   SpO2 95%   BMI 33.67 kg/m    Physical Exam General: Alert, oriented CV: Regular rhythm Pulmonary: Lungs clear bilaterally Psych: Pleasant affect, good eye contact, spontaneous speech.   No results found for any visits on 11/27/22.      Assessment & Plan:   Pulmonary nodule less than 1 cm in diameter with moderate to high risk for malignant neoplasm Assessment & Plan: Most recent CT scan indicates benign process.  No further workup recommended.   Major depressive  disorder, recurrent episode, moderate (HCC) Assessment & Plan: Continue citalopram.  No questions or concerns.   Insomnia due to anxiety and fear Assessment & Plan: Patient doing better with trazodone.  Has some nighttime awakenings but otherwise feels like medicine is working well.   Coronary artery disease due to lipid rich plaque Assessment & Plan: Discussed patient's coronary artery calcium scoring of over 500, presence of 30% stenosis in LAD.  Recommended patient follow-up with his cardiologist in November.   Adenomatous polyp of colon, unspecified part of colon Assessment & Plan: Tubular adenoma found.  Follow-up in 5 years.      Return in about 5 months (around 04/29/2023) for CAD.    Sandre Kitty, MD

## 2022-11-27 NOTE — Assessment & Plan Note (Signed)
Discussed patient's coronary artery calcium scoring of over 500, presence of 30% stenosis in LAD.  Recommended patient follow-up with his cardiologist in November.

## 2022-11-27 NOTE — Patient Instructions (Signed)
It was nice to see you today,  We addressed the following topics today: -Your pulmonary nodule is stable.  No need for further workup at this time - Your echocardiogram was normal.  Please follow-up with your cardiologist in November. - Please let us know the name of the new inhaler you are using - Please schedule a lab visit for your PSA result  Have a great day,  Frederic Jericho, MD

## 2022-12-06 ENCOUNTER — Other Ambulatory Visit: Payer: Medicare HMO

## 2022-12-06 DIAGNOSIS — R972 Elevated prostate specific antigen [PSA]: Secondary | ICD-10-CM

## 2022-12-07 LAB — PSA: Prostate Specific Ag, Serum: 2.8 ng/mL (ref 0.0–4.0)

## 2022-12-10 ENCOUNTER — Ambulatory Visit (INDEPENDENT_AMBULATORY_CARE_PROVIDER_SITE_OTHER): Payer: Medicare HMO

## 2022-12-10 ENCOUNTER — Encounter: Payer: Medicare HMO | Admitting: Nurse Practitioner

## 2022-12-10 VITALS — Ht 69.0 in | Wt 228.0 lb

## 2022-12-10 DIAGNOSIS — Z0000 Encounter for general adult medical examination without abnormal findings: Secondary | ICD-10-CM

## 2022-12-10 NOTE — Patient Instructions (Addendum)
[name] , Thank you for taking time to come for your Medicare Wellness Visit. I appreciate your ongoing commitment to your health goals. Please review the following plan we discussed and let me know if I can assist you in the future.   Referrals/Orders/Follow-Ups/Clinician Recommendations:   This is a list of the screening recommended for you and due dates:  Health Maintenance  Topic Date Due   COVID-19 Vaccine (1) Never done   Flu Shot  11/21/2022   Screening for Lung Cancer  11/19/2023   Medicare Annual Wellness Visit  12/10/2023   DTaP/Tdap/Td vaccine (8 - Td or Tdap) 08/23/2031   Pneumonia Vaccine  Completed   Hepatitis C Screening  Completed   Zoster (Shingles) Vaccine  Completed   HPV Vaccine  Aged Out   Colon Cancer Screening  Discontinued    Advanced directives: (Copy Requested) Please bring a copy of your health care power of attorney and living will to the office to be added to your chart at your convenience.  Next Medicare Annual Wellness Visit scheduled for next year: Yes  Preventive Care 25 Years and Older, Male  Preventive care refers to lifestyle choices and visits with your health care provider that can promote health and wellness. What does preventive care include? A yearly physical exam. This is also called an annual well check. Dental exams once or twice a year. Routine eye exams. Ask your health care provider how often you should have your eyes checked. Personal lifestyle choices, including: Daily care of your teeth and gums. Regular physical activity. Eating a healthy diet. Avoiding tobacco and drug use. Limiting alcohol use. Practicing safe sex. Taking low doses of aspirin every day. Taking vitamin and mineral supplements as recommended by your health care provider. What happens during an annual well check? The services and screenings done by your health care provider during your annual well check will depend on your age, overall health, lifestyle  risk factors, and family history of disease. Counseling  Your health care provider may ask you questions about your: Alcohol use. Tobacco use. Drug use. Emotional well-being. Home and relationship well-being. Sexual activity. Eating habits. History of falls. Memory and ability to understand (cognition). Work and work Astronomer. Screening  You may have the following tests or measurements: Height, weight, and BMI. Blood pressure. Lipid and cholesterol levels. These may be checked every 5 years, or more frequently if you are over 62 years old. Skin check. Lung cancer screening. You may have this screening every year starting at age 70 if you have a 30-pack-year history of smoking and currently smoke or have quit within the past 15 years. Fecal occult blood test (FOBT) of the stool. You may have this test every year starting at age 33. Flexible sigmoidoscopy or colonoscopy. You may have a sigmoidoscopy every 5 years or a colonoscopy every 10 years starting at age 69. Prostate cancer screening. Recommendations will vary depending on your family history and other risks. Hepatitis C blood test. Hepatitis B blood test. Sexually transmitted disease (STD) testing. Diabetes screening. This is done by checking your blood sugar (glucose) after you have not eaten for a while (fasting). You may have this done every 1-3 years. Abdominal aortic aneurysm (AAA) screening. You may need this if you are a current or former smoker. Osteoporosis. You may be screened starting at age 74 if you are at high risk. Talk with your health care provider about your test results, treatment options, and if necessary, the need for more  tests. Vaccines  Your health care provider may recommend certain vaccines, such as: Influenza vaccine. This is recommended every year. Tetanus, diphtheria, and acellular pertussis (Tdap, Td) vaccine. You may need a Td booster every 10 years. Zoster vaccine. You may need this after age  63. Pneumococcal 13-valent conjugate (PCV13) vaccine. One dose is recommended after age 4. Pneumococcal polysaccharide (PPSV23) vaccine. One dose is recommended after age 47. Talk to your health care provider about which screenings and vaccines you need and how often you need them. This information is not intended to replace advice given to you by your health care provider. Make sure you discuss any questions you have with your health care provider. Document Released: 05/05/2015 Document Revised: 12/27/2015 Document Reviewed: 02/07/2015 Elsevier Interactive Patient Education  2017 [employer].  Fall Prevention in the Home Falls can cause injuries. They can happen to people of all ages. There are many things you can do to make your home safe and to help prevent falls. What can I do on the outside of my home? Regularly fix the edges of walkways and driveways and fix any cracks. Remove anything that might make you trip as you walk through a door, such as a raised step or threshold. Trim any bushes or trees on the path to your home. Use bright outdoor lighting. Clear any walking paths of anything that might make someone trip, such as rocks or tools. Regularly check to see if handrails are loose or broken. Make sure that both sides of any steps have handrails. Any raised decks and porches should have guardrails on the edges. Have any leaves, snow, or ice cleared regularly. Use sand or salt on walking paths during winter. Clean up any spills in your garage right away. This includes oil or grease spills. What can I do in the bathroom? Use night lights. Install grab bars by the toilet and in the tub and shower. Do not use towel bars as grab bars. Use non-skid mats or decals in the tub or shower. If you need to sit down in the shower, use a plastic, non-slip stool. Keep the floor dry. Clean up any water that spills on the floor as soon as it happens. Remove soap buildup in the tub or shower  regularly. Attach bath mats securely with double-sided non-slip rug tape. Do not have throw rugs and other things on the floor that can make you trip. What can I do in the bedroom? Use night lights. Make sure that you have a light by your bed that is easy to reach. Do not use any sheets or blankets that are too big for your bed. They should not hang down onto the floor. Have a firm chair that has side arms. You can use this for support while you get dressed. Do not have throw rugs and other things on the floor that can make you trip. What can I do in the kitchen? Clean up any spills right away. Avoid walking on wet floors. Keep items that you use a lot in easy-to-reach places. If you need to reach something above you, use a strong step stool that has a grab bar. Keep electrical cords out of the way. Do not use floor polish or wax that makes floors slippery. If you must use wax, use non-skid floor wax. Do not have throw rugs and other things on the floor that can make you trip. What can I do with my stairs? Do not leave any items on the stairs. Make sure  that there are handrails on both sides of the stairs and use them. Fix handrails that are broken or loose. Make sure that handrails are as long as the stairways. Check any carpeting to make sure that it is firmly attached to the stairs. Fix any carpet that is loose or worn. Avoid having throw rugs at the top or bottom of the stairs. If you do have throw rugs, attach them to the floor with carpet tape. Make sure that you have a light switch at the top of the stairs and the bottom of the stairs. If you do not have them, ask someone to add them for you. What else can I do to help prevent falls? Wear shoes that: Do not have high heels. Have rubber bottoms. Are comfortable and fit you well. Are closed at the toe. Do not wear sandals. If you use a stepladder: Make sure that it is fully opened. Do not climb a closed stepladder. Make sure that  both sides of the stepladder are locked into place. Ask someone to hold it for you, if possible. Clearly mark and make sure that you can see: Any grab bars or handrails. First and last steps. Where the edge of each step is. Use tools that help you move around (mobility aids) if they are needed. These include: Canes. Walkers. Scooters. Crutches. Turn on the lights when you go into a dark area. Replace any light bulbs as soon as they burn out. Set up your furniture so you have a clear path. Avoid moving your furniture around. If any of your floors are uneven, fix them. If there are any pets around you, be aware of where they are. Review your medicines with your doctor. Some medicines can make you feel dizzy. This can increase your chance of falling. Ask your doctor what other things that you can do to help prevent falls. This information is not intended to replace advice given to you by your health care provider. Make sure you discuss any questions you have with your health care provider. Document Released: 02/02/2009 Document Revised: 09/14/2015 Document Reviewed: 05/13/2014 Elsevier Interactive Patient Education  2017 [employer].

## 2022-12-10 NOTE — Progress Notes (Signed)
Subjective:   Arthur Harper is a 77 y.o. male who presents for Medicare Annual/Subsequent preventive examination.  Visit Complete: Virtual  I connected with  Priscella Mann on 12/10/22 by a audio enabled telemedicine application and verified that I am speaking with the correct person using two identifiers.  Patient Location: Home  Provider Location: Home Office  I discussed the limitations of evaluation and management by telemedicine. The patient expressed understanding and agreed to proceed.     Review of Systems    Vital Signs: Unable to obtain new vitals due to this being a telehealth visit.  Cardiac Risk Factors include: advanced age (>73men, >77 women);Other (see comment), Risk factor comments: Dx: CAD     Objective:    Today's Vitals   12/10/22 1310  Weight: 228 lb (103.4 kg)  Height: 5\' 9"  (1.753 m)   Body mass index is 33.67 kg/m.     12/10/2022    1:24 PM 05/15/2021    5:37 PM 04/26/2016    1:00 PM  Advanced Directives  Does Patient Have a Medical Advance Directive? Yes Yes No  Type of Estate agent of [place];Living will Living will;Healthcare Power of Attorney   Copy of Healthcare Power of Attorney in Chart? No - copy requested    Would patient like information on creating a medical advance directive?   Yes (MAU/Ambulatory/Procedural Areas - Information given)    Current Medications (verified) Outpatient Encounter Medications as of 12/10/2022  Medication Sig   albuterol (VENTOLIN HFA) 108 (90 Base) MCG/ACT inhaler Inhale 2 puffs into the lungs every 6 (six) hours as needed.   ammonium lactate (LAC-HYDRIN) 12 % lotion Apply 1 Application topically as needed for dry skin.   aspirin 81 MG chewable tablet Chew 81 mg by mouth daily.   atorvastatin (LIPITOR) 20 MG tablet Take 1 tablet (20 mg total) by mouth daily.   carboxymethylcellulose (REFRESH PLUS) 0.5 % SOLN INSTILL 1 DROP IN BOTH EYES FOUR TIMES A DAY AS NEEDED   cetirizine  (ZYRTEC) 10 MG tablet Take by mouth.   citalopram (CELEXA) 10 MG tablet Take 1 tablet (10 mg total) by mouth daily.   cyclobenzaprine (FLEXERIL) 10 MG tablet Take 1 tablet (10 mg total) by mouth 2 (two) times daily as needed for up to 20 doses for muscle spasms.   diclofenac Sodium (VOLTAREN) 1 % GEL Apply 2 g topically 4 (four) times daily.   fluticasone (FLONASE) 50 MCG/ACT nasal spray Place 1-2 sprays into both nostrils daily.   hydrocortisone 2.5 % cream APPLY SMALL AMOUNT TO AFFECTED AREA TWICE A DAY AS NEEDED -MIX WITH KETOCONAZOLE CREAM AND APPLY TO THE RED AREAS UNDER ARMPITS AND  IN THE GROIN TWICE DAILY AS NEEDED -MIX WITH KETOCONAZOLE CREAM AND APPLY TO THE RED AREAS UNDER ARMPITS AND   IN THE GROIN TWICE DAILY AS NEEDED   ibuprofen (ADVIL) 600 MG tablet Take 600 mg by mouth every 6 (six) hours as needed.   ketoconazole (NIZORAL) 2 % cream APPLY SMALL AMOUNT TO AFFECTED AREA TWICE A DAY -MIX WITH HYDROCORTISONE CREAM AND APPLY TO THE RED AREAS IN THE GROIN AND  ARMPIT TWICE DAILY AS NEEDED -MIX WITH HYDROCORTISONE CREAM AND APPLY TO THE RED AREAS IN THE GROIN AND  ARMPIT TWICE DAILY AS NEEDED   latanoprost (XALATAN) 0.005 % ophthalmic solution 1 drop at bedtime.   metoprolol tartrate (LOPRESSOR) 25 MG tablet Take 25 mg 2 hours before Coronary CT   montelukast (SINGULAIR) 10 MG tablet  Take 1 tablet (10 mg total) by mouth at bedtime.   Multiple Vitamin (MULTIVITAMIN) capsule Take 1 capsule by mouth daily.   omeprazole (PRILOSEC) 40 MG capsule Take 40 mg by mouth daily.   terazosin (HYTRIN) 5 MG capsule Take 5 mg by mouth at bedtime.   Tiotropium Bromide-Olodaterol (STIOLTO RESPIMAT) 2.5-2.5 MCG/ACT AERS Inhale 2 puffs into the lungs daily.   traZODone (DESYREL) 50 MG tablet Take 1 tablet (50 mg total) by mouth at bedtime as needed for sleep.   triamcinolone cream (KENALOG) 0.5 % Apply 1 Application topically 3 (three) times daily.   No facility-administered encounter medications on file as  of 12/10/2022.    Allergies (verified) Patient has no known allergies.   History: Past Medical History:  Diagnosis Date   Agent orange exposure    COPD (chronic obstructive pulmonary disease) (HCC)    PTSD (post-traumatic stress disorder)    Sleep apnea    Past Surgical History:  Procedure Laterality Date   COLONOSCOPY     fx ribs     TONSILLECTOMY     Family History  Problem Relation Age of Onset   Emphysema Mother    Emphysema Father    Colon cancer Neg Hx    Rectal cancer Neg Hx    Stomach cancer Neg Hx    Social History   Socioeconomic History   Marital status: Divorced    Spouse name: Not on file   Number of children: 2   Years of education: Not on file   Highest education level: Not on file  Occupational History   Occupation: retired    Comment: Aeronautical engineer  Tobacco Use   Smoking status: Former    Current packs/day: 0.00    Average packs/day: 1 pack/day for 40.0 years (40.0 ttl pk-yrs)    Types: Cigarettes    Start date: 12/31/1968    Quit date: 12/31/2008    Years since quitting: 13.9   Smokeless tobacco: Never   Tobacco comments:    10 cigs per day  Vaping Use   Vaping status: Never Used  Substance and Sexual Activity   Alcohol use: Yes    Alcohol/week: 3.0 - 4.0 standard drinks of alcohol    Types: 3 - 4 Cans of beer per week    Comment: occassional, 3 beers or glasses of wine per week   Drug use: No   Sexual activity: Yes    Partners: Female  Other Topics Concern   Not on file  Social History Narrative   Not on file   Social Determinants of Health   Financial Resource Strain: Low Risk  (12/10/2022)   Overall Financial Resource Strain (CARDIA)    Difficulty of Paying Living Expenses: Not hard at all  Food Insecurity: No Food Insecurity (12/10/2022)   Hunger Vital Sign    Worried About Running Out of Food in the Last Year: Never true    Ran Out of Food in the Last Year: Never true  Transportation Needs: No Transportation Needs  (12/10/2022)   PRAPARE - Administrator, Civil Service (Medical): No    Lack of Transportation (Non-Medical): No  Physical Activity: Sufficiently Active (12/10/2022)   Exercise Vital Sign    Days of Exercise per Week: 7 days    Minutes of Exercise per Session: 30 min  Stress: No Stress Concern Present (12/10/2022)   [employer] of Occupational Health - Occupational Stress Questionnaire    Feeling of Stress : Not at all  Social  Connections: Moderately Integrated (12/10/2022)   Social Connection and Isolation Panel [NHANES]    Frequency of Communication with Friends and Family: More than three times a week    Frequency of Social Gatherings with Friends and Family: More than three times a week    Attends Religious Services: More than 4 times per year    Active Member of [employer] or Organizations: Yes    Attends Engineer, structural: More than 4 times per year    Marital Status: Divorced    Tobacco Counseling Counseling given: Not Answered Tobacco comments: 10 cigs per day   Clinical Intake:  Pre-visit preparation completed: Yes  Pain : No/denies pain     BMI - recorded: 33.67 Nutritional Status: BMI > 30  Obese Nutritional Risks: None Diabetes: No  How often do you need to have someone help you when you read instructions, pamphlets, or other written materials from your doctor or pharmacy?: 1 - Never  Interpreter Needed?: No  Information entered by :: Theresa Mulligan LPN   Activities of Daily Living    12/10/2022    1:23 PM 03/19/2022    9:39 AM  In your present state of health, do you have any difficulty performing the following activities:  Hearing? 0 0  Vision? 0 0  Difficulty concentrating or making decisions? 0 0  Walking or climbing stairs? 0 0  Dressing or bathing? 0 0  Doing errands, shopping? 0 0  Preparing Food and eating ? N   Using the Toilet? N   In the past six months, have you accidently leaked urine? N   Do you have problems  with loss of bowel control? N   Managing your Medications? N   Managing your Finances? N   Housekeeping or managing your Housekeeping? N     Patient Care Team: Sandre Kitty, MD as PCP - General (Family Medicine)  Indicate any recent Medical Services you may have received from other than [facility] providers in the past year (date may be approximate).     Assessment:   This is a routine wellness examination for [place].  Hearing/Vision screen Hearing Screening - Comments:: Denies hearing difficulties   Vision Screening - Comments:: Wears rx glasses - up to date with routine eye exams with  [facility]  Dietary issues and exercise activities discussed:     Goals Addressed               This Visit's Progress     Stay Healthy (pt-stated)         Depression Screen    12/10/2022    1:22 PM 12/10/2022    1:21 PM 10/15/2022    1:36 PM 07/15/2022    2:02 PM 03/19/2022    9:39 AM 09/20/2021    9:32 AM 09/19/2021   10:57 AM  PHQ 2/9 Scores  PHQ - 2 Score 0 0 0 0 0 1 0  PHQ- 9 Score 0 0 4 0 0 4     Fall Risk    12/10/2022    1:23 PM 07/15/2022    2:02 PM 09/19/2021   10:56 AM 05/29/2021   10:52 AM 04/26/2016    1:11 PM  Fall Risk   Falls in the past year? 0 0 0 0 No  Number falls in past yr: 0 0 0 0   Injury with Fall? 0 0 0 0   Risk for fall due to : No Fall Risks  No Fall Risks  Follow up Falls prevention discussed Falls evaluation completed Falls evaluation completed Falls evaluation completed     MEDICARE RISK AT HOME: Medicare Risk at Home Any stairs in or around the home?: Yes If so, are there any without handrails?: No Home free of loose throw rugs in walkways, pet beds, electrical cords, etc?: Yes Adequate lighting in your home to reduce risk of falls?: Yes Life alert?: No Use of a cane, walker or w/c?: No Grab bars in the bathroom?: No Shower chair or bench in shower?: No Elevated toilet seat or a handicapped toilet?: No  TIMED UP AND GO:  Was the  test performed?  No    Cognitive Function:        12/10/2022    1:24 PM 09/19/2021   10:57 AM  6CIT Screen  What Year? 0 points 0 points  What month? 0 points 0 points  What time? 0 points 0 points  Count back from 20 0 points 0 points  Months in reverse 0 points 2 points  Repeat phrase 0 points 2 points  Total Score 0 points 4 points    Immunizations Immunization History  Administered Date(s) Administered   Fluad Quad(high Dose 65+) 02/21/2021   Influenza Split 01/21/2011, 02/17/2012   Influenza Whole 01/20/2009   Influenza, High Dose Seasonal PF 05/05/2015, 01/22/2016, 02/06/2016, 02/14/2017, 01/18/2019, 02/20/2022   Influenza, Seasonal, Injecte, Preservative Fre 02/15/2009, 02/19/2010, 02/12/2011, 01/29/2012, 01/12/2013, 02/02/2014   Influenza-Unspecified 02/20/1998, 02/18/2000, 02/24/2001, 02/17/2002, 04/18/2003, 02/28/2004, 02/19/2005, 01/20/2006, 02/25/2007, 01/06/2008, 02/19/2010, 12/21/2012, 02/18/2018   Pneumococcal Conjugate-13 02/02/2014   Pneumococcal Polysaccharide-23 04/23/2007, 01/06/2008, 08/14/2012, 01/27/2013, 08/20/2016   Td 10/13/1997, 04/23/2004   Td (Adult),unspecified 10/13/1997   Tdap 09/21/2006, 09/18/2011, 08/22/2021   Tetanus 08/24/2011   Varicella 04/22/2009   Zoster Recombinant(Shingrix) 02/10/2019, 04/12/2019   Zoster, Live 02/02/2014    TDAP status: Up to date  Flu Vaccine status: Due, Education has been provided regarding the importance of this vaccine. Advised may receive this vaccine at local pharmacy or Health Dept. Aware to provide a copy of the vaccination record if obtained from local pharmacy or Health Dept. Verbalized acceptance and understanding.  Pneumococcal vaccine status: Up to date  Covid-19 vaccine status: Declined, Education has been provided regarding the importance of this vaccine but patient still declined. Advised may receive this vaccine at local pharmacy or Health Dept.or vaccine clinic. Aware to provide a copy of the  vaccination record if obtained from local pharmacy or Health Dept. Verbalized acceptance and understanding.  Qualifies for Shingles Vaccine? Yes   Zostavax completed Yes   Shingrix Completed?: Yes  Screening Tests Health Maintenance  Topic Date Due   COVID-19 Vaccine (1) Never done   INFLUENZA VACCINE  11/21/2022   Lung Cancer Screening  11/19/2023   Medicare Annual Wellness (AWV)  12/10/2023   DTaP/Tdap/Td (8 - Td or Tdap) 08/23/2031   Pneumonia Vaccine 36+ Years old  Completed   Hepatitis C Screening  Completed   Zoster Vaccines- Shingrix  Completed   HPV VACCINES  Aged Out   Colonoscopy  Discontinued    Health Maintenance  Health Maintenance Due  Topic Date Due   COVID-19 Vaccine (1) Never done   INFLUENZA VACCINE  11/21/2022      Lung Cancer Screening: (Low Dose CT Chest recommended if Age 1-80 years, 20 pack-year currently smoking OR have quit w/in 15years.) does qualify.     Additional Screening:  Hepatitis C Screening: does qualify; Completed 09/20/21  Vision Screening: Recommended annual ophthalmology exams for early detection  of glaucoma and other disorders of the eye. Is the patient up to date with their annual eye exam?  Yes  Who is the provider or what is the name of the office in which the patient attends annual eye exams? [facility] If pt is not established with a provider, would they like to be referred to a provider to establish care? No .   Dental Screening: Recommended annual dental exams for proper oral hygiene    Community Resource Referral / Chronic Care Management:  CRR required this visit?  No   CCM required this visit?  No     Plan:     I have personally reviewed and noted the following in the patient's chart:   Medical and social history Use of alcohol, tobacco or illicit drugs  Current medications and supplements including opioid prescriptions. Patient is not currently taking opioid prescriptions. Functional ability and  status Nutritional status Physical activity Advanced directives List of other physicians Hospitalizations, surgeries, and ER visits in previous 12 months Vitals Screenings to include cognitive, depression, and falls Referrals and appointments  In addition, I have reviewed and discussed with patient certain preventive protocols, quality metrics, and best practice recommendations. A written personalized care plan for preventive services as well as general preventive health recommendations were provided to patient.     Tillie Rung, LPN   1/61/0960   After Visit Summary: (MyChart) Due to this being a telephonic visit, the after visit summary with patients personalized plan was offered to patient via MyChart   Nurse Notes: None

## 2023-02-06 ENCOUNTER — Ambulatory Visit: Payer: Non-veteran care | Admitting: Internal Medicine

## 2023-02-18 ENCOUNTER — Ambulatory Visit: Payer: Medicare HMO | Admitting: Internal Medicine

## 2023-02-18 ENCOUNTER — Telehealth: Payer: Self-pay

## 2023-02-18 ENCOUNTER — Encounter: Payer: Self-pay | Admitting: Internal Medicine

## 2023-02-18 VITALS — BP 155/69 | HR 60 | Temp 97.9°F | Ht 69.0 in | Wt 241.0 lb

## 2023-02-18 DIAGNOSIS — K219 Gastro-esophageal reflux disease without esophagitis: Secondary | ICD-10-CM | POA: Diagnosis not present

## 2023-02-18 DIAGNOSIS — J449 Chronic obstructive pulmonary disease, unspecified: Secondary | ICD-10-CM

## 2023-02-18 MED ORDER — OMEPRAZOLE 40 MG PO CPDR: DELAYED_RELEASE_CAPSULE | ORAL | 11 refills | Status: DC

## 2023-02-18 MED ORDER — FAMOTIDINE 20 MG PO TABS
ORAL_TABLET | ORAL | 11 refills | Status: AC
Start: 2023-02-18 — End: ?

## 2023-02-18 MED ORDER — FAMOTIDINE 20 MG PO TABS: ORAL_TABLET | ORAL | 11 refills | Status: DC

## 2023-02-18 MED ORDER — OMEPRAZOLE 40 MG PO CPDR
DELAYED_RELEASE_CAPSULE | ORAL | 11 refills | Status: AC
Start: 2023-02-18 — End: ?

## 2023-02-18 NOTE — Progress Notes (Unsigned)
Arthur Harper, male    DOB: 02-Mar-1946,    MRN: [id number]   Brief patient profile:  37 yowm MM/quit smoking 2010 @ wt 180  referred to pulmonary clinic 02/08/2021 [facility] Cl[facility] with GOLD 2 criteria in 05/2010.       History of Present Illness  02/08/2021  Pulmonary/ 1st office eval/[name]  Chief Complaint  Patient presents with   Consult    Referred by Dr. Shelle Iron from [facility]  Dyspnea:  [place] stops every 5-10 min no routine  Cough: tickle in throat for more than a year or two, min clear  mucus prod/ slt hoarse Sleep: cpap per Clance  SABA use: once or twice a month hfa/ no neb  Rec Plan A = Automatic = Always=  Symbicort  Take 2 puffs first thing in am and then another 2 puffs about 12 hours later and spiriva 2 puffs in am  Work on inhaler technique:    Plan B = Backup (to supplement plan A, not to replace it) Only use your albuterol inhaler as a rescue medication  Ok to try albuterol 15 min before an activity (on alternating days)  that you know would usually make you short of breath         02/05/2022  f/u ov/[name] re: GOLD 2 COPD    maint on Breztri   Chief Complaint  Patient presents with   Follow-up    Pt states he has a a severe pain in the right backside of his head that started x 2 nights ago.  Dyspnea:  no sustained ex/ splitting wood is about as much as he ever does Cough: occ clear mucus Sleeping: cpap / flat/ one pillow  SABA use: once a week 02: none  Covid status:   never vax/ once infected  [facility]   Recurrent cx muscle spasm prv eval with CT Angio 05/15/21 with djd only  Rec Plan A = Automatic = Always=   Breztri Take 2 puffs first thing in am and then another 2 puffs about 12 hours later.   Plan B = Backup (to supplement plan A, not to replace it) Only use your albuterol inhaler as a rescue medication Advil 600 mg three times a day with meals for up to a week       02/18/2023  12 m  f/u ov/[name] re: GOLD 2 COPD   maint on stiolto/singulair   Chief Complaint  Patient presents with   Follow-up  Dyspnea:  very little walking/ yard work = mostly riding Firefighter though doing some weed eating  Cough: shrill/ very harsh assoc with   globus sensation > no production  Sleeping: cpap flat bed one pillow  s  resp cc  SABA use: rarely  02: none   [facility] cancer [facility] :  [facility]     No obvious day to day or daytime variability or assoc excess/ purulent sputum or mucus plugs or hemoptysis or cp or chest tightness, subjective wheeze or overt sinus or hb symptoms.    Also denies any obvious fluctuation of symptoms with weather or environmental changes or other aggravating or alleviating factors except as outlined above   No unusual exposure hx or h/o childhood pna/ asthma or knowledge of premature birth.  Current Allergies, Complete Past Medical History, Past Surgical History, Family History, and Social History were reviewed in [employer] record.  ROS  The following are not active complaints unless bolded Hoarseness, sore throat, dysphagia, dental problems, itching, sneezing,  nasal congestion or discharge of excess mucus or purulent secretions, ear ache,   fever, chills, sweats, unintended wt loss or wt gain, classically pleuritic or exertional cp,  orthopnea pnd or arm/hand swelling  or leg swelling, presyncope, palpitations, abdominal pain, anorexia, nausea, vomiting, diarrhea  or change in bowel habits or change in bladder habits, change in stools or change in urine, dysuria, hematuria,  rash, arthralgias, visual complaints, headache, numbness, weakness or ataxia or problems with walking or coordination,  change in mood or  memory.        Current Meds  Medication Sig   albuterol (VENTOLIN HFA) 108 (90 Base) MCG/ACT inhaler Inhale 2 puffs into the lungs every 6 (six) hours as needed.   ammonium lactate (LAC-HYDRIN) 12 % lotion Apply  1 Application topically as needed for dry skin.   aspirin 81 MG chewable tablet Chew 81 mg by mouth daily.   carboxymethylcellulose (REFRESH PLUS) 0.5 % SOLN INSTILL 1 DROP IN BOTH EYES FOUR TIMES A DAY AS NEEDED   cetirizine (ZYRTEC) 10 MG tablet Take by mouth.   citalopram (CELEXA) 10 MG tablet Take 1 tablet (10 mg total) by mouth daily.   cyclobenzaprine (FLEXERIL) 10 MG tablet Take 1 tablet (10 mg total) by mouth 2 (two) times daily as needed for up to 20 doses for muscle spasms.   diclofenac Sodium (VOLTAREN) 1 % GEL Apply 2 g topically 4 (four) times daily.   fluticasone (FLONASE) 50 MCG/ACT nasal spray Place 1-2 sprays into both nostrils daily.   hydrocortisone 2.5 % cream APPLY SMALL AMOUNT TO AFFECTED AREA TWICE A DAY AS NEEDED -MIX WITH KETOCONAZOLE CREAM AND APPLY TO THE RED AREAS UNDER ARMPITS AND  IN THE GROIN TWICE DAILY AS NEEDED -MIX WITH KETOCONAZOLE CREAM AND APPLY TO THE RED AREAS UNDER ARMPITS AND   IN THE GROIN TWICE DAILY AS NEEDED   ibuprofen (ADVIL) 600 MG tablet Take 600 mg by mouth every 6 (six) hours as needed.   ketoconazole (NIZORAL) 2 % cream APPLY SMALL AMOUNT TO AFFECTED AREA TWICE A DAY -MIX WITH HYDROCORTISONE CREAM AND APPLY TO THE RED AREAS IN THE GROIN AND  ARMPIT TWICE DAILY AS NEEDED -MIX WITH HYDROCORTISONE CREAM AND APPLY TO THE RED AREAS IN THE GROIN AND  ARMPIT TWICE DAILY AS NEEDED   latanoprost (XALATAN) 0.005 % ophthalmic solution 1 drop at bedtime.   metoprolol tartrate (LOPRESSOR) 25 MG tablet Take 25 mg 2 hours before Coronary CT   montelukast (SINGULAIR) 10 MG tablet Take 1 tablet (10 mg total) by mouth at bedtime.   Multiple Vitamin (MULTIVITAMIN) capsule Take 1 capsule by mouth daily.   omeprazole (PRILOSEC) 40 MG capsule Take 40 mg by mouth daily.   terazosin (HYTRIN) 5 MG capsule Take 5 mg by mouth at bedtime.   Tiotropium Bromide-Olodaterol (STIOLTO RESPIMAT) 2.5-2.5 MCG/ACT AERS Inhale 2 puffs into the lungs daily.   traZODone (DESYREL) 50 MG  tablet Take 1 tablet (50 mg total) by mouth at bedtime as needed for sleep.   triamcinolone cream (KENALOG) 0.5 % Apply 1 Application topically 3 (three) times daily.        Past Medical History:  Diagnosis Date   Agent orange exposure    COPD (chronic obstructive pulmonary disease) (HCC)    PTSD (post-traumatic stress disorder)    Sleep apnea      Objective:    wts   02/18/2023  241   02/05/2022     220   06/20/21 221 lb (100.2 kg)  06/12/21 224 lb 6.4 oz (101.8 kg)  05/29/21 227 lb (103 kg)    Vital signs reviewed  02/18/2023  - Note at rest 02 sats  95% on RA   General appearance:    mod obese amb wm shrill dry paroxysmal cough    HEENT : Oropharynx  clear   Nasal turbinates nl    NECK :  without  apparent JVD/ palpable Nodes/TM    LUNGS: no acc muscle use,  Min barrel  contour chest wall with bilateral  slightly decreased bs s audible wheeze and  without cough on insp or exp maneuvers and min  Hyperresonant  to  percussion bilaterally    CV:  RRR  no s3 or murmur or increase in P2, and no edema   ABD:  soft and nontender with pos end  insp Hoover's  in the supine position.  No bruits or organomegaly appreciated   MS:  Nl gait/ ext warm without deformities Or obvious joint restrictions  calf tenderness, cyanosis or clubbing     SKIN: warm and dry with L > R  LE veriscosities/ mild venous stasis changes    NEURO:  alert, approp, nl sensorium with  no motor or cerebellar deficits apparent.                      Assessment

## 2023-02-18 NOTE — Telephone Encounter (Signed)
Patient had OV today 02/18/2023 with Dr. Sherene Sires.  Patient was given 2 rx.  These were electronically sent to [facility] pharmacy at [facility].  Patient stated when he left we needed to fax a copy of the rx to Dr. Marlyne Beards office for prior approval, then they would send rx on to the pharmacy. Fax # [phone]. Tried x 3 to fax rx to Dr. Marlyne Beards office.  Fax: no answer.   Here are rx details: famotidine (PEPCID) 20 MG tablet   omeprazole (PRILOSEC) 40 MG capsule   Try prilosec (omeprazole) 40mg   Take 30-60 min before first meal of the day and Pepcid ac (famotidine) 20 mg one @  bedtime   Patient will call and give another number to get rx faxed to Dr. Marlyne Beards.

## 2023-02-18 NOTE — Patient Instructions (Signed)
Try prilosec (omeprazole) 40mg   Take 30-60 min before first meal of the day and Pepcid ac (famotidine) 20 mg one @  bedtime until you see [facility] - take all meds / inhalers with you  GERD (REFLUX)  is an extremely common cause of respiratory symptoms just like yours , many times with no obvious heartburn at all.    It can be treated with medication, but also with lifestyle changes including elevation of the head of your bed (ideally with 6 -8inch blocks under the headboard of your bed),  Smoking cessation, avoidance of late meals, excessive alcohol, and avoid fatty foods, chocolate, peppermint, colas, red wine, and acidic juices such as orange juice.  NO MINT OR MENTHOL PRODUCTS SO NO COUGH DROPS  USE SUGARLESS CANDY INSTEAD (Jolley ranchers or Stover's or Life Savers) or even ice chips will also do - the key is to swallow to prevent all throat clearing. NO OIL BASED VITAMINS - use powdered substitutes.  Avoid fish oil when coughing.   Also  Ok to try albuterol 15 min before an activity (on alternating days like starter fluid )  that you know would usually make you short of breath and see if it makes any difference and if makes none then don't take albuterol after activity unless you can't catch your breath as this means it's the resting that helps, not the albuterol.        If you are satisfied with your treatment plan,  let your doctor know and he/she can either refill your medications or you can return here when your prescription runs out.     If in any way you are not 100% satisfied,  please tell us.  If 100% better, tell your friends!  Pulmonary follow up is as needed

## 2023-02-19 NOTE — Assessment & Plan Note (Signed)
Quit smoking 2010/ MM - Spirometry 07/03/20  FEV1 1.94 (59%)  Ratio 0.50 p 30% improvement from saba   - 02/08/2021   Walked on RA x  3  lap(s) =  approx 750 @ nl pace, stopped due to end of study but sob p one lap with lowest 02 sats 95%  - 02/08/2021   Try symbicort/spiriva - Labs ordered 02/08/2021  :  allergy profile  Eos 0.2/ IgE not done/ alpha one AT phenotype MM level 167  - PFT's  03/22/21 FEV1 1.9 (72 % ) ratio 0.57  p 13 % improvement from saba p symbicort/spiriva prior to study with DLCO  Nl FV curve classic concavity  02/05/2022  After extensive coaching inhaler device,  effectiveness =   90% > continue breztri  - 02/18/2023 final pulmoanry ov/ changed to stiolto due to [place] restrictions with severe dry daytime cough > rec max gerd rx > [facility]  Comment  In my experience, stiolto is the least likely of all the inhalers to cause a cough so likely this is related to LPR/ GERD and plans [facility] next [facility] but strongly advised go with all meds in hand using a trust but verify approach to confirm accurate Medication  Reconciliation The principal here is that until we are certain that the  patients are doing what we've asked, it makes no sense to ask them to do more.   F/u here is as directed by the [place] program or he can see me under the medicare / aetna prn          Each maintenance medication was reviewed in detail including emphasizing most importantly the difference between maintenance and prns and under what circumstances the prns are to be triggered using an action plan format where appropriate.  Total time for H and P, chart review, counseling, reviewing hfa/SMI  device(s) and generating customized AVS unique to this office visit / same day charting  > 30 min final summary f/u ov

## 2023-02-20 NOTE — Progress Notes (Addendum)
Cardiology Office Note:    Date:  02/21/2023   ID:  Arthur Harper, DOB [birth year], MRN [id number]  PCP:  Sandre Kitty, MD  Cardiologist:  None  Electrophysiologist:  None   Referring MD: Sandre Kitty, MD   Chief Complaint  Patient presents with   Shortness of Breath    History of Present Illness:    Arthur Harper is a 77 y.o. male with a hx of COPD, OSA who is referred by Vincent Gros, NP for evaluation of coronary calcifications.  CT chest for lung cancer screening 07/2022, noted to have coronary calcifications.  He reports he is having dyspnea with minimal exertion.  States that walking flight of stairs will feel short of breath.  Also reports left-sided chest pain, describes as sharp pain that can happen with rest or exertion, can last for 30 minutes.  He had prior syncopal episode during severe coughing spells but none recently.  ETT 04/2021 showed fair exercise capacity (7.9 METS), no evidence of ischemia.  Echocardiogram 11/14/2022 showed EF 60 to 65%, grade 1 diastolic dysfunction, normal RV function, no significant valvular disease.  Coronary CTA 11/19/2022 showed nonobstructive CAD with mild stenosis in proximal to mid LAD, calcium score 507 (63rd percentile).  Since last clinic visit, he reports that he is doing well.  Continues to have some shortness of breath.  Denies any chest pain, lightheadedness, syncope, lower extremity edema, or palpitations.    Past Medical History:  Diagnosis Date   Agent orange exposure    COPD (chronic obstructive pulmonary disease) (HCC)    PTSD (post-traumatic stress disorder)    Sleep apnea     Past Surgical History:  Procedure Laterality Date   COLONOSCOPY     fx ribs     TONSILLECTOMY      Current Medications: Current Meds  Medication Sig   albuterol (VENTOLIN HFA) 108 (90 Base) MCG/ACT inhaler Inhale 2 puffs into the lungs every 6 (six) hours as needed.   ammonium lactate (LAC-HYDRIN) 12 % lotion Apply 1  Application topically as needed for dry skin.   aspirin 81 MG chewable tablet Chew 81 mg by mouth daily.   atorvastatin (LIPITOR) 20 MG tablet Take 20 mg by mouth daily.   carboxymethylcellulose (REFRESH PLUS) 0.5 % SOLN INSTILL 1 DROP IN BOTH EYES FOUR TIMES A DAY AS NEEDED   cetirizine (ZYRTEC) 10 MG tablet Take by mouth.   citalopram (CELEXA) 10 MG tablet Take 1 tablet (10 mg total) by mouth daily.   cyclobenzaprine (FLEXERIL) 10 MG tablet Take 1 tablet (10 mg total) by mouth 2 (two) times daily as needed for up to 20 doses for muscle spasms.   diclofenac Sodium (VOLTAREN) 1 % GEL Apply 2 g topically 4 (four) times daily.   fluticasone (FLONASE) 50 MCG/ACT nasal spray Place 1-2 sprays into both nostrils daily.   hydrocortisone 2.5 % cream APPLY SMALL AMOUNT TO AFFECTED AREA TWICE A DAY AS NEEDED -MIX WITH KETOCONAZOLE CREAM AND APPLY TO THE RED AREAS UNDER ARMPITS AND  IN THE GROIN TWICE DAILY AS NEEDED -MIX WITH KETOCONAZOLE CREAM AND APPLY TO THE RED AREAS UNDER ARMPITS AND   IN THE GROIN TWICE DAILY AS NEEDED   ibuprofen (ADVIL) 600 MG tablet Take 600 mg by mouth every 6 (six) hours as needed.   ketoconazole (NIZORAL) 2 % cream APPLY SMALL AMOUNT TO AFFECTED AREA TWICE A DAY -MIX WITH HYDROCORTISONE CREAM AND APPLY TO THE RED AREAS IN THE GROIN AND  ARMPIT  TWICE DAILY AS NEEDED -MIX WITH HYDROCORTISONE CREAM AND APPLY TO THE RED AREAS IN THE GROIN AND  ARMPIT TWICE DAILY AS NEEDED   latanoprost (XALATAN) 0.005 % ophthalmic solution 1 drop at bedtime.   montelukast (SINGULAIR) 10 MG tablet Take 1 tablet (10 mg total) by mouth at bedtime.   Multiple Vitamin (MULTIVITAMIN) capsule Take 1 capsule by mouth daily.   terazosin (HYTRIN) 5 MG capsule Take 5 mg by mouth at bedtime.   Tiotropium Bromide-Olodaterol (STIOLTO RESPIMAT) 2.5-2.5 MCG/ACT AERS Inhale 2 puffs into the lungs daily.   traZODone (DESYREL) 50 MG tablet Take 1 tablet (50 mg total) by mouth at bedtime as needed for sleep.    triamcinolone cream (KENALOG) 0.5 % Apply 1 Application topically 3 (three) times daily.     Allergies:   Patient has no known allergies.   Social History   Socioeconomic History   Marital status: Divorced    Spouse name: Not on file   Number of children: 2   Years of education: Not on file   Highest education level: Not on file  Occupational History   Occupation: retired    Comment: Aeronautical engineer  Tobacco Use   Smoking status: Former    Current packs/day: 0.00    Average packs/day: 1 pack/day for 40.0 years (40.0 ttl pk-yrs)    Types: Cigarettes    Start date: 12/31/1968    Quit date: 12/31/2008    Years since quitting: 14.1   Smokeless tobacco: Never   Tobacco comments:    10 cigs per day  Vaping Use   Vaping status: Never Used  Substance and Sexual Activity   Alcohol use: Yes    Alcohol/week: 3.0 - 4.0 standard drinks of alcohol    Types: 3 - 4 Cans of beer per week    Comment: occassional, 3 beers or glasses of wine per week   Drug use: No   Sexual activity: Yes    Partners: Female  Other Topics Concern   Not on file  Social History Narrative   Not on file   Social Determinants of Health   Financial Resource Strain: Low Risk  (12/10/2022)   Overall Financial Resource Strain (CARDIA)    Difficulty of Paying Living Expenses: Not hard at all  Food Insecurity: No Food Insecurity (12/10/2022)   Hunger Vital Sign    Worried About Running Out of Food in the Last Year: Never true    Ran Out of Food in the Last Year: Never true  Transportation Needs: No Transportation Needs (12/10/2022)   PRAPARE - Administrator, Civil Service (Medical): No    Lack of Transportation (Non-Medical): No  Physical Activity: Sufficiently Active (12/10/2022)   Exercise Vital Sign    Days of Exercise per Week: 7 days    Minutes of Exercise per Session: 30 min  Stress: No Stress Concern Present (12/10/2022)   [employer] of Occupational Health - Occupational Stress  Questionnaire    Feeling of Stress : Not at all  Social Connections: Moderately Integrated (12/10/2022)   Social Connection and Isolation Panel [NHANES]    Frequency of Communication with Friends and Family: More than three times a week    Frequency of Social Gatherings with Friends and Family: More than three times a week    Attends Religious Services: More than 4 times per year    Active Member of [employer] or Organizations: Yes    Attends Banker Meetings: More than 4 times per year  Marital Status: Divorced     Family History: The patient's family history includes Emphysema in his father and mother. There is no history of Colon cancer, Rectal cancer, or Stomach cancer.  ROS:   Please see the history of present illness.     All other systems reviewed and are negative.  EKGs/Labs/Other Studies Reviewed:    The following studies were reviewed today:   EKG:   10/21/22: Normal sinus rhythm, left axis deviation, no ST abnormalities  Recent Labs: 10/16/2022: ALT 18; BUN 19; Creatinine, Ser 0.88; Hemoglobin 14.0; Platelets 242; Potassium 4.4; Sodium 140; TSH 2.660  Recent Lipid Panel    Component Value Date/Time   CHOL 163 10/16/2022 0937   TRIG 63 10/16/2022 0937   HDL 70 10/16/2022 0937   CHOLHDL 2.3 10/16/2022 0937   CHOLHDL 3 08/14/2012 1158   VLDL 13.0 08/14/2012 1158   LDLCALC 81 10/16/2022 0937    Physical Exam:    VS:  BP 124/66 (BP Location: Left Arm, Patient Position: Sitting, Cuff Size: Normal)   Pulse 69   Ht 5\' 9"  (1.753 m)   Wt 233 lb 12.8 oz (106.1 kg)   SpO2 92%   BMI 34.53 kg/m     Wt Readings from Last 3 Encounters:  02/21/23 233 lb 12.8 oz (106.1 kg)  02/18/23 241 lb (109.3 kg)  12/10/22 228 lb (103.4 kg)     GEN:  Well nourished, well developed in no acute distress HEENT: Normal NECK: No JVD; No carotid bruits LYMPHATICS: No lymphadenopathy CARDIAC: RRR, no murmurs, rubs, gallops RESPIRATORY: Scattered expiratory wheezing ABDOMEN:  Soft, non-tender, non-distended MUSCULOSKELETAL:  No edema; No deformity  SKIN: Warm and dry NEUROLOGIC:  Alert and oriented x 3 PSYCHIATRIC:  Normal affect   ASSESSMENT:    1. Coronary artery disease involving native coronary artery of native heart without angina pectoris   2. Hyperlipidemia, unspecified hyperlipidemia type     PLAN:    CAD: CT chest for lung cancer screening 07/2022 noted to have coronary calcifications.  He reported atypical chest pain.  Also noted to have dyspnea with minimal exertion, which could represent anginal equivalent.  Echocardiogram 11/14/2022 showed EF 60 to 65%, grade 1 diastolic dysfunction, normal RV function, no significant valvular disease.  Coronary CTA 11/19/2022 showed nonobstructive CAD with mild stenosis in proximal to mid LAD, calcium score 507 (63rd percentile).  Hyperlipidemia: LDL 81 on 10/16/2022.  Goal LDL less than 70 given coronary calcifications as above.  Started atorvastatin 20 mg daily.  Check lipid panel  OSA: on CPAP, reports compliance  RTC in 6 months   Medication Adjustments/Labs and Tests Ordered: Current medicines are reviewed at length with the patient today.  Concerns regarding medicines are outlined above.  Orders Placed This Encounter  Procedures   Lipid panel   No orders of the defined types were placed in this encounter.   Patient Instructions  Medication Instructions:  The Atorvastatin 20mg  has been added back to your medication list.   *If you need a refill on your cardiac medications before your next appointment, please call your pharmacy*   Lab Work: Lipid Panel If you have labs (blood work) drawn today and your tests are completely normal, you will receive your results only by: MyChart Message (if you have MyChart) OR A paper copy in the mail If you have any lab test that is abnormal or we need to change your treatment, we will call you to review the  results.   Testing/Procedures: None  Follow-Up: At [facility], you and your health needs are our priority.  As part of our continuing mission to provide you with exceptional heart care, we have created designated Provider Care Teams.  These Care Teams include your primary Cardiologist (physician) and Advanced Practice Providers (APPs -  Physician Assistants and Nurse Practitioners) who all work together to provide you with the care you need, when you need it.  We recommend signing up for the patient portal called "MyChart".  Sign up information is provided on this After Visit Summary.  MyChart is used to connect with patients for Virtual Visits (Telemedicine).  Patients are able to view lab/test results, encounter notes, upcoming appointments, etc.  Non-urgent messages can be sent to your provider as well.   To learn more about what you can do with MyChart, go to [email or web address].    Your next appointment:   6 month(s)  A letter will be mailed to you as a reminder to call the office for your follow up appointment.   Provider:   Dr. Epifanio Lesches     Signed, Little Ishikawa, MD  02/21/2023 12:26 PM    [facility]

## 2023-02-21 ENCOUNTER — Encounter: Payer: Self-pay | Admitting: Cardiology

## 2023-02-21 ENCOUNTER — Ambulatory Visit: Payer: Medicare HMO | Attending: Cardiology | Admitting: Cardiology

## 2023-02-21 VITALS — BP 124/66 | HR 69 | Ht 69.0 in | Wt 233.8 lb

## 2023-02-21 DIAGNOSIS — E785 Hyperlipidemia, unspecified: Secondary | ICD-10-CM

## 2023-02-21 DIAGNOSIS — I2510 Atherosclerotic heart disease of native coronary artery without angina pectoris: Secondary | ICD-10-CM | POA: Diagnosis not present

## 2023-02-21 NOTE — Patient Instructions (Signed)
Medication Instructions:  The Atorvastatin 20mg  has been added back to your medication list.   *If you need a refill on your cardiac medications before your next appointment, please call your pharmacy*   Lab Work: Lipid Panel If you have labs (blood work) drawn today and your tests are completely normal, you will receive your results only by: MyChart Message (if you have MyChart) OR A paper copy in the mail If you have any lab test that is abnormal or we need to change your treatment, we will call you to review the results.   Testing/Procedures: None   Follow-Up: At [facility], you and your health needs are our priority.  As part of our continuing mission to provide you with exceptional heart care, we have created designated Provider Care Teams.  These Care Teams include your primary Cardiologist (physician) and Advanced Practice Providers (APPs -  Physician Assistants and Nurse Practitioners) who all work together to provide you with the care you need, when you need it.  We recommend signing up for the patient portal called "MyChart".  Sign up information is provided on this After Visit Summary.  MyChart is used to connect with patients for Virtual Visits (Telemedicine).  Patients are able to view lab/test results, encounter notes, upcoming appointments, etc.  Non-urgent messages can be sent to your provider as well.   To learn more about what you can do with MyChart, go to [email or web address].    Your next appointment:   6 month(s)  A letter will be mailed to you as a reminder to call the office for your follow up appointment.   Provider:   Dr. Epifanio Lesches

## 2023-02-22 LAB — LIPID PANEL
Chol/HDL Ratio: 1.9 ratio (ref 0.0–5.0)
Cholesterol, Total: 142 mg/dL (ref 100–199)
HDL: 74 mg/dL (ref >39)
LDL Chol Calc (NIH): 58 mg/dL (ref 0–99)
Triglycerides: 41 mg/dL (ref 0–149)
VLDL Cholesterol Cal: 10 mg/dL (ref 5–40)

## 2023-03-07 ENCOUNTER — Telehealth (HOSPITAL_COMMUNITY): Payer: Self-pay

## 2023-03-07 ENCOUNTER — Encounter (HOSPITAL_COMMUNITY): Payer: Self-pay

## 2023-03-07 ENCOUNTER — Telehealth (HOSPITAL_COMMUNITY): Payer: Self-pay | Admitting: *Deleted

## 2023-03-07 NOTE — Telephone Encounter (Signed)
Received [facility] - [id number] from Dr. Carlisle Cater for this pt to participate in pulmonary rehab With the diagnosis of COPD Stage 2.  Reviewed pulmonary clinic notes from 11/4.  Full PFT completed 10/2022 which showed FEV1/FVC 48 and post BD FEV1 62.6.  Previously participate in 2018.  Ok to proceed with scheduling for pulmonary rehab upon the return call. Forward to support staff. Alanson Aly, BSN Cardiac and Emergency planning/management officer

## 2023-03-07 NOTE — Telephone Encounter (Signed)
Attempted to call patient in regards to Pulmonary Rehab - LM on VM Mailed letter 

## 2023-03-07 NOTE — Telephone Encounter (Signed)
Outside/paper referral received by Dr. Charline Bills from the [place].

## 2023-03-11 ENCOUNTER — Telehealth (HOSPITAL_COMMUNITY): Payer: Self-pay | Admitting: *Deleted

## 2023-03-11 ENCOUNTER — Telehealth (HOSPITAL_COMMUNITY): Payer: Self-pay

## 2023-03-11 NOTE — Telephone Encounter (Signed)
Pt returned PR phone call and stated he is interested in PR. Patient will come in for orientation on 03/14/23 at 10:30AM and will attend the 10:15AM exercise class.

## 2023-03-11 NOTE — Telephone Encounter (Signed)
Received message left on departmental voicemail for pulmonary rehab that this veteran wishes to schedule.  Forward ppwk to support staff for creating referral and scheduling. Alanson Aly, BSN Cardiac and Emergency planning/management officer

## 2023-03-14 ENCOUNTER — Encounter (HOSPITAL_COMMUNITY): Payer: Self-pay

## 2023-03-14 ENCOUNTER — Encounter (HOSPITAL_COMMUNITY)
Admission: RE | Admit: 2023-03-14 | Discharge: 2023-03-14 | Disposition: A | Discharge status: Home / Self Care | Payer: No Typology Code available for payment source | Source: Ambulatory Visit | Attending: Pulmonary Disease | Admitting: Pulmonary Disease

## 2023-03-14 VITALS — BP 142/70 | HR 61 | Wt 239.4 lb

## 2023-03-14 DIAGNOSIS — J449 Chronic obstructive pulmonary disease, unspecified: Secondary | ICD-10-CM | POA: Diagnosis present

## 2023-03-14 HISTORY — PX: TRIGGER FINGER RELEASE: SHX641

## 2023-03-14 HISTORY — DX: Atherosclerotic heart disease of native coronary artery without angina pectoris: I25.10

## 2023-03-14 HISTORY — DX: Hyperlipidemia, unspecified: E78.5

## 2023-03-14 NOTE — Progress Notes (Signed)
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)  Patient's Home Medications on Admission:   Current Outpatient Medications:    albuterol (VENTOLIN HFA) 108 (90 Base) MCG/ACT inhaler, Inhale 2 puffs into the lungs every 6 (six) hours as needed., Disp: 18 g, Rfl: 2   ammonium lactate (LAC-HYDRIN) 12 % lotion, Apply 1 Application topically as needed for dry skin., Disp: , Rfl:    aspirin 81 MG chewable tablet, Chew 81 mg by mouth daily., Disp: , Rfl:    atorvastatin (LIPITOR) 20 MG tablet, Take 20 mg by mouth daily., Disp: , Rfl:    carboxymethylcellulose (REFRESH PLUS) 0.5 % SOLN, INSTILL 1 DROP IN BOTH EYES FOUR TIMES A DAY AS NEEDED, Disp: , Rfl:    citalopram (CELEXA) 10 MG tablet, Take 1 tablet (10 mg total) by mouth daily., Disp: 90 tablet, Rfl: 1   cyclobenzaprine (FLEXERIL) 10 MG tablet, Take 1 tablet (10 mg total) by mouth 2 (two) times daily as needed for up to 20 doses for muscle spasms., Disp: 20 tablet, Rfl: 0   diclofenac Sodium (VOLTAREN) 1 % GEL, Apply 2 g topically 4 (four) times daily., Disp: , Rfl:    famotidine (PEPCID) 20 MG tablet, One after supper, Disp: 30 tablet, Rfl: 11   fluticasone (FLONASE) 50 MCG/ACT nasal spray, Place 1-2 sprays into both nostrils daily., Disp: 16 g, Rfl: 2   hydrocortisone 2.5 % cream, APPLY SMALL AMOUNT TO AFFECTED AREA TWICE A DAY AS NEEDED -MIX WITH KETOCONAZOLE CREAM AND APPLY TO THE RED AREAS UNDER ARMPITS AND  IN THE GROIN TWICE  DAILY AS NEEDED -MIX WITH KETOCONAZOLE CREAM AND APPLY TO THE RED AREAS UNDER ARMPITS AND   IN THE GROIN TWICE DAILY AS NEEDED, Disp: , Rfl:    ibuprofen (ADVIL) 600 MG tablet, Take 600 mg by mouth every 6 (six) hours as needed., Disp: , Rfl:    ketoconazole (NIZORAL) 2 % cream, APPLY SMALL AMOUNT TO AFFECTED AREA TWICE A DAY -MIX WITH HYDROCORTISONE CREAM AND APPLY TO THE RED AREAS IN THE GROIN AND  ARMPIT TWICE DAILY AS NEEDED -MIX WITH HYDROCORTISONE CREAM AND APPLY TO THE RED AREAS IN THE GROIN AND  ARMPIT TWICE DAILY AS NEEDED, Disp: , Rfl:    latanoprost (XALATAN) 0.005 % ophthalmic solution, 1 drop at bedtime., Disp: , Rfl:    Multiple Vitamin (MULTIVITAMIN) capsule, Take 1 capsule by mouth daily., Disp: , Rfl:    omeprazole (PRILOSEC) 40 MG capsule, Take 30-60 min before first meal of the day, Disp: 30 capsule, Rfl: 11   terazosin (HYTRIN) 5 MG capsule, Take 5 mg by mouth at bedtime., Disp: , Rfl:    Tiotropium Bromide-Olodaterol (STIOLTO RESPIMAT) 2.5-2.5 MCG/ACT AERS, Inhale 2 puffs into the lungs daily., Disp: , Rfl:    traZODone (DESYREL) 50 MG tablet, Take 1 tablet (50 mg total) by mouth at bedtime as needed for sleep., Disp: 90 tablet, Rfl: 1   triamcinolone cream (KENALOG) 0.5 %, Apply 1 Application topically  3 (three) times daily., Disp: 30 g, Rfl: 2   cetirizine (ZYRTEC) 10 MG tablet, Take by mouth. (Patient not taking: Reported on 03/14/2023), Disp: , Rfl:    montelukast (SINGULAIR) 10 MG tablet, Take 1 tablet (10 mg total) by mouth at bedtime. (Patient not taking: Reported on 03/14/2023), Disp: 30 tablet, Rfl: 5  Past Medical History: Past Medical History:  Diagnosis Date   Agent orange exposure    COPD (chronic obstructive pulmonary disease) (HCC)    Coronary artery disease    Hyperlipidemia    PTSD (post-traumatic stress disorder)    Sleep apnea     Tobacco Use: Social History   Tobacco Use  Smoking Status Former   Current packs/day: 0.00   Average packs/day: 1  pack/day for 40.0 years (40.0 ttl pk-yrs)   Types: Cigarettes   Start date: 12/31/1968   Quit date: 12/31/2008   Years since quitting: 14.2  Smokeless Tobacco Never  Tobacco Comments   10 cigs per day    Labs: Review Flowsheet  More data may exist      Latest Ref Rng & Units 12/25/2007 08/14/2012 09/07/2021 10/16/2022 02/21/2023  Labs for ITP Cardiac and Pulmonary Rehab  Cholestrol 100 - 199 mg/dL 295  284  132  440  102   LDL (calc) 0 - 99 mg/dL 725  98  80  81  58   HDL-C >39 mg/dL 36.6  44.03  67  70  74   Trlycerides 0 - 149 mg/dL 52  47.4  78  63  41   Hemoglobin A1c 4.8 - 5.6 % - - 5.4  5.5  -    Details            Capillary Blood Glucose: No results found for: "GLUCAP"   Pulmonary Assessment Scores:  Pulmonary Assessment Scores     Row Name 03/14/23 1050         ADL UCSD   ADL Phase Entry     SOB Score total 64       CAT Score   CAT Score 26       mMRC Score   mMRC Score 2             UCSD: Self-administered rating of dyspnea associated with activities of daily living (ADLs) 6-point scale (0 = "not at all" to 5 = "maximal or unable to do because of breathlessness")  Scoring Scores range from 0 to 120.  Minimally important difference is 5 units  CAT: CAT can identify the health impairment of COPD patients and is better correlated with disease progression.  CAT has a scoring range of zero to 40. The CAT score is classified into four groups of low (less than 10), medium (10 - 20), high (21-30) and very high (31-40) based on the impact level of disease on health status. A CAT score over 10 suggests significant symptoms.  A worsening CAT score could be explained by an exacerbation, poor medication adherence, poor inhaler technique, or progression of COPD or comorbid conditions.  CAT MCID is 2 points  mMRC: mMRC (Modified Medical Research Council) Dyspnea Scale is used to assess the degree of baseline functional disability in patients of respiratory disease  due to dyspnea. No minimal important difference is established. A decrease in score of 1 point or greater is considered a positive change.   Pulmonary Function Assessment:  Pulmonary Function Assessment - 03/14/23 1030       Breath   Bilateral Breath Sounds Clear;Decreased  Shortness of Breath Yes;Limiting activity             Exercise Target Goals: Exercise Program Goal: Individual exercise prescription set using results from initial 6 min walk test and THRR while considering  patient's activity barriers and safety.   Exercise Prescription Goal: Initial exercise prescription builds to 30-45 minutes a day of aerobic activity, 2-3 days per week.  Home exercise guidelines will be given to patient during program as part of exercise prescription that the participant will acknowledge.  Activity Barriers & Risk Stratification:  Activity Barriers & Cardiac Risk Stratification - 03/14/23 1029       Activity Barriers & Cardiac Risk Stratification   Activity Barriers Deconditioning;Muscular Weakness;Shortness of Breath;Arthritis    Cardiac Risk Stratification Moderate             6 Minute Walk:  6 Minute Walk     Row Name 03/14/23 1111         6 Minute Walk   Phase Initial     Distance 1280 feet     Walk Time 6 minutes     # of Rest Breaks 0     MPH 2.42     METS 10.95     RPE 12     Perceived Dyspnea  3     VO2 Peak 6.84     Symptoms No     Resting HR 61 bpm     Resting BP 142/70     Resting Oxygen Saturation  95 %     Exercise Oxygen Saturation  during 6 min walk 89 %     Max Ex. HR 79 bpm     Max Ex. BP 152/68     2 Minute Post BP 128/70       Interval HR   1 Minute HR 63     2 Minute HR 63     3 Minute HR 65     4 Minute HR 63     5 Minute HR 63     6 Minute HR 79     2 Minute Post HR 64     Interval Heart Rate? Yes       Interval Oxygen   Interval Oxygen? Yes     Baseline Oxygen Saturation % 95 %     1 Minute Oxygen Saturation % 93 %     1  Minute Liters of Oxygen 0 L     2 Minute Oxygen Saturation % 89 %     2 Minute Liters of Oxygen 0 L     3 Minute Oxygen Saturation % 90 %     3 Minute Liters of Oxygen 0 L     4 Minute Oxygen Saturation % 90 %     4 Minute Liters of Oxygen 0 L     5 Minute Oxygen Saturation % 91 %     5 Minute Liters of Oxygen 0 L     6 Minute Oxygen Saturation % 90 %     6 Minute Liters of Oxygen 0 L     2 Minute Post Oxygen Saturation % 95 %     2 Minute Post Liters of Oxygen 0 L              Oxygen Initial Assessment:  Oxygen Initial Assessment - 03/14/23 1030       Home Oxygen   Home Oxygen Device None    Sleep Oxygen Prescription CPAP    Home Exercise  Oxygen Prescription None    Home Resting Oxygen Prescription None      Initial 6 min Walk   Oxygen Used None      Program Oxygen Prescription   Program Oxygen Prescription None      Intervention   Short Term Goals To learn and understand importance of monitoring SPO2 with pulse oximeter and demonstrate accurate use of the pulse oximeter.;To learn and understand importance of maintaining oxygen saturations>88%;To learn and demonstrate proper use of respiratory medications;To learn and demonstrate proper pursed lip breathing techniques or other breathing techniques.     Long  Term Goals Maintenance of O2 saturations>88%;Compliance with respiratory medication;Verbalizes importance of monitoring SPO2 with pulse oximeter and return demonstration;Exhibits proper breathing techniques, such as pursed lip breathing or other method taught during program session;Demonstrates proper use of MDI's             Oxygen Re-Evaluation:  Oxygen Re-Evaluation     Row Name 03/14/23 1030             Goals/Expected Outcomes   Goals/Expected Outcomes Compliance and understanding of oxygen saturation monitoring and breathing techniques to decrease shortness of breath.                Oxygen Discharge (Final Oxygen Re-Evaluation):  Oxygen  Re-Evaluation - 03/14/23 1030       Goals/Expected Outcomes   Goals/Expected Outcomes Compliance and understanding of oxygen saturation monitoring and breathing techniques to decrease shortness of breath.             Initial Exercise Prescription:  Initial Exercise Prescription - 03/14/23 1100       Date of Initial Exercise RX and Referring Provider   Date 03/14/23    Referring Provider Vivia Budge   Expected Discharge Date 06/04/22      Treadmill   MPH 1.8    Grade 0    Minutes 15      Recumbant Elliptical   Level 2    RPM 60    Watts 40    Minutes 15      Prescription Details   Frequency (times per week) 2    Duration Progress to 30 minutes of continuous aerobic without signs/symptoms of physical distress      Intensity   THRR 40-80% of Max Heartrate 57-114    Ratings of Perceived Exertion 11-13    Perceived Dyspnea 0-4      Progression   Progression Continue progressive overload as per policy without signs/symptoms or physical distress.      Resistance Training   Training Prescription Yes    Weight blue bands    Reps 10-15             Perform Capillary Blood Glucose checks as needed.  Exercise Prescription Changes:   Exercise Comments:   Exercise Goals and Review:   Exercise Goals     Row Name 03/14/23 1029             Exercise Goals   Increase Physical Activity Yes       Intervention Provide advice, education, support and counseling about physical activity/exercise needs.;Develop an individualized exercise prescription for aerobic and resistive training based on initial evaluation findings, risk stratification, comorbidities and participant's personal goals.       Expected Outcomes Short Term: Attend rehab on a regular basis to increase amount of physical activity.;Long Term: Add in home exercise to make exercise part of routine and to increase amount of physical activity.;Long Term: Exercising  regularly at least 3-5 days a week.        Increase Strength and Stamina Yes       Intervention Provide advice, education, support and counseling about physical activity/exercise needs.;Develop an individualized exercise prescription for aerobic and resistive training based on initial evaluation findings, risk stratification, comorbidities and participant's personal goals.       Expected Outcomes Short Term: Increase workloads from initial exercise prescription for resistance, speed, and METs.;Short Term: Perform resistance training exercises routinely during rehab and add in resistance training at home;Long Term: Improve cardiorespiratory fitness, muscular endurance and strength as measured by increased METs and functional capacity ( )       Able to understand and use rate of perceived exertion (RPE) scale Yes       Intervention Provide education and explanation on how to use RPE scale       Expected Outcomes Short Term: Able to use RPE daily in rehab to express subjective intensity level;Long Term:  Able to use RPE to guide intensity level when exercising independently       Able to understand and use Dyspnea scale Yes       Intervention Provide education and explanation on how to use Dyspnea scale       Expected Outcomes Short Term: Able to use Dyspnea scale daily in rehab to express subjective sense of shortness of breath during exertion;Long Term: Able to use Dyspnea scale to guide intensity level when exercising independently       Knowledge and understanding of Target Heart Rate Range (THRR) Yes       Intervention Provide education and explanation of THRR including how the numbers were predicted and where they are located for reference       Expected Outcomes Short Term: Able to state/look up THRR;Long Term: Able to use THRR to govern intensity when exercising independently;Short Term: Able to use daily as guideline for intensity in rehab       Understanding of Exercise Prescription Yes       Intervention Provide education,  explanation, and written materials on patient's individual exercise prescription       Expected Outcomes Short Term: Able to explain program exercise prescription;Long Term: Able to explain home exercise prescription to exercise independently                Exercise Goals Re-Evaluation :  Exercise Goals Re-Evaluation     Row Name 03/14/23 1113             Exercise Goal Re-Evaluation   Exercise Goals Review Increase Physical Activity;Able to understand and use Dyspnea scale;Understanding of Exercise Prescription;Increase Strength and Stamina;Knowledge and understanding of Target Heart Rate Range (THRR);Able to understand and use rate of perceived exertion (RPE) scale       Comments Tiburcio Pea is scheduled to exercise 11/26. Will continue to monitor and progress as able.       Expected Outcomes Through exercise at rehab and home, the patient will decrease shortness of breath with daily activities and feel confident in carrying out an exercise regimen at home.                Discharge Exercise Prescription (Final Exercise Prescription Changes):   Nutrition:  Target Goals: Understanding of nutrition guidelines, daily intake of sodium 1500mg , cholesterol 200mg , calories 30% from fat and 7% or less from saturated fats, daily to have 5 or more servings of fruits and vegetables.  Biometrics:  Pre Biometrics - 03/14/23 1138  Pre Biometrics   Grip Strength 32 kg              Nutrition Therapy Plan and Nutrition Goals:   Nutrition Assessments:  MEDIFICTS Score Key: >=70 Need to make dietary changes  40-70 Heart Healthy Diet <= 40 Therapeutic Level Cholesterol Diet   Picture Your Plate Scores: <16 Unhealthy dietary pattern with much room for improvement. 41-50 Dietary pattern unlikely to meet recommendations for good health and room for improvement. 51-60 More healthful dietary pattern, with some room for improvement.  >60 Healthy dietary pattern, although there  may be some specific behaviors that could be improved.    Nutrition Goals Re-Evaluation:   Nutrition Goals Discharge (Final Nutrition Goals Re-Evaluation):   Psychosocial: Target Goals: Acknowledge presence or absence of significant depression and/or stress, maximize coping skills, provide positive support system. Participant is able to verbalize types and ability to use techniques and skills needed for reducing stress and depression.  Initial Review & Psychosocial Screening:  Initial Psych Review & Screening - 03/14/23 1025       Initial Review   Current issues with Current Depression;Current Psychotropic Meds;Current Stress Concerns    Source of Stress Concerns Family    Comments Pt is dealing with grief from recently losing one of his daughters. He is also dealing with family stress that has to do with his other daughter.      Family Dynamics   Good Support System? Yes    Concerns Recent loss of child      Barriers   Psychosocial barriers to participate in program The patient should benefit from training in stress management and relaxation.;Psychosocial barriers identified (see note)      Screening Interventions   Interventions Encouraged to exercise;To provide support and resources with identified psychosocial needs    Expected Outcomes Short Term goal: Utilizing psychosocial counselor, staff and physician to assist with identification of specific Stressors or current issues interfering with healing process. Setting desired goal for each stressor or current issue identified.;Long Term Goal: Stressors or current issues are controlled or eliminated.;Short Term goal: Identification and review with participant of any Quality of Life or Depression concerns found by scoring the questionnaire.;Long Term goal: The participant improves quality of Life and PHQ9 Scores as seen by post scores and/or verbalization of changes             Quality of Life Scores:  Scores of 19 and below  usually indicate a poorer quality of life in these areas.  A difference of  2-3 points is a clinically meaningful difference.  A difference of 2-3 points in the total score of the Quality of Life Index has been associated with significant improvement in overall quality of life, self-image, physical symptoms, and general health in studies assessing change in quality of life.  PHQ-9: Review Flowsheet  More data exists      03/14/2023 12/10/2022 10/15/2022 07/15/2022 03/19/2022  Depression screen PHQ 2/9  Decreased Interest 0 0 0 0 0 0  Down, Depressed, Hopeless 1 0 0 0 0 0  PHQ - 2 Score 1 0 0 0 0 0  Altered sleeping 1 0 0 0 0 0  Tired, decreased energy 1 0 0 3 0 0  Change in appetite 1 0 0 1 0 0  Feeling bad or failure about yourself  0 0 0 0 0 0  Trouble concentrating 0 0 0 0 0 0  Moving slowly or fidgety/restless 0 0 0 0 0 0  Suicidal thoughts  0 0 0 0 0 0  PHQ-9 Score 4 0 0 4 0 0  Difficult doing work/chores Not difficult at all Not difficult at all Not difficult at all Not difficult at all - -    Details       Multiple values from one day are sorted in reverse-chronological order        Interpretation of Total Score  Total Score Depression Severity:  1-4 = Minimal depression, 5-9 = Mild depression, 10-14 = Moderate depression, 15-19 = Moderately severe depression, 20-27 = Severe depression   Psychosocial Evaluation and Intervention:  Psychosocial Evaluation - 03/14/23 1026       Psychosocial Evaluation & Interventions   Interventions Encouraged to exercise with the program and follow exercise prescription;Stress management education    Comments Pt is dealing with grief from recently losing one of his daughters. He is also dealing with family stress that has to do with his other daughter. He is currently on psychotropic meds and is compliant with taking them. He also see's a counseler for his PTSD.    Expected Outcomes For Yashar's depression and stress to be decreased during  the PR program    Continue Psychosocial Services  Follow up required by staff             Psychosocial Re-Evaluation:   Psychosocial Discharge (Final Psychosocial Re-Evaluation):   Education: Education Goals: Education classes will be provided on a weekly basis, covering required topics. Participant will state understanding/return demonstration of topics presented.  Learning Barriers/Preferences:  Learning Barriers/Preferences - 03/14/23 1026       Learning Barriers/Preferences   Learning Barriers Sight;Hearing   wears glasses and hearing aids   Learning Preferences Computer/Internet;Group Instruction;Individual Instruction;Skilled Demonstration;Written Material             Education Topics: Know Your Numbers Group instruction that is supported by a PowerPoint presentation. Instructor discusses importance of knowing and understanding resting, exercise, and post-exercise oxygen saturation, heart rate, and blood pressure. Oxygen saturation, heart rate, blood pressure, rating of perceived exertion, and dyspnea are reviewed along with a normal range for these values.    Exercise for the Pulmonary Patient Group instruction that is supported by a PowerPoint presentation. Instructor discusses benefits of exercise, core components of exercise, frequency, duration, and intensity of an exercise routine, importance of utilizing pulse oximetry during exercise, safety while exercising, and options of places to exercise outside of rehab.  Flowsheet Row PULMONARY REHAB OTHER RESPIRATORY from 09/12/2016 in [facility] for Heart, Vascular, & Lung Health  Date 08/08/16  Educator EP  Instruction Review Code (Retired) R- Review/reinforce       MET Level  Group instruction provided by PowerPoint, verbal discussion, and written material to support subject matter. Instructor reviews what METs are and how to increase METs.    Pulmonary Medications Verbally  interactive group education provided by instructor with focus on inhaled medications and proper administration. Flowsheet Row PULMONARY REHAB OTHER RESPIRATORY from 09/12/2016 in [facility] for Heart, Vascular, & Lung Health  Date 09/05/16  Educator Pharm D  Instruction Review Code (Retired) 2- meets Chief Operating Officer and Physiology of the Respiratory System Group instruction provided by PowerPoint, verbal discussion, and written material to support subject matter. Instructor reviews respiratory cycle and anatomical components of the respiratory system and their functions. Instructor also reviews differences in obstructive and restrictive respiratory diseases with examples of each.  Flowsheet Row PULMONARY REHAB OTHER  RESPIRATORY from 09/12/2016 in [facility] for Heart, Vascular, & Lung Health  Date 08/29/16  Educator RN  Instruction Review Code (Retired) 2- meets goals/outcomes       Oxygen Safety Group instruction provided by PowerPoint, verbal discussion, and written material to support subject matter. There is an overview of "What is Oxygen" and "Why do we need it".  Instructor also reviews how to create a safe environment for oxygen use, the importance of using oxygen as prescribed, and the risks of noncompliance. There is a brief discussion on traveling with oxygen and resources the patient may utilize. Flowsheet Row PULMONARY REHAB OTHER RESPIRATORY from 09/12/2016 in [facility] for Heart, Vascular, & Lung Health  Date 07/25/16  Educator Daniel Nones  Instruction Review Code (Retired) 2- meets goals/outcomes       Oxygen Use Group instruction provided by PowerPoint, verbal discussion, and written material to discuss how supplemental oxygen is prescribed and different types of oxygen supply systems. Resources for more information are provided.    Breathing Techniques Group instruction that is  supported by demonstration and informational handouts. Instructor discusses the benefits of pursed lip and diaphragmatic breathing and detailed demonstration on how to perform both.     Risk Factor Reduction Group instruction that is supported by a PowerPoint presentation. Instructor discusses the definition of a risk factor, different risk factors for pulmonary disease, and how the heart and lungs work together. Flowsheet Row PULMONARY REHAB OTHER RESPIRATORY from 09/12/2016 in [facility] for Heart, Vascular, & Lung Health  Date 07/11/16  Educator EP  Instruction Review Code (Retired) 2- meets goals/outcomes       Pulmonary Diseases Group instruction provided by PowerPoint, verbal discussion, and written material to support subject matter. Instructor gives an overview of the different type of pulmonary diseases. There is also a discussion on risk factors and symptoms as well as ways to manage the diseases.   Stress and Energy Conservation Group instruction provided by PowerPoint, verbal discussion, and written material to support subject matter. Instructor gives an overview of stress and the impact it can have on the body. Instructor also reviews ways to reduce stress. There is also a discussion on energy conservation and ways to conserve energy throughout the day.   Warning Signs and Symptoms Group instruction provided by PowerPoint, verbal discussion, and written material to support subject matter. Instructor reviews warning signs and symptoms of stroke, heart attack, cold and flu. Instructor also reviews ways to prevent the spread of infection.   Other Education Group or individual verbal, written, or video instructions that support the educational goals of the pulmonary rehab program.    Knowledge Questionnaire Score:  Knowledge Questionnaire Score - 03/14/23 1051       Knowledge Questionnaire Score   Pre Score 16/18             Core  Components/Risk Factors/Patient Goals at Admission:  Personal Goals and Risk Factors at Admission - 03/14/23 1027       Core Components/Risk Factors/Patient Goals on Admission   Improve shortness of breath with ADL's Yes    Intervention Provide education, individualized exercise plan and daily activity instruction to help decrease symptoms of SOB with activities of daily living.    Expected Outcomes Short Term: Improve cardiorespiratory fitness to achieve a reduction of symptoms when performing ADLs;Long Term: Be able to perform more ADLs without symptoms or delay the onset of symptoms    Increase knowledge of respiratory  medications and ability to use respiratory devices properly  Yes    Intervention Provide education and demonstration as needed of appropriate use of medications, inhalers, and oxygen therapy.    Expected Outcomes Short Term: Achieves understanding of medications use. Understands that oxygen is a medication prescribed by physician. Demonstrates appropriate use of inhaler and oxygen therapy.;Long Term: Maintain appropriate use of medications, inhalers, and oxygen therapy.             Core Components/Risk Factors/Patient Goals Review:    Core Components/Risk Factors/Patient Goals at Discharge (Final Review):    ITP Comments:   Comments: Dr. Mechele Collin is Medical Director for Pulmonary Rehab at [facility].

## 2023-03-14 NOTE — Progress Notes (Signed)
Arthur Harper 77 y.o. male  Initial Psychosocial Assessment  Pt psychosocial assessment reveals pt lives with their partner. Pt is currently retired. Pt hobbies include  boating, working in the yard, and cutting wood. . Pt reports his  stress level is moderate. Areas of stress/anxiety include family .  Pt does exhibit signs of depression. Signs of depression include sadness and difficulty maintaining sleep. Pt shows good  coping skills with positive outlook . Offered emotional support and reassurance. Monitor and evaluate progress toward psychosocial goal(s).  Goal(s): Improved management of stress and depression Improved coping skills Help patient work toward returning to meaningful activities that improve patient's QOL and are attainable with patient's lung disease   03/14/2023 11:00 AM

## 2023-03-14 NOTE — Progress Notes (Signed)
Arthur Harper 77 y.o. male Pulmonary Rehab Orientation Note This patient who was referred to Pulmonary Rehab by Dr. Charline Bills with the diagnosis of COPD 2 arrived today in Cardiac and Pulmonary Rehab. He arrived ambulatory with normal gait. He does not carry portable oxygen. Per patient, Arthur Harper uses oxygen never. Color good, skin warm and dry. Patient is oriented to time and place. Patient's medical history, psychosocial health, and medications reviewed. Psychosocial assessment reveals patient lives with his significant other. Arthur Harper is currently retired. Patient hobbies include  working in the yard, boating, and cutting wood. . Patient reports his stress level is moderate. Areas of stress/anxiety include family . Patient does exhibit signs of depression. Signs of depression include sadness and difficulty maintaining sleep. PHQ2/9 score 1/4. Arthur Harper shows good  coping skills with positive outlook on life. Offered emotional support and reassurance. Will continue to monitor. Physical assessment performed by Nurse pick: Essie Hart RN. Please see their orientation physical assessment note. Arthur Harper reports he  does take medications as prescribed. Patient states he  follows a regular  diet. The patient reports no specific efforts to gain or lose weight.. Patient's weight will be monitored closely. Demonstration and practice of PLB using pulse oximeter. Arthur Harper able to return demonstration satisfactorily. Safety and hand hygiene in the exercise area reviewed with patient. Arthur Harper voices understanding of the information reviewed. Department expectations discussed with patient and achievable goals were set. The patient shows enthusiasm about attending the program and we look forward to working with Arthur Harper. Arthur Harper completed a 6 min walk test today and is scheduled to begin exercise on 03/18/23 @10 :15.   [id number] [name], BSRT

## 2023-03-14 NOTE — Progress Notes (Signed)
Pulmonary Rehab Orientation Physical Assessment Note  Physical assessment reveals patient is alert and oriented x 4. Heart rate is normal, distant, breath sounds diminished to auscultation, no wheezes, rales, or rhonchi. Pt reports chronic productive cough with clear/white sputum. Bowel sounds present x4 quads. Pt denies abdominal discomfort, nausea, vomiting, diarrhea or constipation. Grip strength equal, strong. Distal pulses +3; noted to have trigger finger surgery this AM on right thumb, wrapped in guaze. No swelling to lower extremities.   Essie Hart, RN, BSN

## 2023-03-18 ENCOUNTER — Encounter (HOSPITAL_COMMUNITY)
Admission: RE | Admit: 2023-03-18 | Discharge: 2023-03-18 | Disposition: A | Discharge status: Home / Self Care | Payer: No Typology Code available for payment source | Source: Ambulatory Visit | Attending: Pulmonary Disease | Admitting: Pulmonary Disease

## 2023-03-18 VITALS — Wt 236.3 lb

## 2023-03-18 DIAGNOSIS — J449 Chronic obstructive pulmonary disease, unspecified: Secondary | ICD-10-CM | POA: Diagnosis not present

## 2023-03-18 NOTE — Progress Notes (Signed)
Daily Session Note  Patient Details  Name: Arthur Harper MRN: [id number] Date of Birth: Jun 07, 1945 Referring Provider:   Doristine Devoid Pulmonary Rehab Walk Test from 03/14/2023 in [facility]  Referring Provider [name]  [[name]]       Encounter Date: 03/18/2023  Check In:  Session Check In - 03/18/23 1023       Check-In   Supervising physician immediately available to respond to emergencies [facility] MD immediately available    Physician(s) Jari Favre, NP    Location [facility]    Staff Present Raford Pitcher, MS, ACSM-CEP, Exercise Physiologist;Mary Gerre Scull, RN, BSN;Joslynn Jamroz Katrinka Blazing, RT;Randi Reeve BS, ACSM-CEP, Exercise Physiologist    Virtual Visit No    Medication changes reported     No    Fall or balance concerns reported    No    Tobacco Cessation No Change    Warm-up and Cool-down Performed as group-led instruction    Resistance Training Performed Yes    VAD Patient? No    PAD/SET Patient? No      Pain Assessment   Currently in Pain? No/denies    Multiple Pain Sites No             Capillary Blood Glucose: No results found for this or any previous visit (from the past 24 hour(s)).   Exercise Prescription Changes - 03/18/23 1100       Response to Exercise   Blood Pressure (Admit) 128/66    Blood Pressure (Exercise) 126/64    Blood Pressure (Exit) 112/66    Heart Rate (Admit) 71 bpm    Heart Rate (Exercise) 88 bpm    Heart Rate (Exit) 74 bpm    Oxygen Saturation (Admit) 95 %    Oxygen Saturation (Exercise) 95 %    Oxygen Saturation (Exit) 95 %    Rating of Perceived Exertion (Exercise) 13    Perceived Dyspnea (Exercise) 3    Duration Continue with 30 min of aerobic exercise without signs/symptoms of physical distress.    Intensity THRR unchanged      Resistance Training   Training Prescription Yes    Weight blue bands    Reps 10-15    Time 10 Minutes      Treadmill   MPH  1.8    Grade 0    Minutes 15    METs 2.2      Recumbant Elliptical   Level 2    RPM 46    Minutes 15    METs 1.8             Social History   Tobacco Use  Smoking Status Former   Current packs/day: 0.00   Average packs/day: 1 pack/day for 40.0 years (40.0 ttl pk-yrs)   Types: Cigarettes   Start date: 12/31/1968   Quit date: 12/31/2008   Years since quitting: 14.2  Smokeless Tobacco Never  Tobacco Comments   10 cigs per day    Goals Met:  Proper associated with RPD/PD & O2 Sat Independence with exercise equipment Exercise tolerated well No report of concerns or symptoms today Strength training completed today  Goals Unmet:  Not Applicable  Comments: Service time is from 1015 to 1134.    Dr. Mechele Collin is Medical Director for Pulmonary Rehab at [facility].

## 2023-03-19 ENCOUNTER — Telehealth (HOSPITAL_COMMUNITY): Payer: Self-pay

## 2023-03-19 NOTE — Telephone Encounter (Signed)
Ladona Ridgel with [facility] called and asked if we could fax over Roy's documents from his orientation appt. fax# [phone]

## 2023-03-19 NOTE — Progress Notes (Signed)
Pulmonary Individual Treatment Plan  Patient Details  Name: Arthur Harper MRN: [id number] Date of Birth: 1945/06/12 Referring Provider:   Doristine Devoid Pulmonary Rehab Walk Test from 03/14/2023 in [facility]  Referring Provider [name]  [[name]]       Initial Encounter Date:  Flowsheet Row Pulmonary Rehab Walk Test from 03/14/2023 in 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)  Patient's Home Medications on Admission:   Current Outpatient Medications:    albuterol (VENTOLIN HFA) 108 (90 Base) MCG/ACT inhaler, Inhale 2 puffs into the lungs every 6 (six) hours as needed., Disp: 18 g, Rfl: 2   ammonium lactate (LAC-HYDRIN) 12 % lotion, Apply 1 Application topically as needed for dry skin., Disp: , Rfl:    aspirin 81 MG chewable tablet, Chew 81 mg by mouth daily., Disp: , Rfl:    atorvastatin (LIPITOR) 20 MG tablet, Take 20 mg by mouth daily., Disp: , Rfl:    carboxymethylcellulose (REFRESH PLUS) 0.5 % SOLN, INSTILL 1 DROP IN BOTH EYES FOUR TIMES A DAY AS NEEDED, Disp: , Rfl:    cetirizine (ZYRTEC) 10 MG tablet, Take by mouth. (Patient not taking: Reported on 03/14/2023), Disp: , Rfl:    citalopram (CELEXA) 10 MG tablet, Take 1 tablet (10 mg total) by mouth daily., Disp: 90 tablet, Rfl: 1   cyclobenzaprine (FLEXERIL) 10 MG tablet, Take 1 tablet (10 mg total) by mouth 2 (two) times daily as needed for up to 20 doses for muscle spasms., Disp: 20 tablet, Rfl: 0   diclofenac Sodium (VOLTAREN) 1 % GEL, Apply 2 g topically 4 (four) times daily., Disp: , Rfl:    famotidine (PEPCID) 20 MG tablet, One after supper, Disp: 30 tablet, Rfl: 11   fluticasone (FLONASE) 50 MCG/ACT nasal spray, Place 1-2 sprays into both nostrils daily., Disp: 16 g, Rfl: 2   hydrocortisone 2.5 % cream, APPLY SMALL AMOUNT TO AFFECTED AREA  TWICE A DAY AS NEEDED -MIX WITH KETOCONAZOLE CREAM AND APPLY TO THE RED AREAS UNDER ARMPITS AND  IN THE GROIN TWICE DAILY AS NEEDED -MIX WITH KETOCONAZOLE CREAM AND APPLY TO THE RED AREAS UNDER ARMPITS AND   IN THE GROIN TWICE DAILY AS NEEDED, Disp: , Rfl:    ibuprofen (ADVIL) 600 MG tablet, Take 600 mg by mouth every 6 (six) hours as needed., Disp: , Rfl:    ketoconazole (NIZORAL) 2 % cream, APPLY SMALL AMOUNT TO AFFECTED AREA TWICE A DAY -MIX WITH HYDROCORTISONE CREAM AND APPLY TO THE RED AREAS IN THE GROIN AND  ARMPIT TWICE DAILY AS NEEDED -MIX WITH HYDROCORTISONE CREAM AND APPLY TO THE RED AREAS IN THE GROIN AND  ARMPIT TWICE DAILY AS NEEDED, Disp: , Rfl:    latanoprost (XALATAN) 0.005 % ophthalmic solution, 1 drop at bedtime., Disp: , Rfl:    montelukast (SINGULAIR) 10 MG tablet, Take 1 tablet (10 mg total) by mouth at bedtime. (Patient not taking: Reported on 03/14/2023), Disp: 30 tablet, Rfl: 5   Multiple Vitamin (MULTIVITAMIN) capsule, Take 1 capsule by mouth daily., Disp: , Rfl:    omeprazole (PRILOSEC) 40 MG capsule, Take 30-60 min before first meal of the day, Disp: 30 capsule, Rfl: 11   terazosin (HYTRIN) 5 MG capsule, Take 5 mg by mouth at bedtime., Disp: , Rfl:    Tiotropium Bromide-Olodaterol (STIOLTO RESPIMAT) 2.5-2.5 MCG/ACT AERS,  Inhale 2 puffs into the lungs daily., Disp: , Rfl:    traZODone (DESYREL) 50 MG tablet, Take 1 tablet (50 mg total) by mouth at bedtime as needed for sleep., Disp: 90 tablet, Rfl: 1   triamcinolone cream (KENALOG) 0.5 %, Apply 1 Application topically 3 (three) times daily., Disp: 30 g, Rfl: 2  Past Medical History: Past Medical History:  Diagnosis Date   Agent orange exposure    COPD (chronic obstructive pulmonary disease) (HCC)    Coronary artery disease    Hyperlipidemia    PTSD (post-traumatic stress disorder)    Sleep apnea     Tobacco Use: Social History   Tobacco Use  Smoking Status Former   Current packs/day: 0.00   Average packs/day: 1  pack/day for 40.0 years (40.0 ttl pk-yrs)   Types: Cigarettes   Start date: 12/31/1968   Quit date: 12/31/2008   Years since quitting: 14.2  Smokeless Tobacco Never  Tobacco Comments   10 cigs per day    Labs: Review Flowsheet  More data may exist      Latest Ref Rng & Units 12/25/2007 08/14/2012 09/07/2021 10/16/2022 02/21/2023  Labs for ITP Cardiac and Pulmonary Rehab  Cholestrol 100 - 199 mg/dL 161  096  045  409  811   LDL (calc) 0 - 99 mg/dL 914  98  80  81  58   HDL-C >39 mg/dL 78.2  95.62  67  70  74   Trlycerides 0 - 149 mg/dL 52  13.0  78  63  41   Hemoglobin A1c 4.8 - 5.6 % - - 5.4  5.5  -    Details            Capillary Blood Glucose: No results found for: "GLUCAP"   Pulmonary Assessment Scores:  Pulmonary Assessment Scores     Row Name 03/14/23 1050         ADL UCSD   ADL Phase Entry     SOB Score total 64       CAT Score   CAT Score 26       mMRC Score   mMRC Score 2             UCSD: Self-administered rating of dyspnea associated with activities of daily living (ADLs) 6-point scale (0 = "not at all" to 5 = "maximal or unable to do because of breathlessness")  Scoring Scores range from 0 to 120.  Minimally important difference is 5 units  CAT: CAT can identify the health impairment of COPD patients and is better correlated with disease progression.  CAT has a scoring range of zero to 40. The CAT score is classified into four groups of low (less than 10), medium (10 - 20), high (21-30) and very high (31-40) based on the impact level of disease on health status. A CAT score over 10 suggests significant symptoms.  A worsening CAT score could be explained by an exacerbation, poor medication adherence, poor inhaler technique, or progression of COPD or comorbid conditions.  CAT MCID is 2 points  mMRC: mMRC (Modified Medical Research Council) Dyspnea Scale is used to assess the degree of baseline functional disability in patients of respiratory disease  due to dyspnea. No minimal important difference is established. A decrease in score of 1 point or greater is considered a positive change.   Pulmonary Function Assessment:  Pulmonary Function Assessment - 03/14/23 1030       Breath   Bilateral Breath Sounds Clear;Decreased  Shortness of Breath Yes;Limiting activity             Exercise Target Goals: Exercise Program Goal: Individual exercise prescription set using results from initial 6 min walk test and THRR while considering  patient's activity barriers and safety.   Exercise Prescription Goal: Initial exercise prescription builds to 30-45 minutes a day of aerobic activity, 2-3 days per week.  Home exercise guidelines will be given to patient during program as part of exercise prescription that the participant will acknowledge.  Activity Barriers & Risk Stratification:  Activity Barriers & Cardiac Risk Stratification - 03/14/23 1029       Activity Barriers & Cardiac Risk Stratification   Activity Barriers Deconditioning;Muscular Weakness;Shortness of Breath;Arthritis    Cardiac Risk Stratification Moderate             6 Minute Walk:  6 Minute Walk     Row Name 03/14/23 1111         6 Minute Walk   Phase Initial     Distance 1280 feet     Walk Time 6 minutes     # of Rest Breaks 0     MPH 2.42     METS 10.95     RPE 12     Perceived Dyspnea  3     VO2 Peak 6.84     Symptoms No     Resting HR 61 bpm     Resting BP 142/70     Resting Oxygen Saturation  95 %     Exercise Oxygen Saturation  during 6 min walk 89 %     Max Ex. HR 79 bpm     Max Ex. BP 152/68     2 Minute Post BP 128/70       Interval HR   1 Minute HR 63     2 Minute HR 63     3 Minute HR 65     4 Minute HR 63     5 Minute HR 63     6 Minute HR 79     2 Minute Post HR 64     Interval Heart Rate? Yes       Interval Oxygen   Interval Oxygen? Yes     Baseline Oxygen Saturation % 95 %     1 Minute Oxygen Saturation % 93 %     1  Minute Liters of Oxygen 0 L     2 Minute Oxygen Saturation % 89 %     2 Minute Liters of Oxygen 0 L     3 Minute Oxygen Saturation % 90 %     3 Minute Liters of Oxygen 0 L     4 Minute Oxygen Saturation % 90 %     4 Minute Liters of Oxygen 0 L     5 Minute Oxygen Saturation % 91 %     5 Minute Liters of Oxygen 0 L     6 Minute Oxygen Saturation % 90 %     6 Minute Liters of Oxygen 0 L     2 Minute Post Oxygen Saturation % 95 %     2 Minute Post Liters of Oxygen 0 L              Oxygen Initial Assessment:  Oxygen Initial Assessment - 03/14/23 1030       Home Oxygen   Home Oxygen Device None    Sleep Oxygen Prescription CPAP    Home Exercise  Oxygen Prescription None    Home Resting Oxygen Prescription None      Initial 6 min Walk   Oxygen Used None      Program Oxygen Prescription   Program Oxygen Prescription None      Intervention   Short Term Goals To learn and understand importance of monitoring SPO2 with pulse oximeter and demonstrate accurate use of the pulse oximeter.;To learn and understand importance of maintaining oxygen saturations>88%;To learn and demonstrate proper use of respiratory medications;To learn and demonstrate proper pursed lip breathing techniques or other breathing techniques.     Long  Term Goals Maintenance of O2 saturations>88%;Compliance with respiratory medication;Verbalizes importance of monitoring SPO2 with pulse oximeter and return demonstration;Exhibits proper breathing techniques, such as pursed lip breathing or other method taught during program session;Demonstrates proper use of MDI's             Oxygen Re-Evaluation:  Oxygen Re-Evaluation     Row Name 03/14/23 1030             Goals/Expected Outcomes   Goals/Expected Outcomes Compliance and understanding of oxygen saturation monitoring and breathing techniques to decrease shortness of breath.                Oxygen Discharge (Final Oxygen Re-Evaluation):  Oxygen  Re-Evaluation - 03/14/23 1030       Goals/Expected Outcomes   Goals/Expected Outcomes Compliance and understanding of oxygen saturation monitoring and breathing techniques to decrease shortness of breath.             Initial Exercise Prescription:  Initial Exercise Prescription - 03/14/23 1100       Date of Initial Exercise RX and Referring Provider   Date 03/14/23    Referring Provider Vivia Budge   Expected Discharge Date 06/04/22      Treadmill   MPH 1.8    Grade 0    Minutes 15      Recumbant Elliptical   Level 2    RPM 60    Watts 40    Minutes 15      Prescription Details   Frequency (times per week) 2    Duration Progress to 30 minutes of continuous aerobic without signs/symptoms of physical distress      Intensity   THRR 40-80% of Max Heartrate 57-114    Ratings of Perceived Exertion 11-13    Perceived Dyspnea 0-4      Progression   Progression Continue progressive overload as per policy without signs/symptoms or physical distress.      Resistance Training   Training Prescription Yes    Weight blue bands    Reps 10-15             Perform Capillary Blood Glucose checks as needed.  Exercise Prescription Changes:   Exercise Prescription Changes     Row Name 03/18/23 1100             Response to Exercise   Blood Pressure (Admit) 128/66       Blood Pressure (Exercise) 126/64       Blood Pressure (Exit) 112/66       Heart Rate (Admit) 71 bpm       Heart Rate (Exercise) 88 bpm       Heart Rate (Exit) 74 bpm       Oxygen Saturation (Admit) 95 %       Oxygen Saturation (Exercise) 95 %       Oxygen Saturation (Exit) 95 %  Rating of Perceived Exertion (Exercise) 13       Perceived Dyspnea (Exercise) 3       Duration Continue with 30 min of aerobic exercise without signs/symptoms of physical distress.       Intensity THRR unchanged         Resistance Training   Training Prescription Yes       Weight blue bands       Reps  10-15       Time 10 Minutes         Treadmill   MPH 1.8       Grade 0       Minutes 15       METs 2.2         Recumbant Elliptical   Level 2       RPM 46       Minutes 15       METs 1.8                Exercise Comments:   Exercise Comments     Row Name 03/18/23 1156           Exercise Comments Pt completed his first day of group exercise. He exercised on the treadmill for 15 min, 1.8 mph, 0 incline, METs 2.2. He then exercised on the recumbent elliptical for 15 min, level 2, METs 1.8. Pt tolerated well although admits to fatigue. He performed warm up and cool down without limitations, including squats. Discussed METs with fair comprehension.                Exercise Goals and Review:   Exercise Goals     Row Name 03/14/23 1029             Exercise Goals   Increase Physical Activity Yes       Intervention Provide advice, education, support and counseling about physical activity/exercise needs.;Develop an individualized exercise prescription for aerobic and resistive training based on initial evaluation findings, risk stratification, comorbidities and participant's personal goals.       Expected Outcomes Short Term: Attend rehab on a regular basis to increase amount of physical activity.;Long Term: Add in home exercise to make exercise part of routine and to increase amount of physical activity.;Long Term: Exercising regularly at least 3-5 days a week.       Increase Strength and Stamina Yes       Intervention Provide advice, education, support and counseling about physical activity/exercise needs.;Develop an individualized exercise prescription for aerobic and resistive training based on initial evaluation findings, risk stratification, comorbidities and participant's personal goals.       Expected Outcomes Short Term: Increase workloads from initial exercise prescription for resistance, speed, and METs.;Short Term: Perform resistance training exercises routinely  during rehab and add in resistance training at home;Long Term: Improve cardiorespiratory fitness, muscular endurance and strength as measured by increased METs and functional capacity ( )       Able to understand and use rate of perceived exertion (RPE) scale Yes       Intervention Provide education and explanation on how to use RPE scale       Expected Outcomes Short Term: Able to use RPE daily in rehab to express subjective intensity level;Long Term:  Able to use RPE to guide intensity level when exercising independently       Able to understand and use Dyspnea scale Yes       Intervention Provide education and explanation on how to  use Dyspnea scale       Expected Outcomes Short Term: Able to use Dyspnea scale daily in rehab to express subjective sense of shortness of breath during exertion;Long Term: Able to use Dyspnea scale to guide intensity level when exercising independently       Knowledge and understanding of Target Heart Rate Range (THRR) Yes       Intervention Provide education and explanation of THRR including how the numbers were predicted and where they are located for reference       Expected Outcomes Short Term: Able to state/look up THRR;Long Term: Able to use THRR to govern intensity when exercising independently;Short Term: Able to use daily as guideline for intensity in rehab       Understanding of Exercise Prescription Yes       Intervention Provide education, explanation, and written materials on patient's individual exercise prescription       Expected Outcomes Short Term: Able to explain program exercise prescription;Long Term: Able to explain home exercise prescription to exercise independently                Exercise Goals Re-Evaluation :  Exercise Goals Re-Evaluation     Row Name 03/14/23 1113             Exercise Goal Re-Evaluation   Exercise Goals Review Increase Physical Activity;Able to understand and use Dyspnea scale;Understanding of Exercise  Prescription;Increase Strength and Stamina;Knowledge and understanding of Target Heart Rate Range (THRR);Able to understand and use rate of perceived exertion (RPE) scale       Comments Arthur Harper is scheduled to exercise 11/26. Will continue to monitor and progress as able.       Expected Outcomes Through exercise at rehab and home, the patient will decrease shortness of breath with daily activities and feel confident in carrying out an exercise regimen at home.                Discharge Exercise Prescription (Final Exercise Prescription Changes):  Exercise Prescription Changes - 03/18/23 1100       Response to Exercise   Blood Pressure (Admit) 128/66    Blood Pressure (Exercise) 126/64    Blood Pressure (Exit) 112/66    Heart Rate (Admit) 71 bpm    Heart Rate (Exercise) 88 bpm    Heart Rate (Exit) 74 bpm    Oxygen Saturation (Admit) 95 %    Oxygen Saturation (Exercise) 95 %    Oxygen Saturation (Exit) 95 %    Rating of Perceived Exertion (Exercise) 13    Perceived Dyspnea (Exercise) 3    Duration Continue with 30 min of aerobic exercise without signs/symptoms of physical distress.    Intensity THRR unchanged      Resistance Training   Training Prescription Yes    Weight blue bands    Reps 10-15    Time 10 Minutes      Treadmill   MPH 1.8    Grade 0    Minutes 15    METs 2.2      Recumbant Elliptical   Level 2    RPM 46    Minutes 15    METs 1.8             Nutrition:  Target Goals: Understanding of nutrition guidelines, daily intake of sodium 1500mg , cholesterol 200mg , calories 30% from fat and 7% or less from saturated fats, daily to have 5 or more servings of fruits and vegetables.  Biometrics:  Pre Biometrics - 03/14/23 1138  Pre Biometrics   Grip Strength 32 kg              Nutrition Therapy Plan and Nutrition Goals:   Nutrition Assessments:  MEDIFICTS Score Key: >=70 Need to make dietary changes  40-70 Heart Healthy Diet <= 40  Therapeutic Level Cholesterol Diet   Picture Your Plate Scores: <30 Unhealthy dietary pattern with much room for improvement. 41-50 Dietary pattern unlikely to meet recommendations for good health and room for improvement. 51-60 More healthful dietary pattern, with some room for improvement.  >60 Healthy dietary pattern, although there may be some specific behaviors that could be improved.    Nutrition Goals Re-Evaluation:   Nutrition Goals Discharge (Final Nutrition Goals Re-Evaluation):   Psychosocial: Target Goals: Acknowledge presence or absence of significant depression and/or stress, maximize coping skills, provide positive support system. Participant is able to verbalize types and ability to use techniques and skills needed for reducing stress and depression.  Initial Review & Psychosocial Screening:  Initial Psych Review & Screening - 03/14/23 1025       Initial Review   Current issues with Current Depression;Current Psychotropic Meds;Current Stress Concerns    Source of Stress Concerns Family    Comments Pt is dealing with grief from recently losing one of his daughters. He is also dealing with family stress that has to do with his other daughter.      Family Dynamics   Good Support System? Yes    Concerns Recent loss of child      Barriers   Psychosocial barriers to participate in program The patient should benefit from training in stress management and relaxation.;Psychosocial barriers identified (see note)      Screening Interventions   Interventions Encouraged to exercise;To provide support and resources with identified psychosocial needs    Expected Outcomes Short Term goal: Utilizing psychosocial counselor, staff and physician to assist with identification of specific Stressors or current issues interfering with healing process. Setting desired goal for each stressor or current issue identified.;Long Term Goal: Stressors or current issues are controlled or  eliminated.;Short Term goal: Identification and review with participant of any Quality of Life or Depression concerns found by scoring the questionnaire.;Long Term goal: The participant improves quality of Life and PHQ9 Scores as seen by post scores and/or verbalization of changes             Quality of Life Scores:  Scores of 19 and below usually indicate a poorer quality of life in these areas.  A difference of  2-3 points is a clinically meaningful difference.  A difference of 2-3 points in the total score of the Quality of Life Index has been associated with significant improvement in overall quality of life, self-image, physical symptoms, and general health in studies assessing change in quality of life.  PHQ-9: Review Flowsheet  More data exists      03/14/2023 12/10/2022 10/15/2022 07/15/2022 03/19/2022  Depression screen PHQ 2/9  Decreased Interest 0 0 0 0 0 0  Down, Depressed, Hopeless 1 0 0 0 0 0  PHQ - 2 Score 1 0 0 0 0 0  Altered sleeping 1 0 0 0 0 0  Tired, decreased energy 1 0 0 3 0 0  Change in appetite 1 0 0 1 0 0  Feeling bad or failure about yourself  0 0 0 0 0 0  Trouble concentrating 0 0 0 0 0 0  Moving slowly or fidgety/restless 0 0 0 0 0 0  Suicidal thoughts  0 0 0 0 0 0  PHQ-9 Score 4 0 0 4 0 0  Difficult doing work/chores Not difficult at all Not difficult at all Not difficult at all Not difficult at all - -    Details       Multiple values from one day are sorted in reverse-chronological order        Interpretation of Total Score  Total Score Depression Severity:  1-4 = Minimal depression, 5-9 = Mild depression, 10-14 = Moderate depression, 15-19 = Moderately severe depression, 20-27 = Severe depression   Psychosocial Evaluation and Intervention:  Psychosocial Evaluation - 03/14/23 1026       Psychosocial Evaluation & Interventions   Interventions Encouraged to exercise with the program and follow exercise prescription;Stress management education     Comments Pt is dealing with grief from recently losing one of his daughters. He is also dealing with family stress that has to do with his other daughter. He is currently on psychotropic meds and is compliant with taking them. He also see's a counseler for his PTSD.    Expected Outcomes For Arthur Harper's depression and stress to be decreased during the PR program    Continue Psychosocial Services  Follow up required by staff             Psychosocial Re-Evaluation:  Psychosocial Re-Evaluation     Row Name 03/17/23 [phone]             Psychosocial Re-Evaluation   Current issues with Current Depression;Current Psychotropic Meds;Current Stress Concerns       Comments Arthur Harper is scheduled to start PR on 03/18/23. No new psychosocial barriers or concerns since orientation on 03/14/23.       Expected Outcomes For Arthur Harper to participate in PR free of any psychosocial barriers or concerns       Interventions Encouraged to attend Pulmonary Rehabilitation for the exercise       Continue Psychosocial Services  Follow up required by staff                Psychosocial Discharge (Final Psychosocial Re-Evaluation):  Psychosocial Re-Evaluation - 03/17/23 0943       Psychosocial Re-Evaluation   Current issues with Current Depression;Current Psychotropic Meds;Current Stress Concerns    Comments Arthur Harper is scheduled to start PR on 03/18/23. No new psychosocial barriers or concerns since orientation on 03/14/23.    Expected Outcomes For Arthur Harper to participate in PR free of any psychosocial barriers or concerns    Interventions Encouraged to attend Pulmonary Rehabilitation for the exercise    Continue Psychosocial Services  Follow up required by staff             Education: Education Goals: Education classes will be provided on a weekly basis, covering required topics. Participant will state understanding/return demonstration of topics presented.  Learning Barriers/Preferences:   Learning Barriers/Preferences - 03/14/23 1026       Learning Barriers/Preferences   Learning Barriers Sight;Hearing   wears glasses and hearing aids   Learning Preferences Computer/Internet;Group Instruction;Individual Instruction;Skilled Demonstration;Written Material             Education Topics: Know Your Numbers Group instruction that is supported by a PowerPoint presentation. Instructor discusses importance of knowing and understanding resting, exercise, and post-exercise oxygen saturation, heart rate, and blood pressure. Oxygen saturation, heart rate, blood pressure, rating of perceived exertion, and dyspnea are reviewed along with a normal range for these values.    Exercise for the Pulmonary Patient Group instruction that is  supported by a PowerPoint presentation. Instructor discusses benefits of exercise, core components of exercise, frequency, duration, and intensity of an exercise routine, importance of utilizing pulse oximetry during exercise, safety while exercising, and options of places to exercise outside of rehab.  Flowsheet Row PULMONARY REHAB OTHER RESPIRATORY from 09/12/2016 in [facility]  Date 08/08/16  Educator EP  Instruction Review Code (Retired) R- Review/reinforce       MET Level  Group instruction provided by PowerPoint, verbal discussion, and written material to support subject matter. Instructor reviews what METs are and how to increase METs.    Pulmonary Medications Verbally interactive group education provided by instructor with focus on inhaled medications and proper administration. Flowsheet Row PULMONARY REHAB OTHER RESPIRATORY from 09/12/2016 in [facility] for Heart, Vascular, & Lung Health  Date 09/05/16  Educator Pharm D  Instruction Review Code (Retired) 2- meets Chief Operating Officer and Physiology of the Respiratory System Group instruction  provided by PowerPoint, verbal discussion, and written material to support subject matter. Instructor reviews respiratory cycle and anatomical components of the respiratory system and their functions. Instructor also reviews differences in obstructive and restrictive respiratory diseases with examples of each.  Flowsheet Row PULMONARY REHAB OTHER RESPIRATORY from 09/12/2016 in [facility] for Heart, Vascular, & Lung Health  Date 08/29/16  Educator RN  Instruction Review Code (Retired) 2- meets goals/outcomes       Oxygen Safety Group instruction provided by PowerPoint, verbal discussion, and written material to support subject matter. There is an overview of "What is Oxygen" and "Why do we need it".  Instructor also reviews how to create a safe environment for oxygen use, the importance of using oxygen as prescribed, and the risks of noncompliance. There is a brief discussion on traveling with oxygen and resources the patient may utilize. Flowsheet Row PULMONARY REHAB OTHER RESPIRATORY from 09/12/2016 in [facility] for Heart, Vascular, & Lung Health  Date 07/25/16  Educator Daniel Nones  Instruction Review Code (Retired) 2- meets goals/outcomes       Oxygen Use Group instruction provided by PowerPoint, verbal discussion, and written material to discuss how supplemental oxygen is prescribed and different types of oxygen supply systems. Resources for more information are provided.    Breathing Techniques Group instruction that is supported by demonstration and informational handouts. Instructor discusses the benefits of pursed lip and diaphragmatic breathing and detailed demonstration on how to perform both.     Risk Factor Reduction Group instruction that is supported by a PowerPoint presentation. Instructor discusses the definition of a risk factor, different risk factors for pulmonary disease, and how the heart and lungs work together. Flowsheet  Row PULMONARY REHAB OTHER RESPIRATORY from 09/12/2016 in [facility] for Heart, Vascular, & Lung Health  Date 07/11/16  Educator EP  Instruction Review Code (Retired) 2- meets goals/outcomes       Pulmonary Diseases Group instruction provided by PowerPoint, verbal discussion, and written material to support subject matter. Instructor gives an overview of the different type of pulmonary diseases. There is also a discussion on risk factors and symptoms as well as ways to manage the diseases.   Stress and Energy Conservation Group instruction provided by PowerPoint, verbal discussion, and written material to support subject matter. Instructor gives an overview of stress and the impact it can have on the body. Instructor also reviews ways to reduce  stress. There is also a discussion on energy conservation and ways to conserve energy throughout the day.   Warning Signs and Symptoms Group instruction provided by PowerPoint, verbal discussion, and written material to support subject matter. Instructor reviews warning signs and symptoms of stroke, heart attack, cold and flu. Instructor also reviews ways to prevent the spread of infection.   Other Education Group or individual verbal, written, or video instructions that support the educational goals of the pulmonary rehab program.    Knowledge Questionnaire Score:  Knowledge Questionnaire Score - 03/14/23 1051       Knowledge Questionnaire Score   Pre Score 16/18             Core Components/Risk Factors/Patient Goals at Admission:  Personal Goals and Risk Factors at Admission - 03/14/23 1027       Core Components/Risk Factors/Patient Goals on Admission   Improve shortness of breath with ADL's Yes    Intervention Provide education, individualized exercise plan and daily activity instruction to help decrease symptoms of SOB with activities of daily living.    Expected Outcomes Short Term: Improve  cardiorespiratory fitness to achieve a reduction of symptoms when performing ADLs;Long Term: Be able to perform more ADLs without symptoms or delay the onset of symptoms    Increase knowledge of respiratory medications and ability to use respiratory devices properly  Yes    Intervention Provide education and demonstration as needed of appropriate use of medications, inhalers, and oxygen therapy.    Expected Outcomes Short Term: Achieves understanding of medications use. Understands that oxygen is a medication prescribed by physician. Demonstrates appropriate use of inhaler and oxygen therapy.;Long Term: Maintain appropriate use of medications, inhalers, and oxygen therapy.             Core Components/Risk Factors/Patient Goals Review:   Goals and Risk Factor Review     Row Name 03/17/23 0947             Core Components/Risk Factors/Patient Goals Review   Personal Goals Review Improve shortness of breath with ADL's;Develop more efficient breathing techniques such as purse lipped breathing and diaphragmatic breathing and practicing self-pacing with activity.;Increase knowledge of respiratory medications and ability to use respiratory devices properly.       Review Arthur Harper is scheduled to start PR on 03/18/23. Goal progressing for improve shortness of breath with ADL's. Goal progressing for developing more efficient breathing techniques such as purse lipped breathing and diaphragmatic breathing; and practicing self-pacing with activity. Goal progressing for increasing knowledge of respiratory medications and ability to use respiratory devices properly.       Expected Outcomes For Arthur Harper to improve shortness of breath with ADL's, develop more efficient breathing techniques such as purse lipped breathing and diaphragmatic breathing; and practicing self-pacing with activity and increase knowledge of respiratory medications and ability to use respiratory devices properly.                 Core Components/Risk Factors/Patient Goals at Discharge (Final Review):   Goals and Risk Factor Review - 03/17/23 0947       Core Components/Risk Factors/Patient Goals Review   Personal Goals Review Improve shortness of breath with ADL's;Develop more efficient breathing techniques such as purse lipped breathing and diaphragmatic breathing and practicing self-pacing with activity.;Increase knowledge of respiratory medications and ability to use respiratory devices properly.    Review Arthur Harper is scheduled to start PR on 03/18/23. Goal progressing for improve shortness of breath with ADL's. Goal progressing for developing more efficient  breathing techniques such as purse lipped breathing and diaphragmatic breathing; and practicing self-pacing with activity. Goal progressing for increasing knowledge of respiratory medications and ability to use respiratory devices properly.    Expected Outcomes For Arthur Harper to improve shortness of breath with ADL's, develop more efficient breathing techniques such as purse lipped breathing and diaphragmatic breathing; and practicing self-pacing with activity and increase knowledge of respiratory medications and ability to use respiratory devices properly.             ITP Comments: Pt is making expected progress toward Pulmonary Rehab goals after completing 1 session(s). Recommend continued exercise, life style modification, education, and utilization of breathing techniques to increase stamina and strength, while also decreasing shortness of breath with exertion.  Dr. Mechele Collin is Medical Director for Pulmonary Rehab at [facility].

## 2023-03-25 ENCOUNTER — Encounter (HOSPITAL_COMMUNITY)
Admission: RE | Admit: 2023-03-25 | Discharge: 2023-03-25 | Disposition: A | Discharge status: Home / Self Care | Payer: No Typology Code available for payment source | Source: Ambulatory Visit | Attending: Pulmonary Disease | Admitting: Pulmonary Disease

## 2023-03-25 DIAGNOSIS — Z5189 Encounter for other specified aftercare: Secondary | ICD-10-CM | POA: Diagnosis present

## 2023-03-25 DIAGNOSIS — Z87891 Personal history of nicotine dependence: Secondary | ICD-10-CM | POA: Diagnosis not present

## 2023-03-25 DIAGNOSIS — J449 Chronic obstructive pulmonary disease, unspecified: Secondary | ICD-10-CM | POA: Insufficient documentation

## 2023-03-25 NOTE — Progress Notes (Signed)
Daily Session Note  Patient Details  Name: Arthur Harper MRN: [id number] Date of Birth: 05/08/1945 Referring Provider:   Doristine Devoid Pulmonary Rehab Walk Test from 03/14/2023 in [facility] for Heart, Vascular, & Lung Health  Referring Provider [name]  [[name]]       Encounter Date: 03/25/2023  Check In:  Session Check In - 03/25/23 1128       Check-In   Supervising physician immediately available to respond to emergencies [facility] MD immediately available    Physician(s) Eligha Bridegroom, NP    Location [facility]    Staff Present Raford Pitcher, MS, ACSM-CEP, Exercise Physiologist;Alin Hutchins Gerre Scull, RN, BSN;Randi Reeve BS, ACSM-CEP, Exercise Physiologist;David Silverton, MS, ACSM-CEP, CCRP, Exercise Physiologist    Virtual Visit No    Medication changes reported     No    Fall or balance concerns reported    No    Tobacco Cessation No Change    Warm-up and Cool-down Performed as group-led instruction    Resistance Training Performed Yes    VAD Patient? No    PAD/SET Patient? No      Pain Assessment   Currently in Pain? No/denies             Capillary Blood Glucose: No results found for this or any previous visit (from the past 24 hour(s)).    Social History   Tobacco Use  Smoking Status Former   Current packs/day: 0.00   Average packs/day: 1 pack/day for 40.0 years (40.0 ttl pk-yrs)   Types: Cigarettes   Start date: 12/31/1968   Quit date: 12/31/2008   Years since quitting: 14.2  Smokeless Tobacco Never  Tobacco Comments   10 cigs per day    Goals Met:  Exercise tolerated well No report of concerns or symptoms today Strength training completed today  Goals Unmet:  Pt's HR in the 50s today, usually runs in the 70s. Placed on EKG, pt in SB at rest, NSR with exercise. No s/s of bradycardia.   Comments: Dr. Mechele Collin is Medical Director for Pulmonary Rehab at [facility].

## 2023-03-27 ENCOUNTER — Encounter (HOSPITAL_COMMUNITY)
Admission: RE | Admit: 2023-03-27 | Discharge: 2023-03-27 | Disposition: A | Discharge status: Home / Self Care | Payer: No Typology Code available for payment source | Source: Ambulatory Visit | Attending: Pulmonary Disease | Admitting: Pulmonary Disease

## 2023-03-27 DIAGNOSIS — Z5189 Encounter for other specified aftercare: Secondary | ICD-10-CM | POA: Diagnosis not present

## 2023-03-27 DIAGNOSIS — J449 Chronic obstructive pulmonary disease, unspecified: Secondary | ICD-10-CM

## 2023-03-27 NOTE — Progress Notes (Signed)
Daily Session Note  Patient Details  Name: Arthur Harper MRN: [id number] Date of Birth: February 05, 1946 Referring Provider:   Doristine Devoid Pulmonary Rehab Walk Test from 03/14/2023 in [facility] for Heart, Vascular, & Lung Health  Referring Provider [name]  [[name]]       Encounter Date: 03/27/2023  Check In:  Session Check In - 03/27/23 1057       Check-In   Supervising physician immediately available to respond to emergencies [facility] MD immediately available    Physician(s) Edd Fabian, NP    [facility]    Staff Present Raford Pitcher, MS, ACSM-CEP, Exercise Physiologist;Mary Gerre Scull, RN, BSN;Randi Idelle Crouch BS, ACSM-CEP, Exercise Physiologist;Amsi Grimley Katrinka Blazing, RT    Virtual Visit No    Medication changes reported     No    Fall or balance concerns reported    No    Tobacco Cessation No Change    Warm-up and Cool-down Performed as group-led instruction    Resistance Training Performed Yes    VAD Patient? No    PAD/SET Patient? No      Pain Assessment   Currently in Pain? No/denies    Multiple Pain Sites No             Capillary Blood Glucose: No results found for this or any previous visit (from the past 24 hour(s)).    Social History   Tobacco Use  Smoking Status Former   Current packs/day: 0.00   Average packs/day: 1 pack/day for 40.0 years (40.0 ttl pk-yrs)   Types: Cigarettes   Start date: 12/31/1968   Quit date: 12/31/2008   Years since quitting: 14.2  Smokeless Tobacco Never  Tobacco Comments   10 cigs per day    Goals Met:  Proper associated with RPD/PD & O2 Sat Independence with exercise equipment Exercise tolerated well No report of concerns or symptoms today Strength training completed today  Goals Unmet:  Not Applicable  Comments: Service time is from 1014 to 1149.    Dr. Mechele Collin is Medical Director for Pulmonary Rehab at [facility].

## 2023-03-31 ENCOUNTER — Telehealth (HOSPITAL_COMMUNITY): Payer: Self-pay

## 2023-03-31 NOTE — Telephone Encounter (Signed)
Arthur Harper called has an appointment at [facility]. He wanted to know if it was okay to come to PR at 10:30. After speaking with Corrie Dandy I informed him it was okay as long as he was here by 1030. He feels better to just wait and come to the 115 class!

## 2023-04-01 ENCOUNTER — Encounter (HOSPITAL_COMMUNITY)
Admission: RE | Admit: 2023-04-01 | Discharge: 2023-04-01 | Disposition: A | Discharge status: Home / Self Care | Payer: No Typology Code available for payment source | Source: Ambulatory Visit | Attending: Pulmonary Disease | Admitting: Pulmonary Disease

## 2023-04-01 ENCOUNTER — Encounter (HOSPITAL_COMMUNITY): Payer: No Typology Code available for payment source

## 2023-04-01 VITALS — Wt 239.2 lb

## 2023-04-01 DIAGNOSIS — Z5189 Encounter for other specified aftercare: Secondary | ICD-10-CM | POA: Diagnosis not present

## 2023-04-01 DIAGNOSIS — J449 Chronic obstructive pulmonary disease, unspecified: Secondary | ICD-10-CM

## 2023-04-01 NOTE — Progress Notes (Signed)
Daily Session Note  Patient Details  Name: Arthur Harper MRN: [id number] Date of Birth: 12-24-1945 Referring Provider:   Doristine Devoid Pulmonary Rehab Walk Test from 03/14/2023 in [facility] for Heart, Vascular, & Lung Health  Referring Provider [name]  [[name]]       Encounter Date: 04/01/2023  Check In:  Session Check In - 04/01/23 1159       Check-In   Supervising physician immediately available to respond to emergencies [facility] MD immediately available    Physician(s) Carlyon Shadow, NP    [facility]    Staff Present Raford Pitcher, MS, ACSM-CEP, Exercise Physiologist;Mary Gerre Scull, RN, BSN;Chakira Jachim Idelle Crouch BS, ACSM-CEP, Exercise Physiologist;Casey Katrinka Blazing, RT    Virtual Visit No    Medication changes reported     No    Fall or balance concerns reported    No    Tobacco Cessation No Change    Warm-up and Cool-down Performed as group-led instruction    Resistance Training Performed Yes    VAD Patient? No    PAD/SET Patient? No      Pain Assessment   Currently in Pain? No/denies             Capillary Blood Glucose: No results found for this or any previous visit (from the past 24 hour(s)).   Exercise Prescription Changes - 04/01/23 1100       Response to Exercise   Blood Pressure (Admit) 122/68    Blood Pressure (Exercise) 140/70    Blood Pressure (Exit) 102/60    Heart Rate (Admit) 60 bpm    Heart Rate (Exercise) 78 bpm    Heart Rate (Exit) 72 bpm    Oxygen Saturation (Admit) 97 %    Oxygen Saturation (Exercise) 94 %    Oxygen Saturation (Exit) 93 %    Rating of Perceived Exertion (Exercise) 13    Perceived Dyspnea (Exercise) 2    Duration Continue with 30 min of aerobic exercise without signs/symptoms of physical distress.    Intensity THRR unchanged      Progression   Progression Continue to progress workloads to maintain intensity without signs/symptoms of physical distress.    Average METs 2.99       Resistance Training   Training Prescription Yes    Weight blue bands    Reps 10-15    Time 10 Minutes      Treadmill   MPH 2.2    Grade 1    Minutes 15    METs 2.99      Recumbant Elliptical   Level 3    Minutes 15    METs 2.2             Social History   Tobacco Use  Smoking Status Former   Current packs/day: 0.00   Average packs/day: 1 pack/day for 40.0 years (40.0 ttl pk-yrs)   Types: Cigarettes   Start date: 12/31/1968   Quit date: 12/31/2008   Years since quitting: 14.2  Smokeless Tobacco Never  Tobacco Comments   10 cigs per day    Goals Met:  Exercise tolerated well No report of concerns or symptoms today Strength training completed today  Goals Unmet:  Not Applicable  Comments: Service time is from 1016 to 1150.    Dr. Mechele Collin is Medical Director for Pulmonary Rehab at [facility].

## 2023-04-03 ENCOUNTER — Encounter (HOSPITAL_COMMUNITY)
Admission: RE | Admit: 2023-04-03 | Discharge: 2023-04-03 | Disposition: A | Discharge status: Home / Self Care | Payer: No Typology Code available for payment source | Source: Ambulatory Visit | Attending: Pulmonary Disease | Admitting: Pulmonary Disease

## 2023-04-03 DIAGNOSIS — Z5189 Encounter for other specified aftercare: Secondary | ICD-10-CM | POA: Diagnosis not present

## 2023-04-03 DIAGNOSIS — J449 Chronic obstructive pulmonary disease, unspecified: Secondary | ICD-10-CM

## 2023-04-03 NOTE — Progress Notes (Signed)
Daily Session Note  Patient Details  Name: Arthur Harper MRN: [id number] Date of Birth: [birth year] Referring Provider:   Doristine Devoid Pulmonary Rehab Walk Test from 03/14/2023 in [facility] for Heart, Vascular, & Lung Health  Referring Provider [name]  [[name]]       Encounter Date: 04/03/2023  Check In:  Session Check In - 04/03/23 1042       Check-In   Supervising physician immediately available to respond to emergencies [facility] MD immediately available    Physician(s) Joni Reining, NP    [facility]    Staff Present Raford Pitcher, MS, ACSM-CEP, Exercise Physiologist;Mary Gerre Scull, RN, BSN;Randi Idelle Crouch BS, ACSM-CEP, Exercise Physiologist;Sena Hoopingarner Katrinka Blazing, RT    Virtual Visit No    Medication changes reported     No    Fall or balance concerns reported    No    Tobacco Cessation No Change    Warm-up and Cool-down Performed as group-led instruction    Resistance Training Performed Yes    VAD Patient? No    PAD/SET Patient? No      Pain Assessment   Currently in Pain? No/denies    Multiple Pain Sites No             Capillary Blood Glucose: No results found for this or any previous visit (from the past 24 hours).    Social History   Tobacco Use  Smoking Status Former   Current packs/day: 0.00   Average packs/day: 1 pack/day for 40.0 years (40.0 ttl pk-yrs)   Types: Cigarettes   Start date: 12/31/1968   Quit date: 12/31/2008   Years since quitting: 14.2  Smokeless Tobacco Never  Tobacco Comments   10 cigs per day    Goals Met:  Proper associated with RPD/PD & O2 Sat Independence with exercise equipment Exercise tolerated well No report of concerns or symptoms today Strength training completed today  Goals Unmet:  Not Applicable  Comments: Service time is from 1014 to 1137.      Dr. Mechele Collin is Medical Director for Pulmonary Rehab at [facility].

## 2023-04-08 ENCOUNTER — Encounter (HOSPITAL_COMMUNITY)
Admission: RE | Admit: 2023-04-08 | Discharge: 2023-04-08 | Disposition: A | Discharge status: Home / Self Care | Payer: No Typology Code available for payment source | Source: Ambulatory Visit | Attending: Pulmonary Disease | Admitting: Pulmonary Disease

## 2023-04-08 DIAGNOSIS — J449 Chronic obstructive pulmonary disease, unspecified: Secondary | ICD-10-CM

## 2023-04-08 DIAGNOSIS — Z5189 Encounter for other specified aftercare: Secondary | ICD-10-CM | POA: Diagnosis not present

## 2023-04-08 NOTE — Progress Notes (Signed)
Daily Session Note  Patient Details  Name: Arthur Harper MRN: [id number] Date of Birth: [birth year] Referring Provider:   Doristine Devoid Pulmonary Rehab Walk Test from 03/14/2023 in [facility] for Heart, Vascular, & Lung Health  Referring Provider [name]  [[name]]       Encounter Date: 04/08/2023  Check In:  Session Check In - 04/08/23 1116       Check-In   Supervising physician immediately available to respond to emergencies [facility] MD immediately available    Physician(s) Bernadene Person, NP    [facility]    Staff Present Raford Pitcher, MS, ACSM-CEP, Exercise Physiologist;Koleton Duchemin Gerre Scull, RN, BSN;Randi Idelle Crouch BS, ACSM-CEP, Exercise Physiologist;Casey Katrinka Blazing, RT    Virtual Visit No    Medication changes reported     No    Fall or balance concerns reported    No    Tobacco Cessation No Change    Warm-up and Cool-down Performed as group-led instruction    Resistance Training Performed Yes    VAD Patient? No    PAD/SET Patient? No      Pain Assessment   Currently in Pain? No/denies    Multiple Pain Sites No             Capillary Blood Glucose: No results found for this or any previous visit (from the past 24 hours).    Social History   Tobacco Use  Smoking Status Former   Current packs/day: 0.00   Average packs/day: 1 pack/day for 40.0 years (40.0 ttl pk-yrs)   Types: Cigarettes   Start date: 12/31/1968   Quit date: 12/31/2008   Years since quitting: 14.2  Smokeless Tobacco Never  Tobacco Comments   10 cigs per day    Goals Met:  Independence with exercise equipment Exercise tolerated well No report of concerns or symptoms today Strength training completed today  Goals Unmet:  Not Applicable  Comments: Service time is from 1018 to 1144    Dr. Mechele Collin is Medical Director for Pulmonary Rehab at [facility].

## 2023-04-10 ENCOUNTER — Encounter (HOSPITAL_COMMUNITY)
Admission: RE | Admit: 2023-04-10 | Discharge: 2023-04-10 | Disposition: A | Discharge status: Home / Self Care | Payer: No Typology Code available for payment source | Source: Ambulatory Visit | Attending: Pulmonary Disease | Admitting: Pulmonary Disease

## 2023-04-10 DIAGNOSIS — Z5189 Encounter for other specified aftercare: Secondary | ICD-10-CM | POA: Diagnosis not present

## 2023-04-10 DIAGNOSIS — J449 Chronic obstructive pulmonary disease, unspecified: Secondary | ICD-10-CM

## 2023-04-10 NOTE — Progress Notes (Signed)
Daily Session Note  Patient Details  Name: Arthur Harper MRN: [id number] Date of Birth: January 09, 1946 Referring Provider:   Doristine Devoid Pulmonary Rehab Walk Test from 03/14/2023 in [facility] for Heart, Vascular, & Lung Health  Referring Provider [name]  [[name]]       Encounter Date: 04/10/2023  Check In:  Session Check In - 04/10/23 1108       Check-In   Supervising physician immediately available to respond to emergencies [facility] MD immediately available    Physician(s) Robin Searing, NP    [facility]    Staff Present Raford Pitcher, MS, ACSM-CEP, Exercise Physiologist;Mary Gerre Scull, RN, BSN;Randi Idelle Crouch BS, ACSM-CEP, Exercise Physiologist;Callen Zuba Katrinka Blazing, RT    Virtual Visit No    Medication changes reported     No    Fall or balance concerns reported    No    Tobacco Cessation No Change    Warm-up and Cool-down Performed as group-led instruction    Resistance Training Performed Yes    VAD Patient? No    PAD/SET Patient? No      Pain Assessment   Currently in Pain? No/denies    Multiple Pain Sites No             Capillary Blood Glucose: No results found for this or any previous visit (from the past 24 hours).    Social History   Tobacco Use  Smoking Status Former   Current packs/day: 0.00   Average packs/day: 1 pack/day for 40.0 years (40.0 ttl pk-yrs)   Types: Cigarettes   Start date: 12/31/1968   Quit date: 12/31/2008   Years since quitting: 14.2  Smokeless Tobacco Never  Tobacco Comments   10 cigs per day    Goals Met:  Proper associated with RPD/PD & O2 Sat Independence with exercise equipment Exercise tolerated well No report of concerns or symptoms today Strength training completed today  Goals Unmet:  Not Applicable  Comments: Service time is from 1019 to 1149.    Dr. Mechele Collin is Medical Director for Pulmonary Rehab at [facility].

## 2023-04-12 ENCOUNTER — Other Ambulatory Visit: Payer: Self-pay | Admitting: Nurse Practitioner

## 2023-04-12 DIAGNOSIS — F409 Phobic anxiety disorder, unspecified: Secondary | ICD-10-CM

## 2023-04-12 DIAGNOSIS — F411 Generalized anxiety disorder: Secondary | ICD-10-CM

## 2023-04-14 MED ORDER — TRAZODONE HCL 50 MG PO TABS: 50.0000 mg | ORAL_TABLET | Freq: Every evening | ORAL | 1 refills | Status: DC | PRN

## 2023-04-14 MED ORDER — CITALOPRAM HYDROBROMIDE 10 MG PO TABS: 10.0000 mg | ORAL_TABLET | Freq: Every day | ORAL | 1 refills | Status: DC

## 2023-04-15 ENCOUNTER — Encounter (HOSPITAL_COMMUNITY)
Admission: RE | Admit: 2023-04-15 | Discharge: 2023-04-15 | Disposition: A | Discharge status: Home / Self Care | Payer: No Typology Code available for payment source | Source: Ambulatory Visit | Attending: Pulmonary Disease | Admitting: Pulmonary Disease

## 2023-04-15 VITALS — Wt 238.5 lb

## 2023-04-15 DIAGNOSIS — Z5189 Encounter for other specified aftercare: Secondary | ICD-10-CM | POA: Diagnosis not present

## 2023-04-15 DIAGNOSIS — J449 Chronic obstructive pulmonary disease, unspecified: Secondary | ICD-10-CM

## 2023-04-15 NOTE — Progress Notes (Signed)
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)  Patient's Home Medications on Admission:   Current Outpatient Medications:    albuterol (VENTOLIN HFA) 108 (90 Base) MCG/ACT inhaler, Inhale 2 puffs into the lungs every 6 (six) hours as needed., Disp: 18 g, Rfl: 2   ammonium lactate (LAC-HYDRIN) 12 % lotion, Apply 1 Application topically as needed for dry skin., Disp: , Rfl:    aspirin 81 MG chewable tablet, Chew 81 mg by mouth daily., Disp: , Rfl:    atorvastatin (LIPITOR) 20 MG tablet, Take 20 mg by mouth daily., Disp: , Rfl:    carboxymethylcellulose (REFRESH PLUS) 0.5 % SOLN, INSTILL 1 DROP IN BOTH EYES FOUR TIMES A DAY AS NEEDED, Disp: , Rfl:    cetirizine (ZYRTEC) 10 MG tablet, Take by mouth. (Patient not taking: Reported on 03/14/2023), Disp: , Rfl:    citalopram (CELEXA) 10 MG tablet, Take 1 tablet (10 mg total) by mouth daily., Disp: 90 tablet, Rfl: 1   cyclobenzaprine (FLEXERIL) 10 MG tablet, Take 1 tablet (10 mg total) by mouth 2 (two) times daily as needed for up to 20 doses for muscle spasms., Disp: 20 tablet, Rfl: 0   diclofenac Sodium (VOLTAREN) 1 % GEL, Apply 2 g topically 4 (four) times daily., Disp: , Rfl:    famotidine (PEPCID) 20 MG tablet, One after supper, Disp: 30 tablet, Rfl: 11   fluticasone (FLONASE) 50 MCG/ACT nasal spray, Place 1-2 sprays into both nostrils daily., Disp: 16 g, Rfl: 2   hydrocortisone 2.5 % cream, APPLY SMALL AMOUNT TO AFFECTED AREA  TWICE A DAY AS NEEDED -MIX WITH KETOCONAZOLE CREAM AND APPLY TO THE RED AREAS UNDER ARMPITS AND  IN THE GROIN TWICE DAILY AS NEEDED -MIX WITH KETOCONAZOLE CREAM AND APPLY TO THE RED AREAS UNDER ARMPITS AND   IN THE GROIN TWICE DAILY AS NEEDED, Disp: , Rfl:    ibuprofen (ADVIL) 600 MG tablet, Take 600 mg by mouth every 6 (six) hours as needed., Disp: , Rfl:    ketoconazole (NIZORAL) 2 % cream, APPLY SMALL AMOUNT TO AFFECTED AREA TWICE A DAY -MIX WITH HYDROCORTISONE CREAM AND APPLY TO THE RED AREAS IN THE GROIN AND  ARMPIT TWICE DAILY AS NEEDED -MIX WITH HYDROCORTISONE CREAM AND APPLY TO THE RED AREAS IN THE GROIN AND  ARMPIT TWICE DAILY AS NEEDED, Disp: , Rfl:    latanoprost (XALATAN) 0.005 % ophthalmic solution, 1 drop at bedtime., Disp: , Rfl:    montelukast (SINGULAIR) 10 MG tablet, Take 1 tablet (10 mg total) by mouth at bedtime. (Patient not taking: Reported on 03/14/2023), Disp: 30 tablet, Rfl: 5   Multiple Vitamin (MULTIVITAMIN) capsule, Take 1 capsule by mouth daily., Disp: , Rfl:    omeprazole (PRILOSEC) 40 MG capsule, Take 30-60 min before first meal of the day, Disp: 30 capsule, Rfl: 11   terazosin (HYTRIN) 5 MG capsule, Take 5 mg by mouth at bedtime., Disp: , Rfl:    Tiotropium Bromide-Olodaterol (STIOLTO RESPIMAT) 2.5-2.5 MCG/ACT AERS,  Inhale 2 puffs into the lungs daily., Disp: , Rfl:    traZODone (DESYREL) 50 MG tablet, Take 1 tablet (50 mg total) by mouth at bedtime as needed for sleep., Disp: 90 tablet, Rfl: 1   triamcinolone cream (KENALOG) 0.5 %, Apply 1 Application topically 3 (three) times daily., Disp: 30 g, Rfl: 2  Past Medical History: Past Medical History:  Diagnosis Date   Agent orange exposure    COPD (chronic obstructive pulmonary disease) (HCC)    Coronary artery disease    Hyperlipidemia    PTSD (post-traumatic stress disorder)    Sleep apnea     Tobacco Use: Social History   Tobacco Use  Smoking Status Former   Current packs/day: 0.00   Average packs/day: 1  pack/day for 40.0 years (40.0 ttl pk-yrs)   Types: Cigarettes   Start date: 12/31/1968   Quit date: 12/31/2008   Years since quitting: 14.2  Smokeless Tobacco Never  Tobacco Comments   10 cigs per day    Labs: Review Flowsheet  More data may exist      Latest Ref Rng & Units 12/25/2007 08/14/2012 09/07/2021 10/16/2022 02/21/2023  Labs for ITP Cardiac and Pulmonary Rehab  Cholestrol 100 - 199 mg/dL 962  952  841  324  401   LDL (calc) 0 - 99 mg/dL 027  98  80  81  58   HDL-C >39 mg/dL 25.3  66.44  67  70  74   Trlycerides 0 - 149 mg/dL 52  03.4  78  63  41   Hemoglobin A1c 4.8 - 5.6 % - - 5.4  5.5  -    Capillary Blood Glucose: No results found for: "GLUCAP"   Pulmonary Assessment Scores:  Pulmonary Assessment Scores     Row Name 03/14/23 1050         ADL UCSD   ADL Phase Entry     SOB Score total 64       CAT Score   CAT Score 26       mMRC Score   mMRC Score 2             UCSD: Self-administered rating of dyspnea associated with activities of daily living (ADLs) 6-point scale (0 = "not at all" to 5 = "maximal or unable to do because of breathlessness")  Scoring Scores range from 0 to 120.  Minimally important difference is 5 units  CAT: CAT can identify the health impairment of COPD patients and is better correlated with disease progression.  CAT has a scoring range of zero to 40. The CAT score is classified into four groups of low (less than 10), medium (10 - 20), high (21-30) and very high (31-40) based on the impact level of disease on health status. A CAT score over 10 suggests significant symptoms.  A worsening CAT score could be explained by an exacerbation, poor medication adherence, poor inhaler technique, or progression of COPD or comorbid conditions.  CAT MCID is 2 points  mMRC: mMRC (Modified Medical Research Council) Dyspnea Scale is used to assess the degree of baseline functional disability in patients of respiratory disease due to dyspnea. No  minimal important difference is established. A decrease in score of 1 point or greater is considered a positive change.   Pulmonary Function Assessment:  Pulmonary Function Assessment - 03/14/23 1030       Breath   Bilateral Breath Sounds Clear;Decreased    Shortness of Breath Yes;Limiting activity  Exercise Target Goals: Exercise Program Goal: Individual exercise prescription set using results from initial 6 min walk test and THRR while considering  patient's activity barriers and safety.   Exercise Prescription Goal: Initial exercise prescription builds to 30-45 minutes a day of aerobic activity, 2-3 days per week.  Home exercise guidelines will be given to patient during program as part of exercise prescription that the participant will acknowledge.  Activity Barriers & Risk Stratification:  Activity Barriers & Cardiac Risk Stratification - 03/14/23 1029       Activity Barriers & Cardiac Risk Stratification   Activity Barriers Deconditioning;Muscular Weakness;Shortness of Breath;Arthritis    Cardiac Risk Stratification Moderate             6 Minute Walk:  6 Minute Walk     Row Name 03/14/23 1111         6 Minute Walk   Phase Initial     Distance 1280 feet     Walk Time 6 minutes     # of Rest Breaks 0     MPH 2.42     METS 10.95     RPE 12     Perceived Dyspnea  3     VO2 Peak 6.84     Symptoms No     Resting HR 61 bpm     Resting BP 142/70     Resting Oxygen Saturation  95 %     Exercise Oxygen Saturation  during 6 min walk 89 %     Max Ex. HR 79 bpm     Max Ex. BP 152/68     2 Minute Post BP 128/70       Interval HR   1 Minute HR 63     2 Minute HR 63     3 Minute HR 65     4 Minute HR 63     5 Minute HR 63     6 Minute HR 79     2 Minute Post HR 64     Interval Heart Rate? Yes       Interval Oxygen   Interval Oxygen? Yes     Baseline Oxygen Saturation % 95 %     1 Minute Oxygen Saturation % 93 %     1 Minute Liters of  Oxygen 0 L     2 Minute Oxygen Saturation % 89 %     2 Minute Liters of Oxygen 0 L     3 Minute Oxygen Saturation % 90 %     3 Minute Liters of Oxygen 0 L     4 Minute Oxygen Saturation % 90 %     4 Minute Liters of Oxygen 0 L     5 Minute Oxygen Saturation % 91 %     5 Minute Liters of Oxygen 0 L     6 Minute Oxygen Saturation % 90 %     6 Minute Liters of Oxygen 0 L     2 Minute Post Oxygen Saturation % 95 %     2 Minute Post Liters of Oxygen 0 L              Oxygen Initial Assessment:  Oxygen Initial Assessment - 03/14/23 1030       Home Oxygen   Home Oxygen Device None    Sleep Oxygen Prescription CPAP    Home Exercise Oxygen Prescription None    Home Resting Oxygen Prescription None      Initial  6 min Walk   Oxygen Used None      Program Oxygen Prescription   Program Oxygen Prescription None      Intervention   Short Term Goals To learn and understand importance of monitoring SPO2 with pulse oximeter and demonstrate accurate use of the pulse oximeter.;To learn and understand importance of maintaining oxygen saturations>88%;To learn and demonstrate proper use of respiratory medications;To learn and demonstrate proper pursed lip breathing techniques or other breathing techniques.     Long  Term Goals Maintenance of O2 saturations>88%;Compliance with respiratory medication;Verbalizes importance of monitoring SPO2 with pulse oximeter and return demonstration;Exhibits proper breathing techniques, such as pursed lip breathing or other method taught during program session;Demonstrates proper use of MDI's             Oxygen Re-Evaluation:  Oxygen Re-Evaluation     Row Name 03/14/23 1030 04/07/23 0730           Program Oxygen Prescription   Program Oxygen Prescription -- None        Home Oxygen   Home Oxygen Device -- None      Sleep Oxygen Prescription -- CPAP      Home Exercise Oxygen Prescription -- None      Home Resting Oxygen Prescription -- None         Goals/Expected Outcomes   Short Term Goals -- To learn and understand importance of monitoring SPO2 with pulse oximeter and demonstrate accurate use of the pulse oximeter.;To learn and understand importance of maintaining oxygen saturations>88%;To learn and demonstrate proper use of respiratory medications;To learn and demonstrate proper pursed lip breathing techniques or other breathing techniques.       Long  Term Goals -- Maintenance of O2 saturations>88%;Compliance with respiratory medication;Verbalizes importance of monitoring SPO2 with pulse oximeter and return demonstration;Exhibits proper breathing techniques, such as pursed lip breathing or other method taught during program session;Demonstrates proper use of MDI's      Goals/Expected Outcomes Compliance and understanding of oxygen saturation monitoring and breathing techniques to decrease shortness of breath. Compliance and understanding of oxygen saturation monitoring and breathing techniques to decrease shortness of breath.               Oxygen Discharge (Final Oxygen Re-Evaluation):  Oxygen Re-Evaluation - 04/07/23 0730       Program Oxygen Prescription   Program Oxygen Prescription None      Home Oxygen   Home Oxygen Device None    Sleep Oxygen Prescription CPAP    Home Exercise Oxygen Prescription None    Home Resting Oxygen Prescription None      Goals/Expected Outcomes   Short Term Goals To learn and understand importance of monitoring SPO2 with pulse oximeter and demonstrate accurate use of the pulse oximeter.;To learn and understand importance of maintaining oxygen saturations>88%;To learn and demonstrate proper use of respiratory medications;To learn and demonstrate proper pursed lip breathing techniques or other breathing techniques.     Long  Term Goals Maintenance of O2 saturations>88%;Compliance with respiratory medication;Verbalizes importance of monitoring SPO2 with pulse oximeter and return  demonstration;Exhibits proper breathing techniques, such as pursed lip breathing or other method taught during program session;Demonstrates proper use of MDI's    Goals/Expected Outcomes Compliance and understanding of oxygen saturation monitoring and breathing techniques to decrease shortness of breath.             Initial Exercise Prescription:  Initial Exercise Prescription - 03/14/23 1100       Date of Initial Exercise RX and  Referring Provider   Date 03/14/23    Referring Provider Vivia Budge   Expected Discharge Date 06/04/22      Treadmill   MPH 1.8    Grade 0    Minutes 15      Recumbant Elliptical   Level 2    RPM 60    Watts 40    Minutes 15      Prescription Details   Frequency (times per week) 2    Duration Progress to 30 minutes of continuous aerobic without signs/symptoms of physical distress      Intensity   THRR 40-80% of Max Heartrate 57-114    Ratings of Perceived Exertion 11-13    Perceived Dyspnea 0-4      Progression   Progression Continue progressive overload as per policy without signs/symptoms or physical distress.      Resistance Training   Training Prescription Yes    Weight blue bands    Reps 10-15             Perform Capillary Blood Glucose checks as needed.  Exercise Prescription Changes:   Exercise Prescription Changes     Row Name 03/18/23 1100 04/01/23 1100           Response to Exercise   Blood Pressure (Admit) 128/66 122/68      Blood Pressure (Exercise) 126/64 140/70      Blood Pressure (Exit) 112/66 102/60      Heart Rate (Admit) 71 bpm 60 bpm      Heart Rate (Exercise) 88 bpm 78 bpm      Heart Rate (Exit) 74 bpm 72 bpm      Oxygen Saturation (Admit) 95 % 97 %      Oxygen Saturation (Exercise) 95 % 94 %      Oxygen Saturation (Exit) 95 % 93 %      Rating of Perceived Exertion (Exercise) 13 13      Perceived Dyspnea (Exercise) 3 2      Duration Continue with 30 min of aerobic exercise without  signs/symptoms of physical distress. Continue with 30 min of aerobic exercise without signs/symptoms of physical distress.      Intensity THRR unchanged THRR unchanged        Progression   Progression -- Continue to progress workloads to maintain intensity without signs/symptoms of physical distress.      Average METs -- 2.99        Resistance Training   Training Prescription Yes Yes      Weight blue bands blue bands      Reps 10-15 10-15      Time 10 Minutes 10 Minutes        Treadmill   MPH 1.8 2.2      Grade 0 1      Minutes 15 15      METs 2.2 2.99        Recumbant Elliptical   Level 2 3      RPM 46 --      Minutes 15 15      METs 1.8 2.2               Exercise Comments:   Exercise Comments     Row Name 03/18/23 1156           Exercise Comments Pt completed his first day of group exercise. He exercised on the treadmill for 15 min, 1.8 mph, 0 incline, METs 2.2. He then exercised on the recumbent  elliptical for 15 min, level 2, METs 1.8. Pt tolerated well although admits to fatigue. He performed warm up and cool down without limitations, including squats. Discussed METs with fair comprehension.                Exercise Goals and Review:   Exercise Goals     Row Name 03/14/23 1029             Exercise Goals   Increase Physical Activity Yes       Intervention Provide advice, education, support and counseling about physical activity/exercise needs.;Develop an individualized exercise prescription for aerobic and resistive training based on initial evaluation findings, risk stratification, comorbidities and participant's personal goals.       Expected Outcomes Short Term: Attend rehab on a regular basis to increase amount of physical activity.;Long Term: Add in home exercise to make exercise part of routine and to increase amount of physical activity.;Long Term: Exercising regularly at least 3-5 days a week.       Increase Strength and Stamina Yes        Intervention Provide advice, education, support and counseling about physical activity/exercise needs.;Develop an individualized exercise prescription for aerobic and resistive training based on initial evaluation findings, risk stratification, comorbidities and participant's personal goals.       Expected Outcomes Short Term: Increase workloads from initial exercise prescription for resistance, speed, and METs.;Short Term: Perform resistance training exercises routinely during rehab and add in resistance training at home;Long Term: Improve cardiorespiratory fitness, muscular endurance and strength as measured by increased METs and functional capacity ( )       Able to understand and use rate of perceived exertion (RPE) scale Yes       Intervention Provide education and explanation on how to use RPE scale       Expected Outcomes Short Term: Able to use RPE daily in rehab to express subjective intensity level;Long Term:  Able to use RPE to guide intensity level when exercising independently       Able to understand and use Dyspnea scale Yes       Intervention Provide education and explanation on how to use Dyspnea scale       Expected Outcomes Short Term: Able to use Dyspnea scale daily in rehab to express subjective sense of shortness of breath during exertion;Long Term: Able to use Dyspnea scale to guide intensity level when exercising independently       Knowledge and understanding of Target Heart Rate Range (THRR) Yes       Intervention Provide education and explanation of THRR including how the numbers were predicted and where they are located for reference       Expected Outcomes Short Term: Able to state/look up THRR;Long Term: Able to use THRR to govern intensity when exercising independently;Short Term: Able to use daily as guideline for intensity in rehab       Understanding of Exercise Prescription Yes       Intervention Provide education, explanation, and written materials on patient's  individual exercise prescription       Expected Outcomes Short Term: Able to explain program exercise prescription;Long Term: Able to explain home exercise prescription to exercise independently                Exercise Goals Re-Evaluation :  Exercise Goals Re-Evaluation     Row Name 03/14/23 1113 04/07/23 0727           Exercise Goal Re-Evaluation   Exercise Goals Review  Increase Physical Activity;Able to understand and use Dyspnea scale;Understanding of Exercise Prescription;Increase Strength and Stamina;Knowledge and understanding of Target Heart Rate Range (THRR);Able to understand and use rate of perceived exertion (RPE) scale Increase Physical Activity;Able to understand and use Dyspnea scale;Understanding of Exercise Prescription;Increase Strength and Stamina;Knowledge and understanding of Target Heart Rate Range (THRR);Able to understand and use rate of perceived exertion (RPE) scale      Comments Arthur Harper is scheduled to exercise 11/26. Will continue to monitor and progress as able. Arthur Harper has completed 5 exercise sessions. He exercises for 15 min on the treadmill and recumbent elliptical. He averages 2.8 METs at 2.2 mph and 1% on the treadmill and 3.7 METs at level 3 on the recumbent elliptical. He performs the warmup and cooldown standing without limitations. Arthur Harper has increased his workloads for both exercise modes as METs have significantly increased. Arthur Harper tolerates progressions well. We have not discussed home exercise yet as Arthur Harper is the next patient I am planning to talk to. Will conitnue to monitor and progress as able.      Expected Outcomes Through exercise at rehab and home, the patient will decrease shortness of breath with daily activities and feel confident in carrying out an exercise regimen at home. Through exercise at rehab and home, the patient will decrease shortness of breath with daily activities and feel confident in carrying out an exercise regimen at home.                Discharge Exercise Prescription (Final Exercise Prescription Changes):  Exercise Prescription Changes - 04/01/23 1100       Response to Exercise   Blood Pressure (Admit) 122/68    Blood Pressure (Exercise) 140/70    Blood Pressure (Exit) 102/60    Heart Rate (Admit) 60 bpm    Heart Rate (Exercise) 78 bpm    Heart Rate (Exit) 72 bpm    Oxygen Saturation (Admit) 97 %    Oxygen Saturation (Exercise) 94 %    Oxygen Saturation (Exit) 93 %    Rating of Perceived Exertion (Exercise) 13    Perceived Dyspnea (Exercise) 2    Duration Continue with 30 min of aerobic exercise without signs/symptoms of physical distress.    Intensity THRR unchanged      Progression   Progression Continue to progress workloads to maintain intensity without signs/symptoms of physical distress.    Average METs 2.99      Resistance Training   Training Prescription Yes    Weight blue bands    Reps 10-15    Time 10 Minutes      Treadmill   MPH 2.2    Grade 1    Minutes 15    METs 2.99      Recumbant Elliptical   Level 3    Minutes 15    METs 2.2             Nutrition:  Target Goals: Understanding of nutrition guidelines, daily intake of sodium 1500mg , cholesterol 200mg , calories 30% from fat and 7% or less from saturated fats, daily to have 5 or more servings of fruits and vegetables.  Biometrics:  Pre Biometrics - 03/14/23 1138       Pre Biometrics   Grip Strength 32 kg              Nutrition Therapy Plan and Nutrition Goals:   Nutrition Assessments:  MEDIFICTS Score Key: >=70 Need to make dietary changes  40-70 Heart Healthy Diet <= 40 Therapeutic Level Cholesterol  Diet   Picture Your Plate Scores: <19 Unhealthy dietary pattern with much room for improvement. 41-50 Dietary pattern unlikely to meet recommendations for good health and room for improvement. 51-60 More healthful dietary pattern, with some room for improvement.  >60 Healthy dietary pattern,  although there may be some specific behaviors that could be improved.    Nutrition Goals Re-Evaluation:   Nutrition Goals Discharge (Final Nutrition Goals Re-Evaluation):   Psychosocial: Target Goals: Acknowledge presence or absence of significant depression and/or stress, maximize coping skills, provide positive support system. Participant is able to verbalize types and ability to use techniques and skills needed for reducing stress and depression.  Initial Review & Psychosocial Screening:  Initial Psych Review & Screening - 03/14/23 1025       Initial Review   Current issues with Current Depression;Current Psychotropic Meds;Current Stress Concerns    Source of Stress Concerns Family    Comments Pt is dealing with grief from recently losing one of his daughters. He is also dealing with family stress that has to do with his other daughter.      Family Dynamics   Good Support System? Yes    Concerns Recent loss of child      Barriers   Psychosocial barriers to participate in program The patient should benefit from training in stress management and relaxation.;Psychosocial barriers identified (see note)      Screening Interventions   Interventions Encouraged to exercise;To provide support and resources with identified psychosocial needs    Expected Outcomes Short Term goal: Utilizing psychosocial counselor, staff and physician to assist with identification of specific Stressors or current issues interfering with healing process. Setting desired goal for each stressor or current issue identified.;Long Term Goal: Stressors or current issues are controlled or eliminated.;Short Term goal: Identification and review with participant of any Quality of Life or Depression concerns found by scoring the questionnaire.;Long Term goal: The participant improves quality of Life and PHQ9 Scores as seen by post scores and/or verbalization of changes             Quality of Life Scores:  Scores of  19 and below usually indicate a poorer quality of life in these areas.  A difference of  2-3 points is a clinically meaningful difference.  A difference of 2-3 points in the total score of the Quality of Life Index has been associated with significant improvement in overall quality of life, self-image, physical symptoms, and general health in studies assessing change in quality of life.  PHQ-9: Review Flowsheet  More data exists      03/14/2023 12/10/2022 10/15/2022 07/15/2022 03/19/2022  Depression screen PHQ 2/9  Decreased Interest 0 0 0 0 0 0  Down, Depressed, Hopeless 1 0 0 0 0 0  PHQ - 2 Score 1 0 0 0 0 0  Altered sleeping 1 0 0 0 0 0  Tired, decreased energy 1 0 0 3 0 0  Change in appetite 1 0 0 1 0 0  Feeling bad or failure about yourself  0 0 0 0 0 0  Trouble concentrating 0 0 0 0 0 0  Moving slowly or fidgety/restless 0 0 0 0 0 0  Suicidal thoughts 0 0 0 0 0 0  PHQ-9 Score 4 0 0 4 0 0  Difficult doing work/chores Not difficult at all Not difficult at all Not difficult at all Not difficult at all - -    Details       Multiple values from one  day are sorted in reverse-chronological order        Interpretation of Total Score  Total Score Depression Severity:  1-4 = Minimal depression, 5-9 = Mild depression, 10-14 = Moderate depression, 15-19 = Moderately severe depression, 20-27 = Severe depression   Psychosocial Evaluation and Intervention:  Psychosocial Evaluation - 03/14/23 1026       Psychosocial Evaluation & Interventions   Interventions Encouraged to exercise with the program and follow exercise prescription;Stress management education    Comments Pt is dealing with grief from recently losing one of his daughters. He is also dealing with family stress that has to do with his other daughter. He is currently on psychotropic meds and is compliant with taking them. He also see's a counseler for his PTSD.    Expected Outcomes For Arthur Harper's depression and stress to be  decreased during the PR program    Continue Psychosocial Services  Follow up required by staff             Psychosocial Re-Evaluation:  Psychosocial Re-Evaluation     Row Name 03/17/23 0943 04/07/23 1316           Psychosocial Re-Evaluation   Current issues with Current Depression;Current Psychotropic Meds;Current Stress Concerns Current Depression;Current Psychotropic Meds;Current Stress Concerns      Comments Arthur Harper is scheduled to start PR on 03/18/23. No new psychosocial barriers or concerns since orientation on 03/14/23. Arthur Harper feels that his depression is stable at this time. He has a little stress due to the holiday's, but is handling it well. He is compliant with taking his psychotropic meds and meeting with his therapist. No new barriers or concerns at this time.      Expected Outcomes For Arthur Harper to participate in PR free of any psychosocial barriers or concerns For Arthur Harper to participate in PR free of any psychosocial barriers or concerns      Interventions Encouraged to attend Pulmonary Rehabilitation for the exercise Encouraged to attend Pulmonary Rehabilitation for the exercise      Continue Psychosocial Services  Follow up required by staff No Follow up required               Psychosocial Discharge (Final Psychosocial Re-Evaluation):  Psychosocial Re-Evaluation - 04/07/23 1316       Psychosocial Re-Evaluation   Current issues with Current Depression;Current Psychotropic Meds;Current Stress Concerns    Comments Arthur Harper feels that his depression is stable at this time. He has a little stress due to the holiday's, but is handling it well. He is compliant with taking his psychotropic meds and meeting with his therapist. No new barriers or concerns at this time.    Expected Outcomes For Arthur Harper to participate in PR free of any psychosocial barriers or concerns    Interventions Encouraged to attend Pulmonary Rehabilitation for the exercise    Continue  Psychosocial Services  No Follow up required             Education: Education Goals: Education classes will be provided on a weekly basis, covering required topics. Participant will state understanding/return demonstration of topics presented.  Learning Barriers/Preferences:  Learning Barriers/Preferences - 03/14/23 1026       Learning Barriers/Preferences   Learning Barriers Sight;Hearing   wears glasses and hearing aids   Learning Preferences Computer/Internet;Group Instruction;Individual Instruction;Skilled Demonstration;Written Material             Education Topics: Know Your Numbers Group instruction that is supported by a PowerPoint presentation. Instructor discusses importance of knowing and  understanding resting, exercise, and post-exercise oxygen saturation, heart rate, and blood pressure. Oxygen saturation, heart rate, blood pressure, rating of perceived exertion, and dyspnea are reviewed along with a normal range for these values.    Exercise for the Pulmonary Patient Group instruction that is supported by a PowerPoint presentation. Instructor discusses benefits of exercise, core components of exercise, frequency, duration, and intensity of an exercise routine, importance of utilizing pulse oximetry during exercise, safety while exercising, and options of places to exercise outside of rehab.  Flowsheet Row PULMONARY REHAB OTHER RESPIRATORY from 09/12/2016 in [facility]  Date 08/08/16  Educator EP  Instruction Review Code (Retired) R- Review/reinforce       MET Level  Group instruction provided by PowerPoint, verbal discussion, and written material to support subject matter. Instructor reviews what METs are and how to increase METs.    Pulmonary Medications Verbally interactive group education provided by instructor with focus on inhaled medications and proper administration. Flowsheet Row PULMONARY  REHAB CHRONIC OBSTRUCTIVE PULMONARY DISEASE from 04/10/2023 in [facility]  Date 04/10/23  Educator RT  Instruction Review Code 1- Verbalizes Understanding       Anatomy and Physiology of the Respiratory System Group instruction provided by PowerPoint, verbal discussion, and written material to support subject matter. Instructor reviews respiratory cycle and anatomical components of the respiratory system and their functions. Instructor also reviews differences in obstructive and restrictive respiratory diseases with examples of each.  Flowsheet Row PULMONARY REHAB CHRONIC OBSTRUCTIVE PULMONARY DISEASE from 04/03/2023 in [facility], [facility]  Date 04/03/23  Educator RT  Instruction Review Code 1- Verbalizes Understanding       Oxygen Safety Group instruction provided by PowerPoint, verbal discussion, and written material to support subject matter. There is an overview of "What is Oxygen" and "Why do we need it".  Instructor also reviews how to create a safe environment for oxygen use, the importance of using oxygen as prescribed, and the risks of noncompliance. There is a brief discussion on traveling with oxygen and resources the patient may utilize. Flowsheet Row PULMONARY REHAB OTHER RESPIRATORY from 09/12/2016 in [facility], [facility]  Date 07/25/16  Educator Daniel Nones  Instruction Review Code (Retired) 2- meets goals/outcomes       Oxygen Use Group instruction provided by PowerPoint, verbal discussion, and written material to discuss how supplemental oxygen is prescribed and different types of oxygen supply systems. Resources for more information are provided.    Breathing Techniques Group instruction that is supported by demonstration and informational handouts. Instructor discusses the benefits of pursed lip and diaphragmatic  breathing and detailed demonstration on how to perform both.     Risk Factor Reduction Group instruction that is supported by a PowerPoint presentation. Instructor discusses the definition of a risk factor, different risk factors for pulmonary disease, and how the heart and lungs work together. Flowsheet Row PULMONARY REHAB OTHER RESPIRATORY from 09/12/2016 in [facility], Vascular, & [facility]  Date 07/11/16  Educator EP  Instruction Review Code (Retired) 2- meets goals/outcomes       Pulmonary Diseases Group instruction provided by PowerPoint, verbal discussion, and written material to support subject matter. Instructor gives an overview of the different type of pulmonary diseases. There is also a discussion on risk factors and symptoms as well  as ways to manage the diseases. Flowsheet Row PULMONARY REHAB CHRONIC OBSTRUCTIVE PULMONARY DISEASE from 03/27/2023 in [facility] for Heart, Vascular, & Lung Health  Date 03/27/23  Educator RT  Instruction Review Code 1- Verbalizes Understanding       Stress and Energy Conservation Group instruction provided by PowerPoint, verbal discussion, and written material to support subject matter. Instructor gives an overview of stress and the impact it can have on the body. Instructor also reviews ways to reduce stress. There is also a discussion on energy conservation and ways to conserve energy throughout the day.   Warning Signs and Symptoms Group instruction provided by PowerPoint, verbal discussion, and written material to support subject matter. Instructor reviews warning signs and symptoms of stroke, heart attack, cold and flu. Instructor also reviews ways to prevent the spread of infection.   Other Education Group or individual verbal, written, or video instructions that support the educational goals of the pulmonary rehab program.    Knowledge Questionnaire Score:  Knowledge  Questionnaire Score - 03/14/23 1051       Knowledge Questionnaire Score   Pre Score 16/18             Core Components/Risk Factors/Patient Goals at Admission:  Personal Goals and Risk Factors at Admission - 03/14/23 1027       Core Components/Risk Factors/Patient Goals on Admission   Improve shortness of breath with ADL's Yes    Intervention Provide education, individualized exercise plan and daily activity instruction to help decrease symptoms of SOB with activities of daily living.    Expected Outcomes Short Term: Improve cardiorespiratory fitness to achieve a reduction of symptoms when performing ADLs;Long Term: Be able to perform more ADLs without symptoms or delay the onset of symptoms    Increase knowledge of respiratory medications and ability to use respiratory devices properly  Yes    Intervention Provide education and demonstration as needed of appropriate use of medications, inhalers, and oxygen therapy.    Expected Outcomes Short Term: Achieves understanding of medications use. Understands that oxygen is a medication prescribed by physician. Demonstrates appropriate use of inhaler and oxygen therapy.;Long Term: Maintain appropriate use of medications, inhalers, and oxygen therapy.             Core Components/Risk Factors/Patient Goals Review:   Goals and Risk Factor Review     Row Name 03/17/23 0947 04/07/23 1318           Core Components/Risk Factors/Patient Goals Review   Personal Goals Review Improve shortness of breath with ADL's;Develop more efficient breathing techniques such as purse lipped breathing and diaphragmatic breathing and practicing self-pacing with activity.;Increase knowledge of respiratory medications and ability to use respiratory devices properly. Improve shortness of breath with ADL's;Develop more efficient breathing techniques such as purse lipped breathing and diaphragmatic breathing and practicing self-pacing with activity.;Increase  knowledge of respiratory medications and ability to use respiratory devices properly.      Review Arthur Harper is scheduled to start PR on 03/18/23. Goal progressing for improve shortness of breath with ADL's. Goal progressing for developing more efficient breathing techniques such as purse lipped breathing and diaphragmatic breathing; and practicing self-pacing with activity. Goal progressing for increasing knowledge of respiratory medications and ability to use respiratory devices properly. Goal progressing for improve shortness of breath with ADL's. Arthur Harper remains on RA while exercising with sats >88%. Goal progressing for developing more efficient breathing techniques such as purse lipped breathing and diaphragmatic breathing; and practicing self-pacing with activity.  Goal met for increasing knowledge of respiratory medications and ability to use respiratory devices properly. Arthur Harper is able to demonstrate proper technique with MDI to staff. We will continue to monitor Arthur Harper progress throughout the program.      Expected Outcomes For Arthur Harper to improve shortness of breath with ADL's, develop more efficient breathing techniques such as purse lipped breathing and diaphragmatic breathing; and practicing self-pacing with activity and increase knowledge of respiratory medications and ability to use respiratory devices properly. For Arthur Harper to improve shortness of breath with ADL's and develop more efficient breathing techniques such as purse lipped breathing and diaphragmatic breathing; and practicing self-pacing with activity.               Core Components/Risk Factors/Patient Goals at Discharge (Final Review):   Goals and Risk Factor Review - 04/07/23 1318       Core Components/Risk Factors/Patient Goals Review   Personal Goals Review Improve shortness of breath with ADL's;Develop more efficient breathing techniques such as purse lipped breathing and diaphragmatic breathing and practicing  self-pacing with activity.;Increase knowledge of respiratory medications and ability to use respiratory devices properly.    Review Goal progressing for improve shortness of breath with ADL's. Arthur Harper remains on RA while exercising with sats >88%. Goal progressing for developing more efficient breathing techniques such as purse lipped breathing and diaphragmatic breathing; and practicing self-pacing with activity. Goal met for increasing knowledge of respiratory medications and ability to use respiratory devices properly. Arthur Harper is able to demonstrate proper technique with MDI to staff. We will continue to monitor Arthur Harper progress throughout the program.    Expected Outcomes For Arthur Harper to improve shortness of breath with ADL's and develop more efficient breathing techniques such as purse lipped breathing and diaphragmatic breathing; and practicing self-pacing with activity.             ITP Comments: Pt is making expected progress toward Pulmonary Rehab goals after completing 7 session(s). Recommend continued exercise, life style modification, education, and utilization of breathing techniques to increase stamina and strength, while also decreasing shortness of breath with exertion.     Comments: Dr. Mechele Collin is Medical Director for Pulmonary Rehab at [facility].

## 2023-04-15 NOTE — Progress Notes (Signed)
Daily Session Note  Patient Details  Name: Arthur Harper MRN: [id number] Date of Birth: 10-01-1945 Referring Provider:   Doristine Devoid Pulmonary Rehab Walk Test from 03/14/2023 in [facility]  Referring Provider [name]  [[name]]       Encounter Date: 04/15/2023  Check In:  Session Check In - 04/15/23 1028       Check-In   Supervising physician immediately available to respond to emergencies [facility] MD immediately available    Physician(s) Edd Fabian, NP    Location [facility]    Staff Present Essie Hart, RN, BSN;Randi Idelle Crouch BS, ACSM-CEP, Exercise Physiologist;Casey Katrinka Blazing, RT    Virtual Visit No    Medication changes reported     No    Fall or balance concerns reported    No    Tobacco Cessation No Change    Warm-up and Cool-down Performed as group-led instruction    Resistance Training Performed Yes    VAD Patient? No    PAD/SET Patient? No      Pain Assessment   Currently in Pain? No/denies    Multiple Pain Sites No             Capillary Blood Glucose: No results found for this or any previous visit (from the past 24 hours).   Exercise Prescription Changes - 04/15/23 1100       Response to Exercise   Blood Pressure (Admit) 140/60    Blood Pressure (Exercise) 132/66    Blood Pressure (Exit) 120/70    Heart Rate (Admit) 74 bpm    Heart Rate (Exercise) 95 bpm    Heart Rate (Exit) 79 bpm    Oxygen Saturation (Admit) 96 %    Oxygen Saturation (Exercise) 93 %    Oxygen Saturation (Exit) 95 %    Rating of Perceived Exertion (Exercise) 12    Perceived Dyspnea (Exercise) 2    Duration Continue with 30 min of aerobic exercise without signs/symptoms of physical distress.    Intensity THRR unchanged      Progression   Progression Continue to progress workloads to maintain intensity without signs/symptoms of physical distress.      Resistance Training   Training Prescription  Yes    Weight black bands    Reps 10-15    Time 10 Minutes      Treadmill   MPH 2.5    Grade 1    Minutes 15    METs 3.26      Recumbant Elliptical   Level 4    RPM 55    Watts 73    Minutes 15    METs 2.5             Social History   Tobacco Use  Smoking Status Former   Current packs/day: 0.00   Average packs/day: 1 pack/day for 40.0 years (40.0 ttl pk-yrs)   Types: Cigarettes   Start date: 12/31/1968   Quit date: 12/31/2008   Years since quitting: 14.2  Smokeless Tobacco Never  Tobacco Comments   10 cigs per day    Goals Met:  Independence with exercise equipment Exercise tolerated well No report of concerns or symptoms today Strength training completed today  Goals Unmet:  Not Applicable  Comments: Service time is from 1014 to 1146    Dr. Mechele Collin is Medical Director for Pulmonary Rehab at [facility].

## 2023-04-17 ENCOUNTER — Encounter (HOSPITAL_COMMUNITY)
Admission: RE | Admit: 2023-04-17 | Discharge: 2023-04-17 | Disposition: A | Discharge status: Home / Self Care | Payer: No Typology Code available for payment source | Source: Ambulatory Visit | Attending: Pulmonary Disease | Admitting: Pulmonary Disease

## 2023-04-17 DIAGNOSIS — Z5189 Encounter for other specified aftercare: Secondary | ICD-10-CM | POA: Diagnosis not present

## 2023-04-17 DIAGNOSIS — J449 Chronic obstructive pulmonary disease, unspecified: Secondary | ICD-10-CM

## 2023-04-17 NOTE — Progress Notes (Signed)
Daily Session Note  Patient Details  Name: Arthur Harper MRN: [id number] Date of Birth: [birth year] Referring Provider:   Doristine Devoid Pulmonary Rehab Walk Test from 03/14/2023 in [facility], [facility]  Referring Provider [name]  [[name]]       Encounter Date: 04/17/2023  Check In:  Session Check In - 04/17/23 1033       Check-In   Supervising physician immediately available to respond to emergencies [facility] MD immediately available    Physician(s) Reather Littler, NP    Location [facility]    Staff Present Essie Hart, RN, BSN;Randi Idelle Crouch BS, ACSM-CEP, Exercise Physiologist;Casey Thedore Mins, RN, BSN    Virtual Visit No    Medication changes reported     No    Fall or balance concerns reported    No    Tobacco Cessation No Change    Warm-up and Cool-down Performed as group-led Writer Performed Yes    VAD Patient? No    PAD/SET Patient? No      Pain Assessment   Currently in Pain? No/denies    Multiple Pain Sites No             Capillary Blood Glucose: No results found for this or any previous visit (from the past 24 hours).    Social History   Tobacco Use  Smoking Status Former   Current packs/day: 0.00   Average packs/day: 1 pack/day for 40.0 years (40.0 ttl pk-yrs)   Types: Cigarettes   Start date: 12/31/1968   Quit date: 12/31/2008   Years since quitting: 14.3  Smokeless Tobacco Never  Tobacco Comments   10 cigs per day    Goals Met:  Exercise tolerated well No report of concerns or symptoms today Strength training completed today  Goals Unmet:  Not Applicable  Comments: Service time is from 1017 to 1155    Dr. Mechele Collin is Medical Director for Pulmonary Rehab at [facility].

## 2023-04-22 ENCOUNTER — Encounter (HOSPITAL_COMMUNITY)
Admission: RE | Admit: 2023-04-22 | Discharge: 2023-04-22 | Disposition: A | Discharge status: Home / Self Care | Payer: No Typology Code available for payment source | Source: Ambulatory Visit | Attending: Pulmonary Disease | Admitting: Pulmonary Disease

## 2023-04-22 DIAGNOSIS — J449 Chronic obstructive pulmonary disease, unspecified: Secondary | ICD-10-CM

## 2023-04-22 DIAGNOSIS — Z5189 Encounter for other specified aftercare: Secondary | ICD-10-CM | POA: Diagnosis not present

## 2023-04-22 NOTE — Progress Notes (Signed)
 Daily Session Note  Patient Details  Name: Arthur Harper MRN: [id number] Date of Birth: [birth year] Referring Provider:   Conrad Ports Pulmonary Rehab Walk Test from 03/14/2023 in [facility], [facility], [facility]  Referring Provider [name]  [[name]]       Encounter Date: 04/22/2023  Check In:  Session Check In - 04/22/23 1108       Check-In   Supervising physician immediately available to respond to emergencies [facility] MD immediately available    Physician(s) Orren fabry, NP    [facility]    Staff Present Ronal Levin, RN, BSN;Randi Reeve BS, ACSM-CEP, Exercise Physiologist;Casey Claudene, RT    Virtual Visit No    Medication changes reported     No    Fall or balance concerns reported    No    Tobacco Cessation No Change    Warm-up and Cool-down Performed as group-led instruction    Resistance Training Performed Yes    VAD Patient? No    PAD/SET Patient? No      Pain Assessment   Currently in Pain? No/denies    Multiple Pain Sites No             Capillary Blood Glucose: No results found for this or any previous visit (from the past 24 hours).    Social History   Tobacco Use  Smoking Status Former   Current packs/day: 0.00   Average packs/day: 1 pack/day for 40.0 years (40.0 ttl pk-yrs)   Types: Cigarettes   Start date: 12/31/1968   Quit date: 12/31/2008   Years since quitting: 14.3  Smokeless Tobacco Never  Tobacco Comments   10 cigs per day    Goals Met:  Independence with exercise equipment Exercise tolerated well No report of concerns or symptoms today Strength training completed today  Goals Unmet:  Not Applicable  Comments: Service time is from 1013 to 1140    Dr. Slater Staff is Medical Director for Pulmonary Rehab at [facility].

## 2023-04-24 ENCOUNTER — Encounter (HOSPITAL_COMMUNITY)
Admission: RE | Admit: 2023-04-24 | Discharge: 2023-04-24 | Disposition: A | Discharge status: Home / Self Care | Payer: No Typology Code available for payment source | Source: Ambulatory Visit | Attending: Pulmonary Disease | Admitting: Pulmonary Disease

## 2023-04-24 DIAGNOSIS — J449 Chronic obstructive pulmonary disease, unspecified: Secondary | ICD-10-CM | POA: Diagnosis present

## 2023-04-24 NOTE — Progress Notes (Signed)
 Daily Session Note  Patient Details  Name: Arthur Harper MRN: [id number] Date of Birth: June 26, 1945 Referring Provider:   Conrad Ports Pulmonary Rehab Walk Test from 03/14/2023 in [facility] for Heart, Vascular, & Lung Health  Referring Provider [name]  [[name]]       Encounter Date: 04/24/2023  Check In:  Session Check In - 04/24/23 1115       Check-In   Supervising physician immediately available to respond to emergencies [facility] immediately available    Physician(s) Rosaline Skains, NP    Location [facility]    Staff Present Ronal Levin, RN, BSN;Randi Midge BS, ACSM-CEP, Exercise Physiologist;Adelise Buswell Claudene, RT    Virtual Visit No    Medication changes reported     No    Fall or balance concerns reported    No    Tobacco Cessation No Change    Warm-up and Cool-down Performed as group-led instruction    Resistance Training Performed Yes    VAD Patient? No    PAD/SET Patient? No      Pain Assessment   Currently in Pain? No/denies    Multiple Pain Sites No             Capillary Blood Glucose: No results found for this or any previous visit (from the past 24 hours).    Social History   Tobacco Use  Smoking Status Former   Current packs/day: 0.00   Average packs/day: 1 pack/day for 40.0 years (40.0 ttl pk-yrs)   Types: Cigarettes   Start date: 12/31/1968   Quit date: 12/31/2008   Years since quitting: 14.3  Smokeless Tobacco Never  Tobacco Comments   10 cigs per day    Goals Met:  Proper associated with RPD/PD & O2 Sat Independence with exercise equipment Exercise tolerated well No report of concerns or symptoms today Strength training completed today  Goals Unmet:  Not Applicable  Comments: Service time is from 1019 to 1152.    Dr. Slater Staff is Medical Director for Pulmonary Rehab at [facility].

## 2023-04-29 ENCOUNTER — Encounter (HOSPITAL_COMMUNITY)
Admission: RE | Admit: 2023-04-29 | Discharge: 2023-04-29 | Disposition: A | Discharge status: Home / Self Care | Payer: No Typology Code available for payment source | Source: Ambulatory Visit | Attending: Pulmonary Disease | Admitting: Pulmonary Disease

## 2023-04-29 ENCOUNTER — Ambulatory Visit (INDEPENDENT_AMBULATORY_CARE_PROVIDER_SITE_OTHER): Payer: Medicare HMO | Admitting: Family Medicine

## 2023-04-29 ENCOUNTER — Encounter: Payer: Self-pay | Admitting: Family Medicine

## 2023-04-29 VITALS — Wt 239.6 lb

## 2023-04-29 VITALS — BP 121/79 | HR 58 | Ht 69.0 in | Wt 238.1 lb

## 2023-04-29 DIAGNOSIS — Z9189 Other specified personal risk factors, not elsewhere classified: Secondary | ICD-10-CM | POA: Diagnosis not present

## 2023-04-29 DIAGNOSIS — K219 Gastro-esophageal reflux disease without esophagitis: Secondary | ICD-10-CM

## 2023-04-29 DIAGNOSIS — J449 Chronic obstructive pulmonary disease, unspecified: Secondary | ICD-10-CM | POA: Diagnosis not present

## 2023-04-29 DIAGNOSIS — I2510 Atherosclerotic heart disease of native coronary artery without angina pectoris: Secondary | ICD-10-CM | POA: Diagnosis not present

## 2023-04-29 DIAGNOSIS — I2583 Coronary atherosclerosis due to lipid rich plaque: Secondary | ICD-10-CM | POA: Diagnosis not present

## 2023-04-29 DIAGNOSIS — R911 Solitary pulmonary nodule: Secondary | ICD-10-CM | POA: Diagnosis not present

## 2023-04-29 NOTE — Assessment & Plan Note (Signed)
 Has started on a statin.  Tolerating it.  LDL is at goal.  Continue atorvastatin 20 mg.

## 2023-04-29 NOTE — Patient Instructions (Addendum)
 It was nice to see you today,  We addressed the following topics today: -Your cholesterol was better after starting the atorvastatin . - You are due for your repeat lung cancer screening in February.  If no one calls you to schedule this let us  know and we will send in a new order.    Have a great day,  Rolan Slain, MD

## 2023-04-29 NOTE — Progress Notes (Signed)
 Daily Session Note  Patient Details  Name: Arthur Harper MRN: [id number] Date of Birth: 1945-05-21 Referring Provider:   Conrad Ports Pulmonary Rehab Walk Test from 03/14/2023 in [facility]  Referring Provider [name]  [[name]]       Encounter Date: 04/29/2023  Check In:  Session Check In - 04/29/23 1450       Check-In   Supervising physician immediately available to respond to emergencies [facility] MD immediately available    Physician(s) Rosaline Skains, NP    Location [facility]    Staff Present Ronal Levin, RN, BSN;Randi Midge BS, ACSM-CEP, Exercise Physiologist;Casey Claudene Neita Moats, MS, ACSM-CEP, Exercise Physiologist    Virtual Visit No    Medication changes reported     No    Fall or balance concerns reported    No    Tobacco Cessation No Change    Warm-up and Cool-down Performed as group-led instruction    Resistance Training Performed Yes    VAD Patient? No    PAD/SET Patient? No      Pain Assessment   Currently in Pain? No/denies    Pain Score 0-No pain    Multiple Pain Sites No             Capillary Blood Glucose: No results found for this or any previous visit (from the past 24 hours).   Exercise Prescription Changes - 04/29/23 1400       Response to Exercise   Blood Pressure (Admit) 108/60    Blood Pressure (Exercise) 182/70    Blood Pressure (Exit) 94/60    Heart Rate (Admit) 73 bpm    Heart Rate (Exercise) 92 bpm    Heart Rate (Exit) 69 bpm    Oxygen Saturation (Admit) 95 %    Oxygen Saturation (Exercise) 93 %    Oxygen Saturation (Exit) 96 %    Rating of Perceived Exertion (Exercise) 13    Perceived Dyspnea (Exercise) 2    Duration Continue with 30 min of aerobic exercise without signs/symptoms of physical distress.    Intensity THRR unchanged      Progression   Progression Continue to progress workloads to maintain intensity without signs/symptoms of  physical distress.      Resistance Training   Training Prescription Yes    Weight black bands    Reps 10-15    Time 10 Minutes      Treadmill   MPH 2.5    Grade 2    Minutes 15    METs 3.6      Recumbant Elliptical   Level 8   2-8   Minutes 15    METs 2.9             Social History   Tobacco Use  Smoking Status Former   Current packs/day: 0.00   Average packs/day: 1 pack/day for 40.0 years (40.0 ttl pk-yrs)   Types: Cigarettes   Start date: 12/31/1968   Quit date: 12/31/2008   Years since quitting: 14.3  Smokeless Tobacco Never  Tobacco Comments   10 cigs per day    Goals Met:  Proper associated with RPD/PD & O2 Sat Exercise tolerated well No report of concerns or symptoms today Strength training completed today  Goals Unmet:  Not Applicable  Comments: Service time is from 1310 to 1437.    Dr. Slater Staff is Medical Director for Pulmonary Rehab at [facility].

## 2023-04-29 NOTE — Progress Notes (Signed)
   Established Patient Office Visit  Subjective   Patient ID: Arthur Harper, male    DOB: 09-Feb-1946  Age: 78 y.o. MRN: [id number]  Chief Complaint  Patient presents with   Medical Management of Chronic Issues    HPI  Coronary artery disease-discussed the atorvastatin  that the patient was placed on.  Tolerates it well.  No concerns.  Discussed his updated LDL that was much better and at goal.  Reflux-we discussed the Prilosec and Pepcid  that his pulmonologist prescribed for his chronic cough.  He takes these medications but does miss doses occasionally.  States that overall it helps with his symptoms.  COPD-the patient takes Stiolto but also states he takes a blue inhaler that was not on our list.  Cannot remember the name of it.  [facility] in [facility].  Pulmonary nodules-we discussed the patient's pulmonary nodules seen on his last CT scan and a 6 months follow-up recommendation that is coming due in February.  Patient states that his daughter passed away a few months ago.  He has talked to a therapist about this.  Does not want to change his citalopram .    The 10-year ASCVD risk score (Arnett DK, et al., 2019) is: 21.1%  Health Maintenance Due  Topic Date Due   COVID-19 Vaccine (1) Never done      Objective:     BP 121/79   Pulse (!) 58   Ht 5' 9 (1.753 m)   Wt 238 lb 1.9 oz (108 kg)   SpO2 96%   BMI 35.16 kg/m    Physical Exam General: Alert, oriented CV: Rate rhythm no murmurs Pulmonary: Lungs clear bilaterally no wheeze or crackles.   No results found for any visits on 04/29/23.      Assessment & Plan:   Gastroesophageal reflux disease without esophagitis Assessment & Plan: Taking Prilosec and Pepcid  but misses doses occasionally.   Pulmonary nodule less than 1 cm in diameter with moderate to high risk for malignant neoplasm Assessment & Plan: 29-month  follow-up for lung cancer screening coming up in February.  Order placed for CT scan.  Orders: -     CT CHEST LCS NODULE F/U LOW DOSE WO CONTRAST; Future  Coronary artery disease due to lipid rich plaque Assessment & Plan: Has started on a statin.  Tolerating it.  LDL is at goal.  Continue atorvastatin  20 mg.      Return in about 6 months (around 10/27/2023) for hld, follow-up conditions.    Arthur MARLA Slain, MD

## 2023-04-29 NOTE — Assessment & Plan Note (Signed)
 42-month follow-up for lung cancer screening coming up in February.  Order placed for CT scan.

## 2023-04-29 NOTE — Assessment & Plan Note (Signed)
 Taking Prilosec and Pepcid but misses doses occasionally.

## 2023-05-01 ENCOUNTER — Encounter (HOSPITAL_COMMUNITY)
Admission: RE | Admit: 2023-05-01 | Discharge: 2023-05-01 | Disposition: A | Discharge status: Home / Self Care | Payer: No Typology Code available for payment source | Source: Ambulatory Visit | Attending: Pulmonary Disease | Admitting: Pulmonary Disease

## 2023-05-01 DIAGNOSIS — J449 Chronic obstructive pulmonary disease, unspecified: Secondary | ICD-10-CM | POA: Diagnosis not present

## 2023-05-01 NOTE — Progress Notes (Signed)
 Daily Session Note  Patient Details  Name: Arthur Harper MRN: [id number] Date of Birth: Feb 05, 1946 Referring Provider:   Conrad Ports Pulmonary Rehab Walk Test from 03/14/2023 in [facility], [facility], & Lung [facility]  Referring Provider [name]  [[name]]       Encounter Date: 05/01/2023  Check In:  Session Check In - 05/01/23 1442       Check-In   Supervising physician immediately available to respond to emergencies [facility] MD immediately available    Physician(s) Adrien Hila, NP    [facility]    Staff Present Ronal Levin, RN, BSN;Randi Midge BS, ACSM-CEP, Exercise Physiologist;Casey Claudene Neita Moats, MS, ACSM-CEP, Exercise Physiologist    Virtual Visit No    Medication changes reported     No    Fall or balance concerns reported    No    Tobacco Cessation No Change    Warm-up and Cool-down Performed as group-led instruction    Resistance Training Performed Yes    VAD Patient? No    PAD/SET Patient? No      Pain Assessment   Currently in Pain? No/denies    Pain Score 0-No pain    Multiple Pain Sites No             Capillary Blood Glucose: No results found for this or any previous visit (from the past 24 hours).    Social History   Tobacco Use  Smoking Status Former   Current packs/day: 0.00   Average packs/day: 1 pack/day for 40.0 years (40.0 ttl pk-yrs)   Types: Cigarettes   Start date: 12/31/1968   Quit date: 12/31/2008   Years since quitting: 14.3  Smokeless Tobacco Never  Tobacco Comments   10 cigs per day    Goals Met:  Independence with exercise equipment Exercise tolerated well No report of concerns or symptoms today Strength training completed today  Goals Unmet:  Not Applicable  Comments: Service time is from 1307 to 1426    Dr. Slater Staff is Medical Director for Pulmonary Rehab at [facility].

## 2023-05-06 ENCOUNTER — Encounter (HOSPITAL_COMMUNITY)
Admission: RE | Admit: 2023-05-06 | Discharge: 2023-05-06 | Disposition: A | Discharge status: Home / Self Care | Payer: No Typology Code available for payment source | Source: Ambulatory Visit | Attending: Pulmonary Disease | Admitting: Pulmonary Disease

## 2023-05-06 DIAGNOSIS — J449 Chronic obstructive pulmonary disease, unspecified: Secondary | ICD-10-CM

## 2023-05-06 NOTE — Progress Notes (Signed)
 Daily Session Note  Patient Details  Name: Arthur Harper MRN: [id number] Date of Birth: 1945-07-14 Referring Provider:   Conrad Ports Pulmonary Rehab Walk Test from 03/14/2023 in [facility], Vascular, & [facility]  Referring Provider [name]  [[name]]       Encounter Date: 05/06/2023  Check In:  Session Check In - 05/06/23 1030       Check-In   Supervising physician immediately available to respond to emergencies [facility] MD immediately available    Physician(s) Lamarr Satterfield, NP    [facility]    Staff Present Ronal Levin, RN, BSN;Randi Midge HECKLE, ACSM-CEP, Exercise Physiologist;Latika Kronick Claudene Neita Moats, MS, ACSM-CEP, Exercise Physiologist    Virtual Visit No    Medication changes reported     No    Fall or balance concerns reported    No    Tobacco Cessation No Change    Warm-up and Cool-down Performed as group-led instruction    Resistance Training Performed Yes    VAD Patient? No    PAD/SET Patient? No      Pain Assessment   Currently in Pain? No/denies    Multiple Pain Sites No             Capillary Blood Glucose: No results found for this or any previous visit (from the past 24 hours).    Social History   Tobacco Use  Smoking Status Former   Current packs/day: 0.00   Average packs/day: 1 pack/day for 40.0 years (40.0 ttl pk-yrs)   Types: Cigarettes   Start date: 12/31/1968   Quit date: 12/31/2008   Years since quitting: 14.3  Smokeless Tobacco Never  Tobacco Comments   10 cigs per day    Goals Met:  Proper associated with RPD/PD & O2 Sat Independence with exercise equipment Exercise tolerated well No report of concerns or symptoms today Strength training completed today  Goals Unmet:  Not Applicable  Comments: Service time is from 1019 to 1135.    Dr. Slater Staff is Medical Director for Pulmonary Rehab at [facility].

## 2023-05-08 ENCOUNTER — Encounter (HOSPITAL_COMMUNITY)
Admission: RE | Admit: 2023-05-08 | Discharge: 2023-05-08 | Disposition: A | Discharge status: Home / Self Care | Payer: No Typology Code available for payment source | Source: Ambulatory Visit | Attending: Pulmonary Disease | Admitting: Pulmonary Disease

## 2023-05-08 DIAGNOSIS — J449 Chronic obstructive pulmonary disease, unspecified: Secondary | ICD-10-CM | POA: Diagnosis not present

## 2023-05-08 NOTE — Progress Notes (Signed)
Daily Session Note  Patient Details  Name: Arthur Harper MRN: [id number] Date of Birth: 1946-03-24 Referring Provider:   Doristine Devoid Pulmonary Rehab Walk Test from 03/14/2023 in [facility] for [facility], Vascular, & Lung Health  Referring Provider [name]  [[name]]       Encounter Date: 05/08/2023  Check In:  Session Check In - 05/08/23 1029       Check-In   Supervising physician immediately available to respond to emergencies [facility] MD immediately available    Physician(s) Joni Reining, NP    [facility]    Staff Present Essie Hart, RN, BSN;Randi Dionisio Paschal, ACSM-CEP, Exercise Physiologist;Casey Charlean Sanfilippo, MS, ACSM-CEP, Exercise Physiologist    Virtual Visit No    Medication changes reported     No    Fall or balance concerns reported    No    Tobacco Cessation No Change    Warm-up and Cool-down Performed as group-led instruction    Resistance Training Performed Yes    VAD Patient? No    PAD/SET Patient? No      Pain Assessment   Currently in Pain? No/denies    Pain Score 0-No pain    Multiple Pain Sites No             Capillary Blood Glucose: No results found for this or any previous visit (from the past 24 hours).    Social History   Tobacco Use  Smoking Status Former   Current packs/day: 0.00   Average packs/day: 1 pack/day for 40.0 years (40.0 ttl pk-yrs)   Types: Cigarettes   Start date: 12/31/1968   Quit date: 12/31/2008   Years since quitting: 14.3  Smokeless Tobacco Never  Tobacco Comments   10 cigs per day    Goals Met:  Proper associated with RPD/PD & O2 Sat Exercise tolerated well No report of concerns or symptoms today Strength training completed today  Goals Unmet:  Not Applicable  Comments: Service time is from 1015 to 1133.    Dr. Mechele Collin is Medical Director for Pulmonary Rehab at [facility].

## 2023-05-09 DIAGNOSIS — R0981 Nasal congestion: Secondary | ICD-10-CM | POA: Diagnosis not present

## 2023-05-09 DIAGNOSIS — R509 Fever, unspecified: Secondary | ICD-10-CM | POA: Diagnosis not present

## 2023-05-09 DIAGNOSIS — R051 Acute cough: Secondary | ICD-10-CM | POA: Diagnosis not present

## 2023-05-09 NOTE — Telephone Encounter (Signed)
Called pt he is advised about the recommendation he is agreeable

## 2023-05-09 NOTE — Telephone Encounter (Signed)
Unfortunately, she will need to be evaluated in order to determine if antibiotics are appropriate.  I would recommend that she schedules an appointment with another primary care office or goes to urgent care.  Alternatively, she can use over-the-counter medication over the weekend to see if it improves prior to being evaluated.

## 2023-05-09 NOTE — Telephone Encounter (Signed)
Copied from CRM [id number]. Topic: Clinical - Medication Question >> May 09, 2023  9:57 AM Arthur Harper wrote: Pt is having a bad cough and would like some antibiotics  sent to [facility] - [place], [place] - [address]

## 2023-05-13 ENCOUNTER — Ambulatory Visit (INDEPENDENT_AMBULATORY_CARE_PROVIDER_SITE_OTHER): Payer: Medicare HMO | Admitting: Family Medicine

## 2023-05-13 ENCOUNTER — Encounter (HOSPITAL_COMMUNITY)
Admission: RE | Admit: 2023-05-13 | Discharge: 2023-05-13 | Disposition: A | Discharge status: Home / Self Care | Payer: No Typology Code available for payment source | Source: Ambulatory Visit | Attending: Pulmonary Disease | Admitting: Pulmonary Disease

## 2023-05-13 ENCOUNTER — Encounter: Payer: Self-pay | Admitting: Family Medicine

## 2023-05-13 VITALS — BP 124/64 | HR 56 | Temp 97.9°F | Ht 69.0 in | Wt 243.8 lb

## 2023-05-13 DIAGNOSIS — J069 Acute upper respiratory infection, unspecified: Secondary | ICD-10-CM | POA: Diagnosis not present

## 2023-05-13 NOTE — Progress Notes (Signed)
   Acute Office Visit  Subjective:     Patient ID: Arthur Harper, male    DOB: [birth year], 78 y.o.   MRN: [id number]  Chief Complaint  Patient presents with   URI    Cold Symptoms    HPI Patient is in today for URI.  Medical history includes COPD, chronic rhinitis, history of smoking.  He went to urgent care on [address] 4 days ago and was prescribed a course of prednisone, Augmentin, and promethazine.  He is trying to stay proactive given his history of COPD to prevent symptoms from progressing.  He is currently on Stiolto for COPD maintenance treatment and has an albuterol rescue inhaler.  URI symptoms include cough, nasal congestion.  Denies fever or chills.  Symptoms have not worsened since starting the medications prescribed at urgent care.  ROS See HPI    Objective:    BP 124/64   Pulse (!) 56   Temp 97.9 F (36.6 C) (Oral)   Ht 5\' 9"  (1.753 m)   Wt 243 lb 12 oz (110.6 kg)   SpO2 95%   BMI 36.00 kg/m   Physical Exam Constitutional:      General: He is not in acute distress.    Appearance: Normal appearance.  HENT:     Head: Normocephalic and atraumatic.  Cardiovascular:     Rate and Rhythm: Normal rate and regular rhythm.     Heart sounds: Normal heart sounds. No murmur heard.    No friction rub. No gallop.  Pulmonary:     Effort: Pulmonary effort is normal. No respiratory distress.     Breath sounds: Wheezing (Expiratory wheezes particularly in RLL and LLL) present. No rhonchi or rales.  Skin:    General: Skin is warm and dry.  Neurological:     Mental Status: He is alert and oriented to person, place, and time.  Psychiatric:        Mood and Affect: Mood normal.       Assessment & Plan:  Upper respiratory tract infection, unspecified type  Wheezing present, but no rhonchi or rales.  Recommend to finish course of prednisone and Augmentin.  Continue using promethazine and albuterol inhaler as needed.  Also encouraged him to continue using his  fluticasone spray for nasal congestion.  Advised patient to contact office if symptoms have not improved by Friday.  If symptoms have not improved, consider adding azithromycin to cover for mycoplasma, or reevaluate and order CXR to rule out pneumonia.  Assured patient that since he received treatment so early, as long as he follows the medication regimen his symptoms are not expected to progress.  Return if symptoms worsen or fail to improve.  Melida Quitter, PA

## 2023-05-13 NOTE — Patient Instructions (Addendum)
Continue all of the medicines that you were prescribed!  They will help to reduce the irritation and inflammation in your lungs, and the antibiotic will treat any bacterial infection that there might be.  Continue the steroid (prednisone), antibiotic (Augmentin), and cough medicine (promethazine) as prescribed by the urgent care.    Be sure to increase your water intake to help your body fight off the sickness.  Use your albuterol inhaler as needed.  If your symptoms worsen by Friday, please get in touch with [place]!

## 2023-05-14 NOTE — Progress Notes (Signed)
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)  Patient's Home Medications on Admission:   Current Outpatient Medications:    albuterol (VENTOLIN HFA) 108 (90 Base) MCG/ACT inhaler, Inhale 2 puffs into the lungs every 6 (six) hours as needed., Disp: 18 g, Rfl: 2   ammonium lactate (LAC-HYDRIN) 12 % lotion, Apply 1 Application topically as needed for dry skin., Disp: , Rfl:    aspirin 81 MG chewable tablet, Chew 81 mg by mouth daily., Disp: , Rfl:    atorvastatin (LIPITOR) 20 MG tablet, Take 20 mg by mouth daily., Disp: , Rfl:    carboxymethylcellulose (REFRESH PLUS) 0.5 % SOLN, INSTILL 1 DROP IN BOTH EYES FOUR TIMES A DAY AS NEEDED, Disp: , Rfl:    cetirizine (ZYRTEC) 10 MG tablet, Take by mouth., Disp: , Rfl:    citalopram (CELEXA) 10 MG tablet, Take 1 tablet (10 mg total) by mouth daily., Disp: 90 tablet, Rfl: 1   cyclobenzaprine (FLEXERIL) 10 MG tablet, Take 1 tablet (10 mg total) by mouth 2 (two) times daily as needed for up to 20 doses for muscle spasms., Disp: 20 tablet, Rfl: 0   diclofenac Sodium (VOLTAREN) 1 % GEL, Apply 2 g topically 4 (four) times daily., Disp: , Rfl:    famotidine (PEPCID) 20 MG tablet, One after supper, Disp: 30 tablet, Rfl: 11   fluticasone (FLONASE) 50 MCG/ACT nasal spray, Place 1-2 sprays into both nostrils daily., Disp: 16 g, Rfl: 2   hydrocortisone 2.5 % cream, APPLY SMALL AMOUNT TO AFFECTED AREA TWICE A DAY AS NEEDED -MIX WITH KETOCONAZOLE  CREAM AND APPLY TO THE RED AREAS UNDER ARMPITS AND  IN THE GROIN TWICE DAILY AS NEEDED -MIX WITH KETOCONAZOLE CREAM AND APPLY TO THE RED AREAS UNDER ARMPITS AND   IN THE GROIN TWICE DAILY AS NEEDED, Disp: , Rfl:    ibuprofen (ADVIL) 600 MG tablet, Take 600 mg by mouth every 6 (six) hours as needed., Disp: , Rfl:    ketoconazole (NIZORAL) 2 % cream, APPLY SMALL AMOUNT TO AFFECTED AREA TWICE A DAY -MIX WITH HYDROCORTISONE CREAM AND APPLY TO THE RED AREAS IN THE GROIN AND  ARMPIT TWICE DAILY AS NEEDED -MIX WITH HYDROCORTISONE CREAM AND APPLY TO THE RED AREAS IN THE GROIN AND  ARMPIT TWICE DAILY AS NEEDED, Disp: , Rfl:    latanoprost (XALATAN) 0.005 % ophthalmic solution, 1 drop at bedtime., Disp: , Rfl:    montelukast (SINGULAIR) 10 MG tablet, Take 1 tablet (10 mg total) by mouth at bedtime., Disp: 30 tablet, Rfl: 5   Multiple Vitamin (MULTIVITAMIN) capsule, Take 1 capsule by mouth daily., Disp: , Rfl:    omeprazole (PRILOSEC) 40 MG capsule, Take 30-60 min before first meal of the day, Disp: 30 capsule, Rfl: 11   terazosin (HYTRIN) 5 MG capsule, Take 5 mg by mouth at bedtime., Disp: , Rfl:    Tiotropium Bromide-Olodaterol (STIOLTO RESPIMAT) 2.5-2.5 MCG/ACT AERS, Inhale 2 puffs into the lungs daily., Disp: , Rfl:  traZODone (DESYREL) 50 MG tablet, Take 1 tablet (50 mg total) by mouth at bedtime as needed for sleep., Disp: 90 tablet, Rfl: 1   triamcinolone cream (KENALOG) 0.5 %, Apply 1 Application topically 3 (three) times daily., Disp: 30 g, Rfl: 2  Past Medical History: Past Medical History:  Diagnosis Date   Adenomatous polyp of colon 09/19/2021   Apr 08, 2016 Entered By: Roosevelt Locks Comment: Adenomatous polyp of the ascending colon (seven fragments) and sigmoid colon at 20cm in December 2001.     Agent orange exposure    COPD (chronic obstructive pulmonary disease) (HCC)    Coronary artery disease    Hyperlipidemia    PTSD (post-traumatic stress disorder)    Sleep apnea     Tobacco  Use: Social History   Tobacco Use  Smoking Status Former   Current packs/day: 0.00   Average packs/day: 1 pack/day for 40.0 years (40.0 ttl pk-yrs)   Types: Cigarettes   Start date: 12/31/1968   Quit date: 12/31/2008   Years since quitting: 14.3  Smokeless Tobacco Never  Tobacco Comments   10 cigs per day    Labs: Review Flowsheet  More data may exist      Latest Ref Rng & Units 12/25/2007 08/14/2012 09/07/2021 10/16/2022 02/21/2023  Labs for ITP Cardiac and Pulmonary Rehab  Cholestrol 100 - 199 mg/dL 161  096  045  409  811   LDL (calc) 0 - 99 mg/dL 914  98  80  81  58   HDL-C >39 mg/dL 78.2  95.62  67  70  74   Trlycerides 0 - 149 mg/dL 52  13.0  78  63  41   Hemoglobin A1c 4.8 - 5.6 % - - 5.4  5.5  -    Capillary Blood Glucose: No results found for: "GLUCAP"   Pulmonary Assessment Scores:  Pulmonary Assessment Scores     Row Name 03/14/23 1050         ADL UCSD   ADL Phase Entry     SOB Score total 64       CAT Score   CAT Score 26       mMRC Score   mMRC Score 2             UCSD: Self-administered rating of dyspnea associated with activities of daily living (ADLs) 6-point scale (0 = "not at all" to 5 = "maximal or unable to do because of breathlessness")  Scoring Scores range from 0 to 120.  Minimally important difference is 5 units  CAT: CAT can identify the health impairment of COPD patients and is better correlated with disease progression.  CAT has a scoring range of zero to 40. The CAT score is classified into four groups of low (less than 10), medium (10 - 20), high (21-30) and very high (31-40) based on the impact level of disease on health status. A CAT score over 10 suggests significant symptoms.  A worsening CAT score could be explained by an exacerbation, poor medication adherence, poor inhaler technique, or progression of COPD or comorbid conditions.  CAT MCID is 2 points  mMRC: mMRC (Modified Medical Research Council) Dyspnea Scale is used to  assess the degree of baseline functional disability in patients of respiratory disease due to dyspnea. No minimal important difference is established. A decrease in score of 1 point or greater is considered a positive change.   Pulmonary Function Assessment:  Pulmonary Function Assessment - 03/14/23 1030  Breath   Bilateral Breath Sounds Clear;Decreased    Shortness of Breath Yes;Limiting activity             Exercise Target Goals: Exercise Program Goal: Individual exercise prescription set using results from initial 6 min walk test and THRR while considering  patient's activity barriers and safety.   Exercise Prescription Goal: Initial exercise prescription builds to 30-45 minutes a day of aerobic activity, 2-3 days per week.  Home exercise guidelines will be given to patient during program as part of exercise prescription that the participant will acknowledge.  Activity Barriers & Risk Stratification:  Activity Barriers & Cardiac Risk Stratification - 03/14/23 1029       Activity Barriers & Cardiac Risk Stratification   Activity Barriers Deconditioning;Muscular Weakness;Shortness of Breath;Arthritis    Cardiac Risk Stratification Moderate             6 Minute Walk:  6 Minute Walk     Row Name 03/14/23 1111         6 Minute Walk   Phase Initial     Distance 1280 feet     Walk Time 6 minutes     # of Rest Breaks 0     MPH 2.42     METS 10.95     RPE 12     Perceived Dyspnea  3     VO2 Peak 6.84     Symptoms No     Resting HR 61 bpm     Resting BP 142/70     Resting Oxygen Saturation  95 %     Exercise Oxygen Saturation  during 6 min walk 89 %     Max Ex. HR 79 bpm     Max Ex. BP 152/68     2 Minute Post BP 128/70       Interval HR   1 Minute HR 63     2 Minute HR 63     3 Minute HR 65     4 Minute HR 63     5 Minute HR 63     6 Minute HR 79     2 Minute Post HR 64     Interval Heart Rate? Yes       Interval Oxygen   Interval Oxygen? Yes      Baseline Oxygen Saturation % 95 %     1 Minute Oxygen Saturation % 93 %     1 Minute Liters of Oxygen 0 L     2 Minute Oxygen Saturation % 89 %     2 Minute Liters of Oxygen 0 L     3 Minute Oxygen Saturation % 90 %     3 Minute Liters of Oxygen 0 L     4 Minute Oxygen Saturation % 90 %     4 Minute Liters of Oxygen 0 L     5 Minute Oxygen Saturation % 91 %     5 Minute Liters of Oxygen 0 L     6 Minute Oxygen Saturation % 90 %     6 Minute Liters of Oxygen 0 L     2 Minute Post Oxygen Saturation % 95 %     2 Minute Post Liters of Oxygen 0 L              Oxygen Initial Assessment:  Oxygen Initial Assessment - 03/14/23 1030       Home Oxygen   Home Oxygen Device None  Sleep Oxygen Prescription CPAP    Home Exercise Oxygen Prescription None    Home Resting Oxygen Prescription None      Initial 6 min Walk   Oxygen Used None      Program Oxygen Prescription   Program Oxygen Prescription None      Intervention   Short Term Goals To learn and understand importance of monitoring SPO2 with pulse oximeter and demonstrate accurate use of the pulse oximeter.;To learn and understand importance of maintaining oxygen saturations>88%;To learn and demonstrate proper use of respiratory medications;To learn and demonstrate proper pursed lip breathing techniques or other breathing techniques.     Long  Term Goals Maintenance of O2 saturations>88%;Compliance with respiratory medication;Verbalizes importance of monitoring SPO2 with pulse oximeter and return demonstration;Exhibits proper breathing techniques, such as pursed lip breathing or other method taught during program session;Demonstrates proper use of MDI's             Oxygen Re-Evaluation:  Oxygen Re-Evaluation     Row Name 03/14/23 1030 04/07/23 0730 05/09/23 1328         Program Oxygen Prescription   Program Oxygen Prescription -- None None       Home Oxygen   Home Oxygen Device -- None None     Sleep Oxygen  Prescription -- CPAP CPAP     Home Exercise Oxygen Prescription -- None None     Home Resting Oxygen Prescription -- None None       Goals/Expected Outcomes   Short Term Goals -- To learn and understand importance of monitoring SPO2 with pulse oximeter and demonstrate accurate use of the pulse oximeter.;To learn and understand importance of maintaining oxygen saturations>88%;To learn and demonstrate proper use of respiratory medications;To learn and demonstrate proper pursed lip breathing techniques or other breathing techniques.  To learn and understand importance of monitoring SPO2 with pulse oximeter and demonstrate accurate use of the pulse oximeter.;To learn and understand importance of maintaining oxygen saturations>88%;To learn and demonstrate proper use of respiratory medications;To learn and demonstrate proper pursed lip breathing techniques or other breathing techniques.      Long  Term Goals -- Maintenance of O2 saturations>88%;Compliance with respiratory medication;Verbalizes importance of monitoring SPO2 with pulse oximeter and return demonstration;Exhibits proper breathing techniques, such as pursed lip breathing or other method taught during program session;Demonstrates proper use of MDI's Maintenance of O2 saturations>88%;Compliance with respiratory medication;Verbalizes importance of monitoring SPO2 with pulse oximeter and return demonstration;Exhibits proper breathing techniques, such as pursed lip breathing or other method taught during program session;Demonstrates proper use of MDI's     Goals/Expected Outcomes Compliance and understanding of oxygen saturation monitoring and breathing techniques to decrease shortness of breath. Compliance and understanding of oxygen saturation monitoring and breathing techniques to decrease shortness of breath. Compliance and understanding of oxygen saturation monitoring and breathing techniques to decrease shortness of breath.              Oxygen  Discharge (Final Oxygen Re-Evaluation):  Oxygen Re-Evaluation - 05/09/23 1328       Program Oxygen Prescription   Program Oxygen Prescription None      Home Oxygen   Home Oxygen Device None    Sleep Oxygen Prescription CPAP    Home Exercise Oxygen Prescription None    Home Resting Oxygen Prescription None      Goals/Expected Outcomes   Short Term Goals To learn and understand importance of monitoring SPO2 with pulse oximeter and demonstrate accurate use of the pulse oximeter.;To learn and understand importance  of maintaining oxygen saturations>88%;To learn and demonstrate proper use of respiratory medications;To learn and demonstrate proper pursed lip breathing techniques or other breathing techniques.     Long  Term Goals Maintenance of O2 saturations>88%;Compliance with respiratory medication;Verbalizes importance of monitoring SPO2 with pulse oximeter and return demonstration;Exhibits proper breathing techniques, such as pursed lip breathing or other method taught during program session;Demonstrates proper use of MDI's    Goals/Expected Outcomes Compliance and understanding of oxygen saturation monitoring and breathing techniques to decrease shortness of breath.             Initial Exercise Prescription:  Initial Exercise Prescription - 03/14/23 1100       Date of Initial Exercise RX and Referring Provider   Date 03/14/23    Referring Provider Vivia Budge   Expected Discharge Date 06/04/22      Treadmill   MPH 1.8    Grade 0    Minutes 15      Recumbant Elliptical   Level 2    RPM 60    Watts 40    Minutes 15      Prescription Details   Frequency (times per week) 2    Duration Progress to 30 minutes of continuous aerobic without signs/symptoms of physical distress      Intensity   THRR 40-80% of Max Heartrate 57-114    Ratings of Perceived Exertion 11-13    Perceived Dyspnea 0-4      Progression   Progression Continue progressive overload as per policy  without signs/symptoms or physical distress.      Resistance Training   Training Prescription Yes    Weight blue bands    Reps 10-15             Perform Capillary Blood Glucose checks as needed.  Exercise Prescription Changes:   Exercise Prescription Changes     Row Name 03/18/23 1100 04/01/23 1100 04/15/23 1100 04/29/23 1400 05/08/23 1209     Response to Exercise   Blood Pressure (Admit) 128/66 122/68 140/60 108/60 110/64   Blood Pressure (Exercise) 126/64 140/70 132/66 182/70 --   Blood Pressure (Exit) 112/66 102/60 120/70 94/60 120/64   Heart Rate (Admit) 71 bpm 60 bpm 74 bpm 73 bpm 64 bpm   Heart Rate (Exercise) 88 bpm 78 bpm 95 bpm 92 bpm 94 bpm   Heart Rate (Exit) 74 bpm 72 bpm 79 bpm 69 bpm 78 bpm   Oxygen Saturation (Admit) 95 % 97 % 96 % 95 % 96 %   Oxygen Saturation (Exercise) 95 % 94 % 93 % 93 % 92 %   Oxygen Saturation (Exit) 95 % 93 % 95 % 96 % 95 %   Rating of Perceived Exertion (Exercise) 13 13 12 13 12    Perceived Dyspnea (Exercise) 3 2 2 2 2    Duration Continue with 30 min of aerobic exercise without signs/symptoms of physical distress. Continue with 30 min of aerobic exercise without signs/symptoms of physical distress. Continue with 30 min of aerobic exercise without signs/symptoms of physical distress. Continue with 30 min of aerobic exercise without signs/symptoms of physical distress. Continue with 30 min of aerobic exercise without signs/symptoms of physical distress.   Intensity THRR unchanged THRR unchanged THRR unchanged THRR unchanged THRR unchanged     Progression   Progression -- Continue to progress workloads to maintain intensity without signs/symptoms of physical distress. Continue to progress workloads to maintain intensity without signs/symptoms of physical distress. Continue to progress workloads to maintain intensity  without signs/symptoms of physical distress. Continue to progress workloads to maintain intensity without signs/symptoms of  physical distress.   Average METs -- 2.99 -- -- --     Resistance Training   Training Prescription Yes Yes Yes Yes Yes   Weight blue bands blue bands black bands black bands black bands   Reps 10-15 10-15 10-15 10-15 10-15   Time 10 Minutes 10 Minutes 10 Minutes 10 Minutes 10 Minutes     Treadmill   MPH 1.8 2.2 2.5 2.5 2.5   Grade 0 1 1 2 2    Minutes 15 15 15 15 15    METs 2.2 2.99 3.26 3.6 3.4     Recumbant Elliptical   Level 2 3 4 8   2-8 5   RPM 46 -- 55 -- 55   Watts -- -- 73 -- 76   Minutes 15 15 15 15 15    METs 1.8 2.2 2.5 2.9 2.6            Exercise Comments:   Exercise Comments     Row Name 03/18/23 1156           Exercise Comments Pt completed his first day of group exercise. He exercised on the treadmill for 15 min, 1.8 mph, 0 incline, METs 2.2. He then exercised on the recumbent elliptical for 15 min, level 2, METs 1.8. Pt tolerated well although admits to fatigue. He performed warm up and cool down without limitations, including squats. Discussed METs with fair comprehension.                Exercise Goals and Review:   Exercise Goals     Row Name 03/14/23 1029             Exercise Goals   Increase Physical Activity Yes       Intervention Provide advice, education, support and counseling about physical activity/exercise needs.;Develop an individualized exercise prescription for aerobic and resistive training based on initial evaluation findings, risk stratification, comorbidities and participant's personal goals.       Expected Outcomes Short Term: Attend rehab on a regular basis to increase amount of physical activity.;Long Term: Add in home exercise to make exercise part of routine and to increase amount of physical activity.;Long Term: Exercising regularly at least 3-5 days a week.       Increase Strength and Stamina Yes       Intervention Provide advice, education, support and counseling about physical activity/exercise needs.;Develop an  individualized exercise prescription for aerobic and resistive training based on initial evaluation findings, risk stratification, comorbidities and participant's personal goals.       Expected Outcomes Short Term: Increase workloads from initial exercise prescription for resistance, speed, and METs.;Short Term: Perform resistance training exercises routinely during rehab and add in resistance training at home;Long Term: Improve cardiorespiratory fitness, muscular endurance and strength as measured by increased METs and functional capacity ( )       Able to understand and use rate of perceived exertion (RPE) scale Yes       Intervention Provide education and explanation on how to use RPE scale       Expected Outcomes Short Term: Able to use RPE daily in rehab to express subjective intensity level;Long Term:  Able to use RPE to guide intensity level when exercising independently       Able to understand and use Dyspnea scale Yes       Intervention Provide education and explanation on how to use Dyspnea scale  Expected Outcomes Short Term: Able to use Dyspnea scale daily in rehab to express subjective sense of shortness of breath during exertion;Long Term: Able to use Dyspnea scale to guide intensity level when exercising independently       Knowledge and understanding of Target Heart Rate Range (THRR) Yes       Intervention Provide education and explanation of THRR including how the numbers were predicted and where they are located for reference       Expected Outcomes Short Term: Able to state/look up THRR;Long Term: Able to use THRR to govern intensity when exercising independently;Short Term: Able to use daily as guideline for intensity in rehab       Understanding of Exercise Prescription Yes       Intervention Provide education, explanation, and written materials on patient's individual exercise prescription       Expected Outcomes Short Term: Able to explain program exercise  prescription;Long Term: Able to explain home exercise prescription to exercise independently                Exercise Goals Re-Evaluation :  Exercise Goals Re-Evaluation     Row Name 03/14/23 1113 04/07/23 0727 05/09/23 1320         Exercise Goal Re-Evaluation   Exercise Goals Review Increase Physical Activity;Able to understand and use Dyspnea scale;Understanding of Exercise Prescription;Increase Strength and Stamina;Knowledge and understanding of Target Heart Rate Range (THRR);Able to understand and use rate of perceived exertion (RPE) scale Increase Physical Activity;Able to understand and use Dyspnea scale;Understanding of Exercise Prescription;Increase Strength and Stamina;Knowledge and understanding of Target Heart Rate Range (THRR);Able to understand and use rate of perceived exertion (RPE) scale Increase Physical Activity;Able to understand and use Dyspnea scale;Understanding of Exercise Prescription;Increase Strength and Stamina;Knowledge and understanding of Target Heart Rate Range (THRR);Able to understand and use rate of perceived exertion (RPE) scale     Comments Tiburcio Pea is scheduled to exercise 11/26. Will continue to monitor and progress as able. Tiburcio Pea has completed 5 exercise sessions. He exercises for 15 min on the treadmill and recumbent elliptical. He averages 2.8 METs at 2.2 mph and 1% on the treadmill and 3.7 METs at level 3 on the recumbent elliptical. He performs the warmup and cooldown standing without limitations. Tiburcio Pea has increased his workloads for both exercise modes as METs have significantly increased. Harris tolerates progressions well. We have not discussed home exercise yet as Tiburcio Pea is the next patient I am planning to talk to. Will conitnue to monitor and progress as able. Tiburcio Pea has completed 15 exercise sessions. He exercises for 15 min on the treadmill and recumbent elliptical. He averages 3.4 METs at 2.5 mph and 2% on the treadmill and 2.6 METs at level 5 on  the recumbent elliptical. He performs the warmup and cooldown standing without limitations. Tiburcio Pea has increased his speed and incline on the tredmill as METs have significantly increased. He has increased his level on the recumbent elliptical several times although METs have remained relatively the same. I have recently briefly discussed home exercise. I plan to finish this conversation next session. Will continue to monitor and progress as able.     Expected Outcomes Through exercise at rehab and home, the patient will decrease shortness of breath with daily activities and feel confident in carrying out an exercise regimen at home. Through exercise at rehab and home, the patient will decrease shortness of breath with daily activities and feel confident in carrying out an exercise regimen at home. Through exercise at  rehab and home, the patient will decrease shortness of breath with daily activities and feel confident in carrying out an exercise regimen at home.              Discharge Exercise Prescription (Final Exercise Prescription Changes):  Exercise Prescription Changes - 05/08/23 1209       Response to Exercise   Blood Pressure (Admit) 110/64    Blood Pressure (Exit) 120/64    Heart Rate (Admit) 64 bpm    Heart Rate (Exercise) 94 bpm    Heart Rate (Exit) 78 bpm    Oxygen Saturation (Admit) 96 %    Oxygen Saturation (Exercise) 92 %    Oxygen Saturation (Exit) 95 %    Rating of Perceived Exertion (Exercise) 12    Perceived Dyspnea (Exercise) 2    Duration Continue with 30 min of aerobic exercise without signs/symptoms of physical distress.    Intensity THRR unchanged      Progression   Progression Continue to progress workloads to maintain intensity without signs/symptoms of physical distress.      Resistance Training   Training Prescription Yes    Weight black bands    Reps 10-15    Time 10 Minutes      Treadmill   MPH 2.5    Grade 2    Minutes 15    METs 3.4       Recumbant Elliptical   Level 5    RPM 55    Watts 76    Minutes 15    METs 2.6             Nutrition:  Target Goals: Understanding of nutrition guidelines, daily intake of sodium 1500mg , cholesterol 200mg , calories 30% from fat and 7% or less from saturated fats, daily to have 5 or more servings of fruits and vegetables.  Biometrics:  Pre Biometrics - 03/14/23 1138       Pre Biometrics   Grip Strength 32 kg              Nutrition Therapy Plan and Nutrition Goals:   Nutrition Assessments:  MEDIFICTS Score Key: >=70 Need to make dietary changes  40-70 Heart Healthy Diet <= 40 Therapeutic Level Cholesterol Diet   Picture Your Plate Scores: <21 Unhealthy dietary pattern with much room for improvement. 41-50 Dietary pattern unlikely to meet recommendations for good health and room for improvement. 51-60 More healthful dietary pattern, with some room for improvement.  >60 Healthy dietary pattern, although there may be some specific behaviors that could be improved.    Nutrition Goals Re-Evaluation:   Nutrition Goals Discharge (Final Nutrition Goals Re-Evaluation):   Psychosocial: Target Goals: Acknowledge presence or absence of significant depression and/or stress, maximize coping skills, provide positive support system. Participant is able to verbalize types and ability to use techniques and skills needed for reducing stress and depression.  Initial Review & Psychosocial Screening:  Initial Psych Review & Screening - 03/14/23 1025       Initial Review   Current issues with Current Depression;Current Psychotropic Meds;Current Stress Concerns    Source of Stress Concerns Family    Comments Pt is dealing with grief from recently losing one of his daughters. He is also dealing with family stress that has to do with his other daughter.      Family Dynamics   Good Support System? Yes    Concerns Recent loss of child      Barriers   Psychosocial barriers  to participate in program  The patient should benefit from training in stress management and relaxation.;Psychosocial barriers identified (see note)      Screening Interventions   Interventions Encouraged to exercise;To provide support and resources with identified psychosocial needs    Expected Outcomes Short Term goal: Utilizing psychosocial counselor, staff and physician to assist with identification of specific Stressors or current issues interfering with healing process. Setting desired goal for each stressor or current issue identified.;Long Term Goal: Stressors or current issues are controlled or eliminated.;Short Term goal: Identification and review with participant of any Quality of Life or Depression concerns found by scoring the questionnaire.;Long Term goal: The participant improves quality of Life and PHQ9 Scores as seen by post scores and/or verbalization of changes             Quality of Life Scores:  Scores of 19 and below usually indicate a poorer quality of life in these areas.  A difference of  2-3 points is a clinically meaningful difference.  A difference of 2-3 points in the total score of the Quality of Life Index has been associated with significant improvement in overall quality of life, self-image, physical symptoms, and general health in studies assessing change in quality of life.  PHQ-9: Review Flowsheet  More data exists      04/29/2023 03/14/2023 12/10/2022 10/15/2022 07/15/2022  Depression screen PHQ 2/9  Decreased Interest 1 0 0 0 0 0  Down, Depressed, Hopeless 0 1 0 0 0 0  PHQ - 2 Score 1 1 0 0 0 0  Altered sleeping 0 1 0 0 0 0  Tired, decreased energy 0 1 0 0 3 0  Change in appetite 0 1 0 0 1 0  Feeling bad or failure about yourself  0 0 0 0 0 0  Trouble concentrating 0 0 0 0 0 0  Moving slowly or fidgety/restless 0 0 0 0 0 0  Suicidal thoughts 0 0 0 0 0 0  PHQ-9 Score 1 4 0 0 4 0  Difficult doing work/chores Not difficult at all Not difficult at all Not  difficult at all Not difficult at all Not difficult at all -    Details       Multiple values from one day are sorted in reverse-chronological order        Interpretation of Total Score  Total Score Depression Severity:  1-4 = Minimal depression, 5-9 = Mild depression, 10-14 = Moderate depression, 15-19 = Moderately severe depression, 20-27 = Severe depression   Psychosocial Evaluation and Intervention:  Psychosocial Evaluation - 03/14/23 1026       Psychosocial Evaluation & Interventions   Interventions Encouraged to exercise with the program and follow exercise prescription;Stress management education    Comments Pt is dealing with grief from recently losing one of his daughters. He is also dealing with family stress that has to do with his other daughter. He is currently on psychotropic meds and is compliant with taking them. He also see's a counseler for his PTSD.    Expected Outcomes For Iver's depression and stress to be decreased during the PR program    Continue Psychosocial Services  Follow up required by staff             Psychosocial Re-Evaluation:  Psychosocial Re-Evaluation     Row Name 03/17/23 0943 04/07/23 1316 05/05/23 0910         Psychosocial Re-Evaluation   Current issues with Current Depression;Current Psychotropic Meds;Current Stress Concerns Current Depression;Current Psychotropic Meds;Current Stress Concerns  Current Psychotropic Meds;Current Stress Concerns;Current Depression     Comments Clennon is scheduled to start PR on 03/18/23. No new psychosocial barriers or concerns since orientation on 03/14/23. Wilmot feels that his depression is stable at this time. He has a little stress due to the holiday's, but is handling it well. He is compliant with taking his psychotropic meds and meeting with his therapist. No new barriers or concerns at this time. Andru continues to feel that his depression is stable at this time. He states that coming to  class and exercising has helped his mental health. He is always smiling and laughing in class. He is still compliant with taking his psychotropic meds and meeting with his therapist.  No new psychosocial barriers or concerns at this time.     Expected Outcomes For Deuntae to participate in PR free of any psychosocial barriers or concerns For Dominion to participate in PR free of any psychosocial barriers or concerns For Arihaan to participate in PR free of any psychosocial barriers or concerns     Interventions Encouraged to attend Pulmonary Rehabilitation for the exercise Encouraged to attend Pulmonary Rehabilitation for the exercise Encouraged to attend Pulmonary Rehabilitation for the exercise     Continue Psychosocial Services  Follow up required by staff No Follow up required No Follow up required              Psychosocial Discharge (Final Psychosocial Re-Evaluation):  Psychosocial Re-Evaluation - 05/05/23 0910       Psychosocial Re-Evaluation   Current issues with Current Psychotropic Meds;Current Stress Concerns;Current Depression    Comments Balthazar continues to feel that his depression is stable at this time. He states that coming to class and exercising has helped his mental health. He is always smiling and laughing in class. He is still compliant with taking his psychotropic meds and meeting with his therapist.  No new psychosocial barriers or concerns at this time.    Expected Outcomes For Kenyen to participate in PR free of any psychosocial barriers or concerns    Interventions Encouraged to attend Pulmonary Rehabilitation for the exercise    Continue Psychosocial Services  No Follow up required             Education: Education Goals: Education classes will be provided on a weekly basis, covering required topics. Participant will state understanding/return demonstration of topics presented.  Learning Barriers/Preferences:  Learning Barriers/Preferences - 03/14/23  1026       Learning Barriers/Preferences   Learning Barriers Sight;Hearing   wears glasses and hearing aids   Learning Preferences Computer/Internet;Group Instruction;Individual Instruction;Skilled Demonstration;Written Material             Education Topics: Know Your Numbers Group instruction that is supported by a PowerPoint presentation. Instructor discusses importance of knowing and understanding resting, exercise, and post-exercise oxygen saturation, heart rate, and blood pressure. Oxygen saturation, heart rate, blood pressure, rating of perceived exertion, and dyspnea are reviewed along with a normal range for these values.  Flowsheet Row PULMONARY REHAB CHRONIC OBSTRUCTIVE PULMONARY DISEASE from 04/24/2023 in [facility] for Heart, Vascular, & Lung Health  Date 04/24/23  Educator EP  Instruction Review Code 1- Verbalizes Understanding       Exercise for the Pulmonary Patient Group instruction that is supported by a PowerPoint presentation. Instructor discusses benefits of exercise, core components of exercise, frequency, duration, and intensity of an exercise routine, importance of utilizing pulse oximetry during exercise, safety while exercising, and options of places to  exercise outside of rehab.  Flowsheet Row PULMONARY REHAB CHRONIC OBSTRUCTIVE PULMONARY DISEASE from 04/17/2023 in [facility] for Heart, Vascular, & Lung Health  Date 04/17/23  Educator EP  Instruction Review Code 1- Verbalizes Understanding       MET Level  Group instruction provided by PowerPoint, verbal discussion, and written material to support subject matter. Instructor reviews what METs are and how to increase METs.    Pulmonary Medications Verbally interactive group education provided by instructor with focus on inhaled medications and proper administration. Flowsheet Row PULMONARY REHAB CHRONIC OBSTRUCTIVE PULMONARY DISEASE from 04/10/2023 in  [facility] for Heart, Vascular, & Lung Health  Date 04/10/23  Educator RT  Instruction Review Code 1- Verbalizes Understanding       Anatomy and Physiology of the Respiratory System Group instruction provided by PowerPoint, verbal discussion, and written material to support subject matter. Instructor reviews respiratory cycle and anatomical components of the respiratory system and their functions. Instructor also reviews differences in obstructive and restrictive respiratory diseases with examples of each.  Flowsheet Row PULMONARY REHAB CHRONIC OBSTRUCTIVE PULMONARY DISEASE from 04/03/2023 in [facility] for Heart, Vascular, & Lung Health  Date 04/03/23  Educator RT  Instruction Review Code 1- Verbalizes Understanding       Oxygen Safety Group instruction provided by PowerPoint, verbal discussion, and written material to support subject matter. There is an overview of "What is Oxygen" and "Why do we need it".  Instructor also reviews how to create a safe environment for oxygen use, the importance of using oxygen as prescribed, and the risks of noncompliance. There is a brief discussion on traveling with oxygen and resources the patient may utilize. Flowsheet Row PULMONARY REHAB CHRONIC OBSTRUCTIVE PULMONARY DISEASE from 05/01/2023 in [facility] for Heart, Vascular, & Lung Health  Date 05/01/23  Educator RN  Instruction Review Code 1- Verbalizes Understanding       Oxygen Use Group instruction provided by PowerPoint, verbal discussion, and written material to discuss how supplemental oxygen is prescribed and different types of oxygen supply systems. Resources for more information are provided.  Flowsheet Row PULMONARY REHAB CHRONIC OBSTRUCTIVE PULMONARY DISEASE from 05/08/2023 in [facility] for Heart, Vascular, & Lung Health  Date 05/08/23  Educator RT  Instruction Review Code 1- Verbalizes  Understanding       Breathing Techniques Group instruction that is supported by demonstration and informational handouts. Instructor discusses the benefits of pursed lip and diaphragmatic breathing and detailed demonstration on how to perform both.     Risk Factor Reduction Group instruction that is supported by a PowerPoint presentation. Instructor discusses the definition of a risk factor, different risk factors for pulmonary disease, and how the heart and lungs work together. Flowsheet Row PULMONARY REHAB OTHER RESPIRATORY from 09/12/2016 in [facility] for Heart, Vascular, & Lung Health  Date 07/11/16  Educator EP  Instruction Review Code (Retired) 2- meets goals/outcomes       Pulmonary Diseases Group instruction provided by PowerPoint, verbal discussion, and written material to support subject matter. Instructor gives an overview of the different type of pulmonary diseases. There is also a discussion on risk factors and symptoms as well as ways to manage the diseases. Flowsheet Row PULMONARY REHAB CHRONIC OBSTRUCTIVE PULMONARY DISEASE from 03/27/2023 in [facility] for Heart, Vascular, & Lung Health  Date 03/27/23  Educator RT  Instruction Review Code 1- Verbalizes Understanding  Stress and Energy Conservation Group instruction provided by PowerPoint, verbal discussion, and written material to support subject matter. Instructor gives an overview of stress and the impact it can have on the body. Instructor also reviews ways to reduce stress. There is also a discussion on energy conservation and ways to conserve energy throughout the day.   Warning Signs and Symptoms Group instruction provided by PowerPoint, verbal discussion, and written material to support subject matter. Instructor reviews warning signs and symptoms of stroke, heart attack, cold and flu. Instructor also reviews ways to prevent the spread of  infection.   Other Education Group or individual verbal, written, or video instructions that support the educational goals of the pulmonary rehab program.    Knowledge Questionnaire Score:  Knowledge Questionnaire Score - 03/14/23 1051       Knowledge Questionnaire Score   Pre Score 16/18             Core Components/Risk Factors/Patient Goals at Admission:  Personal Goals and Risk Factors at Admission - 03/14/23 1027       Core Components/Risk Factors/Patient Goals on Admission   Improve shortness of breath with ADL's Yes    Intervention Provide education, individualized exercise plan and daily activity instruction to help decrease symptoms of SOB with activities of daily living.    Expected Outcomes Short Term: Improve cardiorespiratory fitness to achieve a reduction of symptoms when performing ADLs;Long Term: Be able to perform more ADLs without symptoms or delay the onset of symptoms    Increase knowledge of respiratory medications and ability to use respiratory devices properly  Yes    Intervention Provide education and demonstration as needed of appropriate use of medications, inhalers, and oxygen therapy.    Expected Outcomes Short Term: Achieves understanding of medications use. Understands that oxygen is a medication prescribed by physician. Demonstrates appropriate use of inhaler and oxygen therapy.;Long Term: Maintain appropriate use of medications, inhalers, and oxygen therapy.             Core Components/Risk Factors/Patient Goals Review:   Goals and Risk Factor Review     Row Name 03/17/23 0947 04/07/23 1318 05/05/23 0913         Core Components/Risk Factors/Patient Goals Review   Personal Goals Review Improve shortness of breath with ADL's;Develop more efficient breathing techniques such as purse lipped breathing and diaphragmatic breathing and practicing self-pacing with activity.;Increase knowledge of respiratory medications and ability to use respiratory  devices properly. Improve shortness of breath with ADL's;Develop more efficient breathing techniques such as purse lipped breathing and diaphragmatic breathing and practicing self-pacing with activity.;Increase knowledge of respiratory medications and ability to use respiratory devices properly. Improve shortness of breath with ADL's;Develop more efficient breathing techniques such as purse lipped breathing and diaphragmatic breathing and practicing self-pacing with activity.     Review Kyle is scheduled to start PR on 03/18/23. Goal progressing for improve shortness of breath with ADL's. Goal progressing for developing more efficient breathing techniques such as purse lipped breathing and diaphragmatic breathing; and practicing self-pacing with activity. Goal progressing for increasing knowledge of respiratory medications and ability to use respiratory devices properly. Goal progressing for improve shortness of breath with ADL's. Jonmichael remains on RA while exercising with sats >88%. Goal progressing for developing more efficient breathing techniques such as purse lipped breathing and diaphragmatic breathing; and practicing self-pacing with activity. Goal met for increasing knowledge of respiratory medications and ability to use respiratory devices properly. Kelli is able to demonstrate proper technique with MDI to  staff. We will continue to monitor Broughton's progress throughout the program. Goal progressing for improve shortness of breath with ADL's. Omarri remains on RA while exercising with sats >88%. Goal met for developing more efficient breathing techniques such as purse lipped breathing and diaphragmatic breathing; and practicing self-pacing with activity. Zavior demonstrates purse lip breathing when he gets short of breath. He also knows how to pace himself based on the RPE scale. We will continue to monitor Lance's progress throughout the program.     Expected Outcomes For Nix to  improve shortness of breath with ADL's, develop more efficient breathing techniques such as purse lipped breathing and diaphragmatic breathing; and practicing self-pacing with activity and increase knowledge of respiratory medications and ability to use respiratory devices properly. For Romeo Apple to improve shortness of breath with ADL's and develop more efficient breathing techniques such as purse lipped breathing and diaphragmatic breathing; and practicing self-pacing with activity. For Romeo Apple to improve shortness of breath with ADL's              Core Components/Risk Factors/Patient Goals at Discharge (Final Review):   Goals and Risk Factor Review - 05/05/23 0913       Core Components/Risk Factors/Patient Goals Review   Personal Goals Review Improve shortness of breath with ADL's;Develop more efficient breathing techniques such as purse lipped breathing and diaphragmatic breathing and practicing self-pacing with activity.    Review Goal progressing for improve shortness of breath with ADL's. Davyd remains on RA while exercising with sats >88%. Goal met for developing more efficient breathing techniques such as purse lipped breathing and diaphragmatic breathing; and practicing self-pacing with activity. Ralf demonstrates purse lip breathing when he gets short of breath. He also knows how to pace himself based on the RPE scale. We will continue to monitor Leelynd's progress throughout the program.    Expected Outcomes For Seyon to improve shortness of breath with ADL's             ITP Comments: Pt is making expected progress toward Pulmonary Rehab goals after completing 15 session(s). Recommend continued exercise, life style modification, education, and utilization of breathing techniques to increase stamina and strength, while also decreasing shortness of breath with exertion.  Dr. Mechele Collin is Medical Director for Pulmonary Rehab at [facility].

## 2023-05-15 ENCOUNTER — Encounter (HOSPITAL_COMMUNITY): Payer: No Typology Code available for payment source

## 2023-05-16 ENCOUNTER — Ambulatory Visit: Payer: Self-pay | Admitting: Family Medicine

## 2023-05-16 ENCOUNTER — Telehealth: Payer: Self-pay | Admitting: Family Medicine

## 2023-05-16 MED ORDER — AZITHROMYCIN 250 MG PO TABS: ORAL_TABLET | ORAL | 0 refills | Status: AC

## 2023-05-16 NOTE — Telephone Encounter (Signed)
Routing this to PCP and Lequita Halt since she saw pt last, I called pt and informed him that something else had been sent in. He called back since he had not heard anything.

## 2023-05-16 NOTE — Telephone Encounter (Signed)
Called the patient.  He had picked up the medication Lequita Halt had sent in.  I made sure he had albuterol if he needs it.  Advised him that we would not be able to see any messages till Monday at the earliest so that if he gets worse he should go to an urgent care or emergency department to be evaluated.

## 2023-05-16 NOTE — Telephone Encounter (Signed)
Azithromycin has been sent to [facility].  He should add this to his current regimen but make sure that he completely finishes the prescriptions that were sent by urgent care earlier this week as well.  Adding azithromycin to cover for mycoplasma given increased risk.

## 2023-05-16 NOTE — Telephone Encounter (Signed)
Chief Complaint: Difficulty breathing Symptoms: Wheezing, SOB, runny nose, productive cough. Frequency: Constant Pertinent Negatives: Patient denies Fever, chest pain. Disposition: [] ED /[] Urgent Care (no appt availability in office) / [x] Appointment(In office/virtual)/ []  [place] [facility]/ [] Home Care/ [x] Refused Recommended Disposition /[] [place] [facility]/ []  Follow-up with PCP Additional Notes: Patient called for complaints of moderate SOB, worsened with exertion, mild wheezing, congestion, and productive cough. Patient states that he was seen at an UC for the above symptoms and was prescribed steroids and a regimen of abx, and was given instruction to call back if symptoms were not improved by today and he would be prescribed something different. Patient states that he has SOB with walking and has trouble catching breath when active. "I sometimes have to go outside in the cold just to get my breath back." Patient spoke in complete sentences and did not appear in distress at time of call. Patient admits to having COPD and is on a maintenance inhaler.  Patient denies fever and pain. Patients states SOB is constant but better at rest. Patient states sputum is yellow and sometimes coughs so severely he loses his breath "and it feels like Im going to pass out." Patient advised by this RN per protocol to be seen in office or UC withi\in the next 4 hours, to which patient refused, stating that he was only calling to get prescribed something different to "prevent anything from going to my lungs." Patient refused VV. Patient was advised by this RN note will be routed to office and to call back or call 911 with any worsening symptoms. Patient verbalized understanding.   Reason for Disposition  [1] Longstanding difficulty breathing (e.g., CHF, COPD, emphysema) AND [2] WORSE than normal  Answer Assessment - Initial Assessment Questions 1. RESPIRATORY STATUS: "Describe your breathing?" (e.g.,  wheezing, shortness of breath, unable to speak, severe coughing)      Wheezing, SOB at activity, severe coughing "Sometimes I cough so much that it feels like I'm going to pass out" 2. ONSET: "When did this breathing problem begin?"      One to two weeks 3. PATTERN "Does the difficult breathing come and go, or has it been constant since it started?"  Constant "It get progressively worse when I start walking or moving" 4. SEVERITY: "How bad is your breathing?" (e.g., mild, moderate, severe)    - MILD: No SOB at rest, mild SOB with walking, speaks normally in sentences, can lie down, no retractions, pulse < 100.    - MODERATE: SOB at rest, SOB with minimal exertion and prefers to sit, cannot lie down flat, speaks in phrases, mild retractions, audible wheezing, pulse 100-120.    - SEVERE: Very SOB at rest, speaks in single words, struggling to breathe, sitting hunched forward, retractions, pulse > 120      Moderate 5. RECURRENT SYMPTOM: "Have you had difficulty breathing before?" If Yes, ask: "When was the last time?" and "What happened that time?"      COPD 6. CARDIAC HISTORY: "Do you have any history of heart disease?" (e.g., heart attack, angina, bypass surgery, angioplasty)      Denies 7. LUNG HISTORY: "Do you have any history of lung disease?"  (e.g., pulmonary embolus, asthma, emphysema)     COPD 8. CAUSE: "What do you think is causing the breathing problem?"      "I have been congested for about two weeks and the stuff they gave me is not helping I need something else." 9. OTHER SYMPTOMS: "Do you  have any other symptoms? (e.g., dizziness, runny nose, cough, chest pain, fever)     Coughing productive (yellow), runny nose.  12. TRAVEL: "Have you traveled out of the country in the last month?" (e.g., travel history, exposures)       Denies  Protocols used: Breathing Difficulty-A-AH

## 2023-05-16 NOTE — Telephone Encounter (Signed)
Copied from CRM [id number]. Topic: Clinical - Medication Question >> May 16, 2023  8:26 AM Prudencio Pair wrote: Reason for CRM: Patient states he has not gotten better after the visit earlier this week with Dr. Jairo Ben. He wants to know if she can prescribe him something stronger than what he has already. If so, he would like for it to go over to [facility], next door. Please give patient a call back to advise if something else will be prescribed or not. CB #: [id number].

## 2023-05-19 NOTE — Telephone Encounter (Signed)
Called pt he is advised of his Rx that was sent and he is advised of his recommendation

## 2023-05-20 ENCOUNTER — Encounter (HOSPITAL_COMMUNITY)
Admission: RE | Admit: 2023-05-20 | Discharge: 2023-05-20 | Disposition: A | Discharge status: Home / Self Care | Payer: No Typology Code available for payment source | Source: Ambulatory Visit | Attending: Pulmonary Disease | Admitting: Pulmonary Disease

## 2023-05-20 DIAGNOSIS — J449 Chronic obstructive pulmonary disease, unspecified: Secondary | ICD-10-CM | POA: Diagnosis not present

## 2023-05-20 NOTE — Progress Notes (Signed)
Daily Session Note  Patient Details  Name: Arthur Harper MRN: [id number] Date of Birth: 26-May-1945 Referring Provider:   Doristine Devoid Pulmonary Rehab Walk Test from 03/14/2023 in [facility] for [facility], Vascular, & [facility]  Referring Provider [name]  [[name]]       Encounter Date: 05/20/2023  Check In:  Session Check In - 05/20/23 1456       Check-In   Supervising physician immediately available to respond to emergencies [facility] MD immediately available    Physician(s) Tereso Newcomer, PA    [facility]    Staff Present Essie Hart, RN, BSN;Casey Smith, Zella Richer, MS, ACSM-CEP, Exercise Physiologist;David Manus Gunning, MS, ACSM-CEP, CCRP, Exercise Physiologist;Olinty Peggye Pitt, MS, ACSM-CEP, Exercise Physiologist    Virtual Visit No    Medication changes reported     No    Fall or balance concerns reported    No    Tobacco Cessation No Change    Warm-up and Cool-down Performed as group-led instruction    Resistance Training Performed Yes    VAD Patient? No    PAD/SET Patient? No      Pain Assessment   Currently in Pain? No/denies    Pain Score 0-No pain    Multiple Pain Sites No             Capillary Blood Glucose: No results found for this or any previous visit (from the past 24 hours).    Social History   Tobacco Use  Smoking Status Former   Current packs/day: 0.00   Average packs/day: 1 pack/day for 40.0 years (40.0 ttl pk-yrs)   Types: Cigarettes   Start date: 12/31/1968   Quit date: 12/31/2008   Years since quitting: 14.3  Smokeless Tobacco Never  Tobacco Comments   10 cigs per day    Goals Met:  Independence with exercise equipment Exercise tolerated well No report of concerns or symptoms today Strength training completed today  Goals Unmet:  Not Applicable  Comments: Service time is from 1316 to 1440    Dr. Mechele Collin is Medical Director for Pulmonary Rehab at [facility].

## 2023-05-21 ENCOUNTER — Ambulatory Visit
Admission: RE | Admit: 2023-05-21 | Discharge: 2023-05-21 | Disposition: A | Discharge status: Home / Self Care | Payer: Medicare HMO | Source: Ambulatory Visit | Attending: Family Medicine | Admitting: Family Medicine

## 2023-05-21 DIAGNOSIS — R918 Other nonspecific abnormal finding of lung field: Secondary | ICD-10-CM | POA: Diagnosis not present

## 2023-05-21 DIAGNOSIS — Z9189 Other specified personal risk factors, not elsewhere classified: Secondary | ICD-10-CM

## 2023-05-22 ENCOUNTER — Encounter (HOSPITAL_COMMUNITY)
Admission: RE | Admit: 2023-05-22 | Discharge: 2023-05-22 | Disposition: A | Discharge status: Home / Self Care | Payer: No Typology Code available for payment source | Source: Ambulatory Visit | Attending: Pulmonary Disease | Admitting: Pulmonary Disease

## 2023-05-22 DIAGNOSIS — J449 Chronic obstructive pulmonary disease, unspecified: Secondary | ICD-10-CM

## 2023-05-22 NOTE — Progress Notes (Signed)
Daily Session Note  Patient Details  Name: Arthur Harper MRN: [id number] Date of Birth: July 20, 1945 Referring Provider:   Doristine Devoid Pulmonary Rehab Walk Test from 03/14/2023 in [facility] for Heart, Vascular, & Lung Health  Referring Provider [name]  [[name]]       Encounter Date: 05/22/2023  Check In:  Session Check In - 05/22/23 1318       Check-In   Supervising physician immediately available to respond to emergencies [facility] immediately available    Physician(s) Edd Fabian, NP    [facility]    Staff Present Essie Hart, RN, BSN;Casey Katrinka Blazing, Zella Richer, MS, ACSM-CEP, Exercise Physiologist;Randi Idelle Crouch BS, ACSM-CEP, Exercise Physiologist    Virtual Visit No    Medication changes reported     No    Fall or balance concerns reported    No    Tobacco Cessation No Change    Warm-up and Cool-down Performed as group-led instruction    Resistance Training Performed Yes    VAD Patient? No    PAD/SET Patient? No      Pain Assessment   Currently in Pain? No/denies    Multiple Pain Sites No             Capillary Blood Glucose: No results found for this or any previous visit (from the past 24 hours).    Social History   Tobacco Use  Smoking Status Former   Current packs/day: 0.00   Average packs/day: 1 pack/day for 40.0 years (40.0 ttl pk-yrs)   Types: Cigarettes   Start date: 12/31/1968   Quit date: 12/31/2008   Years since quitting: 14.3  Smokeless Tobacco Never  Tobacco Comments   10 cigs per day    Goals Met:  Independence with exercise equipment Exercise tolerated well No report of concerns or symptoms today Strength training completed today  Goals Unmet:  Not Applicable  Comments: Service time is from 1309 to 1436    Dr. Mechele Collin is Medical Director for Pulmonary Rehab at [facility].

## 2023-05-27 ENCOUNTER — Encounter (HOSPITAL_COMMUNITY)
Admission: RE | Admit: 2023-05-27 | Discharge: 2023-05-27 | Disposition: A | Discharge status: Home / Self Care | Payer: No Typology Code available for payment source | Source: Ambulatory Visit | Attending: Pulmonary Disease | Admitting: Pulmonary Disease

## 2023-05-27 VITALS — Wt 240.3 lb

## 2023-05-27 DIAGNOSIS — J449 Chronic obstructive pulmonary disease, unspecified: Secondary | ICD-10-CM | POA: Insufficient documentation

## 2023-05-27 NOTE — Progress Notes (Signed)
Daily Session Note  Patient Details  Name: Arthur Harper MRN: [id number] Date of Birth: 03-Oct-1945 Referring Provider:   Doristine Devoid Pulmonary Rehab Walk Test from 03/14/2023 in [facility] for Heart, Vascular, & Lung Health  Referring Provider [name]  [[name]]       Encounter Date: 05/27/2023  Check In:  Session Check In - 05/27/23 1028       Check-In   Supervising physician immediately available to respond to emergencies [facility] immediately available    Physician(s) Edd Fabian, NP    [facility]    Staff Present Essie Hart, RN, BSN;Casey Katrinka Blazing, Zella Richer, MS, ACSM-CEP, Exercise Physiologist;Tonny Isensee Idelle Crouch BS, ACSM-CEP, Exercise Physiologist    Virtual Visit No    Medication changes reported     No    Fall or balance concerns reported    No    Tobacco Cessation No Change    Warm-up and Cool-down Performed as group-led instruction    Resistance Training Performed Yes    VAD Patient? No    PAD/SET Patient? No      Pain Assessment   Currently in Pain? No/denies    Multiple Pain Sites No             Capillary Blood Glucose: No results found for this or any previous visit (from the past 24 hours).   Exercise Prescription Changes - 05/27/23 1200       Response to Exercise   Blood Pressure (Admit) 112/66    Blood Pressure (Exercise) 144/74    Blood Pressure (Exit) 112/60    Heart Rate (Admit) 62 bpm    Heart Rate (Exercise) 89 bpm    Heart Rate (Exit) 66 bpm    Oxygen Saturation (Admit) 96 %    Oxygen Saturation (Exercise) 94 %    Oxygen Saturation (Exit) 96 %    Rating of Perceived Exertion (Exercise) 13    Perceived Dyspnea (Exercise) 2    Duration Continue with 30 min of aerobic exercise without signs/symptoms of physical distress.    Intensity THRR unchanged      Progression   Progression Continue to progress workloads to maintain intensity without signs/symptoms of physical distress.       Resistance Training   Training Prescription Yes    Weight black bands    Reps 10-15    Time 10 Minutes      Treadmill   MPH 2.5    Grade 2    Minutes 15    METs 3.4      Recumbant Elliptical   Level 5    RPM 57    Watts 83    Minutes 15    METs 2.8             Social History   Tobacco Use  Smoking Status Former   Current packs/day: 0.00   Average packs/day: 1 pack/day for 40.0 years (40.0 ttl pk-yrs)   Types: Cigarettes   Start date: 12/31/1968   Quit date: 12/31/2008   Years since quitting: 14.4  Smokeless Tobacco Never  Tobacco Comments   10 cigs per day    Goals Met:  Independence with exercise equipment Exercise tolerated well No report of concerns or symptoms today Strength training completed today  Goals Unmet:  Not Applicable  Comments: Service time is from 1017 to 1135.    Dr. Mechele Collin is Medical Director for Pulmonary Rehab at [facility].

## 2023-05-29 ENCOUNTER — Encounter (HOSPITAL_COMMUNITY)
Admission: RE | Admit: 2023-05-29 | Discharge: 2023-05-29 | Disposition: A | Discharge status: Home / Self Care | Payer: No Typology Code available for payment source | Source: Ambulatory Visit | Attending: Pulmonary Disease | Admitting: Pulmonary Disease

## 2023-05-29 DIAGNOSIS — J449 Chronic obstructive pulmonary disease, unspecified: Secondary | ICD-10-CM

## 2023-05-29 NOTE — Progress Notes (Signed)
 Home Exercise Prescription I have reviewed a Home Exercise Prescription with Margrette CHRISTELLA Pyo. Zeplin is not curently exercising at home. I encouraged him to start exercising outside of rehab. Shunsuke has Silver Sneakers and can use this program at [employer]. He told me he would visit the [facility] soon. I recommended walking on the treadmill, cycling, or doing anything similar to what he does in rehab for 1 non-rehab day/wk for 30 min/day. Yonael agreed with my recommendations. I am unsure how likely he is to exercise outside of rehab. The patient stated that their goals were to lose weight and decrease shortness of breath. We reviewed exercise guidelines, target heart rate during exercise, RPE Scale, weather conditions, endpoints for exercise, warmup and cool down. The patient is encouraged to come to me with any questions. I will continue to follow up with the patient to assist them with progression and safety. Spent 15 min with patient discussing home exercise plan and goals  Johnnie JINNY Moats, MS, ACSM-CEP 05/29/2023 3:42 PM

## 2023-05-29 NOTE — Progress Notes (Signed)
 Daily Session Note  Patient Details  Name: Arthur Harper MRN: [id number] Date of Birth: [birth year] Referring Provider:   Conrad Ports Pulmonary Rehab Walk Test from 03/14/2023 in [facility] for Heart, Vascular, & Lung Health  Referring Provider [name]  [[name]]       Encounter Date: 05/29/2023  Check In:  Session Check In - 05/29/23 1025       Check-In   Supervising physician immediately available to respond to emergencies [facility] immediately available    Physician(s) Rosabel Mose, NP    [facility]    Staff Present Ronal Levin, RN, BSN;Valaree Fresquez Claudene, Neita Moats, MS, ACSM-CEP, Exercise Physiologist;Randi Midge BS, ACSM-CEP, Exercise Physiologist    Virtual Visit No    Medication changes reported     No    Fall or balance concerns reported    No    Tobacco Cessation No Change    Warm-up and Cool-down Performed as group-led instruction    Resistance Training Performed Yes    VAD Patient? No    PAD/SET Patient? No      Pain Assessment   Currently in Pain? No/denies    Multiple Pain Sites No             Capillary Blood Glucose: No results found for this or any previous visit (from the past 24 hours).    Social History   Tobacco Use  Smoking Status Former   Current packs/day: 0.00   Average packs/day: 1 pack/day for 40.0 years (40.0 ttl pk-yrs)   Types: Cigarettes   Start date: 12/31/1968   Quit date: 12/31/2008   Years since quitting: 14.4  Smokeless Tobacco Never  Tobacco Comments   10 cigs per day    Goals Met:  Proper associated with RPD/PD & O2 Sat Independence with exercise equipment Exercise tolerated well No report of concerns or symptoms today Strength training completed today  Goals Unmet:  Not Applicable  Comments: Service time is from 0959 to 1136.    Dr. Slater Staff is Medical Director for Pulmonary Rehab at [facility].

## 2023-06-03 ENCOUNTER — Encounter (HOSPITAL_COMMUNITY)
Admission: RE | Admit: 2023-06-03 | Discharge: 2023-06-03 | Disposition: A | Discharge status: Home / Self Care | Payer: No Typology Code available for payment source | Source: Ambulatory Visit | Attending: Pulmonary Disease

## 2023-06-03 DIAGNOSIS — J449 Chronic obstructive pulmonary disease, unspecified: Secondary | ICD-10-CM

## 2023-06-03 NOTE — Progress Notes (Signed)
Daily Session Note  Patient Details  Name: Arthur Harper MRN: [id number] Date of Birth: 28-Nov-1945 Referring Provider:   Doristine Devoid Pulmonary Rehab Walk Test from 03/14/2023 in [facility] for Heart, Vascular, & Lung Health  Referring Provider [name]  [[name]]       Encounter Date: 06/03/2023  Check In:  Session Check In - 06/03/23 1049       Check-In   Supervising physician immediately available to respond to emergencies [facility] immediately available    Physician(s) Hoover Browns, NP    [facility]    Staff Present Essie Hart, RN, BSN;Casey Katrinka Blazing, Zella Richer, MS, ACSM-CEP, Exercise Physiologist;Randi Dionisio Paschal, ACSM-CEP, Exercise Physiologist    Virtual Visit No    Medication changes reported     No    Fall or balance concerns reported    No    Tobacco Cessation No Change    Warm-up and Cool-down Performed as group-led instruction    Resistance Training Performed Yes    VAD Patient? No    PAD/SET Patient? No      Pain Assessment   Currently in Pain? No/denies    Multiple Pain Sites No             Capillary Blood Glucose: No results found for this or any previous visit (from the past 24 hours).    Social History   Tobacco Use  Smoking Status Former   Current packs/day: 0.00   Average packs/day: 1 pack/day for 40.0 years (40.0 ttl pk-yrs)   Types: Cigarettes   Start date: 12/31/1968   Quit date: 12/31/2008   Years since quitting: 14.4  Smokeless Tobacco Never  Tobacco Comments   10 cigs per day    Goals Met:  Proper associated with RPD/PD & O2 Sat Exercise tolerated well No report of concerns or symptoms today Strength training completed today  Goals Unmet:  Not Applicable  Comments: Service time is from 1019 to 1138.    Dr. Mechele Collin is Medical Director for Pulmonary Rehab at [facility].

## 2023-06-05 ENCOUNTER — Encounter (HOSPITAL_COMMUNITY)
Admission: RE | Admit: 2023-06-05 | Discharge: 2023-06-05 | Disposition: A | Discharge status: Home / Self Care | Payer: No Typology Code available for payment source | Source: Ambulatory Visit | Attending: Pulmonary Disease

## 2023-06-05 DIAGNOSIS — J449 Chronic obstructive pulmonary disease, unspecified: Secondary | ICD-10-CM | POA: Diagnosis not present

## 2023-06-05 NOTE — Progress Notes (Signed)
Daily Session Note  Patient Details  Name: Arthur Harper MRN: [id number] Date of Birth: 1946-02-20 Referring Provider:   Doristine Devoid Pulmonary Rehab Walk Test from 03/14/2023 in [facility] for Heart, Vascular, & Lung Health  Referring Provider [name]  [[name]]       Encounter Date: 06/05/2023  Check In:  Session Check In - 06/05/23 0956       Check-In   Supervising physician immediately available to respond to emergencies [facility] immediately available    Physician(s) Rise Paganini, NP    [facility]    Staff Present Essie Hart, RN, BSN;Pratham Cassatt Charlean Sanfilippo, MS, ACSM-CEP, Exercise Physiologist    Virtual Visit No    Medication changes reported     No    Fall or balance concerns reported    No    Tobacco Cessation No Change    Warm-up and Cool-down Performed as group-led instruction    Resistance Training Performed Yes    VAD Patient? No    PAD/SET Patient? No      Pain Assessment   Currently in Pain? No/denies    Pain Score 0-No pain             Capillary Blood Glucose: No results found for this or any previous visit (from the past 24 hours).    Social History   Tobacco Use  Smoking Status Former   Current packs/day: 0.00   Average packs/day: 1 pack/day for 40.0 years (40.0 ttl pk-yrs)   Types: Cigarettes   Start date: 12/31/1968   Quit date: 12/31/2008   Years since quitting: 14.4  Smokeless Tobacco Never  Tobacco Comments   10 cigs per day    Goals Met:  Proper associated with RPD/PD & O2 Sat Independence with exercise equipment Exercise tolerated well No report of concerns or symptoms today Strength training completed today  Goals Unmet:  Not Applicable  Comments: Service time is from 1012 to 1141.    Dr. Mechele Collin is Medical Director for Pulmonary Rehab at [facility].

## 2023-06-10 ENCOUNTER — Encounter (HOSPITAL_COMMUNITY)
Admission: RE | Admit: 2023-06-10 | Discharge: 2023-06-10 | Disposition: A | Discharge status: Home / Self Care | Payer: No Typology Code available for payment source | Source: Ambulatory Visit | Attending: Pulmonary Disease | Admitting: Pulmonary Disease

## 2023-06-10 VITALS — Wt 243.6 lb

## 2023-06-10 DIAGNOSIS — J449 Chronic obstructive pulmonary disease, unspecified: Secondary | ICD-10-CM | POA: Diagnosis not present

## 2023-06-10 NOTE — Progress Notes (Signed)
Daily Session Note  Patient Details  Name: Arthur Harper MRN: [id number] Date of Birth: December 01, 1945 Referring Provider:   Doristine Devoid Pulmonary Rehab Walk Test from 03/14/2023 in [facility] for Heart, Vascular, & Lung Health  Referring Provider [name]  [[name]]       Encounter Date: 06/10/2023  Check In:  Session Check In - 06/10/23 1026       Check-In   Supervising physician immediately available to respond to emergencies [facility] immediately available    Physician(s) Carlyon Shadow, NP    [facility]    Staff Present Essie Hart, RN, BSN;Casey Katrinka Blazing, Zella Richer, MS, ACSM-CEP, Exercise Physiologist;Randi Dionisio Paschal, ACSM-CEP, Exercise Physiologist;Olinty Peggye Pitt, MS, ACSM-CEP, Exercise Physiologist    Virtual Visit No    Medication changes reported     No    Fall or balance concerns reported    No    Tobacco Cessation No Change    Warm-up and Cool-down Performed as group-led instruction    Resistance Training Performed Yes    VAD Patient? No    PAD/SET Patient? No      Pain Assessment   Currently in Pain? No/denies    Multiple Pain Sites No             Capillary Blood Glucose: No results found for this or any previous visit (from the past 24 hours).   Exercise Prescription Changes - 06/10/23 1100       Response to Exercise   Blood Pressure (Admit) 128/64    Blood Pressure (Exit) 100/64    Heart Rate (Admit) 62 bpm    Heart Rate (Exercise) 82 bpm    Heart Rate (Exit) 67 bpm    Oxygen Saturation (Admit) 97 %    Oxygen Saturation (Exercise) 94 %    Oxygen Saturation (Exit) 96 %    Rating of Perceived Exertion (Exercise) 12    Perceived Dyspnea (Exercise) 2    Duration Continue with 30 min of aerobic exercise without signs/symptoms of physical distress.    Intensity THRR unchanged      Progression   Progression Continue to progress workloads to maintain intensity without signs/symptoms of  physical distress.      Resistance Training   Training Prescription Yes    Weight black bands    Reps 10-15    Time 10 Minutes      Recumbant Elliptical   Level 6    Minutes 15             Social History   Tobacco Use  Smoking Status Former   Current packs/day: 0.00   Average packs/day: 1 pack/day for 40.0 years (40.0 ttl pk-yrs)   Types: Cigarettes   Start date: 12/31/1968   Quit date: 12/31/2008   Years since quitting: 14.4  Smokeless Tobacco Never  Tobacco Comments   10 cigs per day    Goals Met:  Independence with exercise equipment Exercise tolerated well No report of concerns or symptoms today Strength training completed today  Goals Unmet:  Not Applicable  Comments: Service time is from 1010 to 1132    Dr. Mechele Collin is Medical Director for Pulmonary Rehab at [facility].

## 2023-06-11 NOTE — Progress Notes (Signed)
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)  Patient's Home Medications on Admission:   Current Outpatient Medications:    albuterol (VENTOLIN HFA) 108 (90 Base) MCG/ACT inhaler, Inhale 2 puffs into the lungs every 6 (six) hours as needed., Disp: 18 g, Rfl: 2   ammonium lactate (LAC-HYDRIN) 12 % lotion, Apply 1 Application topically as needed for dry skin., Disp: , Rfl:    aspirin 81 MG chewable tablet, Chew 81 mg by mouth daily., Disp: , Rfl:    atorvastatin (LIPITOR) 20 MG tablet, Take 20 mg by mouth daily., Disp: , Rfl:    carboxymethylcellulose (REFRESH PLUS) 0.5 % SOLN, INSTILL 1 DROP IN BOTH EYES FOUR TIMES A DAY AS NEEDED, Disp: , Rfl:    cetirizine (ZYRTEC) 10 MG tablet, Take by mouth., Disp: , Rfl:    citalopram (CELEXA) 10 MG tablet, Take 1 tablet (10 mg total) by mouth daily., Disp: 90 tablet, Rfl: 1   cyclobenzaprine (FLEXERIL) 10 MG tablet, Take 1 tablet (10 mg total) by mouth 2 (two) times daily as needed for up to 20 doses for muscle spasms., Disp: 20 tablet, Rfl: 0   diclofenac Sodium (VOLTAREN) 1 % GEL, Apply 2 g topically 4 (four) times daily., Disp: , Rfl:    famotidine (PEPCID) 20 MG tablet, One after supper, Disp: 30 tablet, Rfl: 11   fluticasone (FLONASE) 50 MCG/ACT nasal spray, Place 1-2 sprays into both nostrils daily., Disp: 16 g, Rfl: 2   hydrocortisone 2.5 % cream, APPLY SMALL AMOUNT TO AFFECTED AREA TWICE A DAY AS NEEDED -MIX WITH KETOCONAZOLE  CREAM AND APPLY TO THE RED AREAS UNDER ARMPITS AND  IN THE GROIN TWICE DAILY AS NEEDED -MIX WITH KETOCONAZOLE CREAM AND APPLY TO THE RED AREAS UNDER ARMPITS AND   IN THE GROIN TWICE DAILY AS NEEDED, Disp: , Rfl:    ibuprofen (ADVIL) 600 MG tablet, Take 600 mg by mouth every 6 (six) hours as needed., Disp: , Rfl:    ketoconazole (NIZORAL) 2 % cream, APPLY SMALL AMOUNT TO AFFECTED AREA TWICE A DAY -MIX WITH HYDROCORTISONE CREAM AND APPLY TO THE RED AREAS IN THE GROIN AND  ARMPIT TWICE DAILY AS NEEDED -MIX WITH HYDROCORTISONE CREAM AND APPLY TO THE RED AREAS IN THE GROIN AND  ARMPIT TWICE DAILY AS NEEDED, Disp: , Rfl:    latanoprost (XALATAN) 0.005 % ophthalmic solution, 1 drop at bedtime., Disp: , Rfl:    montelukast (SINGULAIR) 10 MG tablet, Take 1 tablet (10 mg total) by mouth at bedtime., Disp: 30 tablet, Rfl: 5   Multiple Vitamin (MULTIVITAMIN) capsule, Take 1 capsule by mouth daily., Disp: , Rfl:    omeprazole (PRILOSEC) 40 MG capsule, Take 30-60 min before first meal of the day, Disp: 30 capsule, Rfl: 11   terazosin (HYTRIN) 5 MG capsule, Take 5 mg by mouth at bedtime., Disp: , Rfl:    Tiotropium Bromide-Olodaterol (STIOLTO RESPIMAT) 2.5-2.5 MCG/ACT AERS, Inhale 2 puffs into the lungs daily., Disp: , Rfl:  traZODone (DESYREL) 50 MG tablet, Take 1 tablet (50 mg total) by mouth at bedtime as needed for sleep., Disp: 90 tablet, Rfl: 1   triamcinolone cream (KENALOG) 0.5 %, Apply 1 Application topically 3 (three) times daily., Disp: 30 g, Rfl: 2  Past Medical History: Past Medical History:  Diagnosis Date   Adenomatous polyp of colon 09/19/2021   Apr 08, 2016 Entered By: Roosevelt Locks Comment: Adenomatous polyp of the ascending colon (seven fragments) and sigmoid colon at 20cm in December 2001.     Agent orange exposure    COPD (chronic obstructive pulmonary disease) (HCC)    Coronary artery disease    Hyperlipidemia    PTSD (post-traumatic stress disorder)    Sleep apnea     Tobacco  Use: Social History   Tobacco Use  Smoking Status Former   Current packs/day: 0.00   Average packs/day: 1 pack/day for 40.0 years (40.0 ttl pk-yrs)   Types: Cigarettes   Start date: 12/31/1968   Quit date: 12/31/2008   Years since quitting: 14.4  Smokeless Tobacco Never  Tobacco Comments   10 cigs per day    Labs: Review Flowsheet  More data may exist      Latest Ref Rng & Units 12/25/2007 08/14/2012 09/07/2021 10/16/2022 02/21/2023  Labs for ITP Cardiac and Pulmonary Rehab  Cholestrol 100 - 199 mg/dL 161  096  045  409  811   LDL (calc) 0 - 99 mg/dL 914  98  80  81  58   HDL-C >39 mg/dL 78.2  95.62  67  70  74   Trlycerides 0 - 149 mg/dL 52  13.0  78  63  41   Hemoglobin A1c 4.8 - 5.6 % - - 5.4  5.5  -    Capillary Blood Glucose: No results found for: "GLUCAP"   Pulmonary Assessment Scores:  Pulmonary Assessment Scores     Row Name 03/14/23 1050 06/03/23 1515       ADL UCSD   ADL Phase Entry Exit    SOB Score total 64 43      CAT Score   CAT Score 26 23      mMRC Score   mMRC Score 2 --            UCSD: Self-administered rating of dyspnea associated with activities of daily living (ADLs) 6-point scale (0 = "not at all" to 5 = "maximal or unable to do because of breathlessness")  Scoring Scores range from 0 to 120.  Minimally important difference is 5 units  CAT: CAT can identify the health impairment of COPD patients and is better correlated with disease progression.  CAT has a scoring range of zero to 40. The CAT score is classified into four groups of low (less than 10), medium (10 - 20), high (21-30) and very high (31-40) based on the impact level of disease on health status. A CAT score over 10 suggests significant symptoms.  A worsening CAT score could be explained by an exacerbation, poor medication adherence, poor inhaler technique, or progression of COPD or comorbid conditions.  CAT MCID is 2 points  mMRC: mMRC (Modified Medical Research Council)  Dyspnea Scale is used to assess the degree of baseline functional disability in patients of respiratory disease due to dyspnea. No minimal important difference is established. A decrease in score of 1 point or greater is considered a positive change.   Pulmonary Function Assessment:  Pulmonary Function Assessment - 03/14/23 1030  Breath   Bilateral Breath Sounds Clear;Decreased    Shortness of Breath Yes;Limiting activity             Exercise Target Goals: Exercise Program Goal: Individual exercise prescription set using results from initial 6 min walk test and THRR while considering  patient's activity barriers and safety.   Exercise Prescription Goal: Initial exercise prescription builds to 30-45 minutes a day of aerobic activity, 2-3 days per week.  Home exercise guidelines will be given to patient during program as part of exercise prescription that the participant will acknowledge.  Activity Barriers & Risk Stratification:  Activity Barriers & Cardiac Risk Stratification - 03/14/23 1029       Activity Barriers & Cardiac Risk Stratification   Activity Barriers Deconditioning;Muscular Weakness;Shortness of Breath;Arthritis    Cardiac Risk Stratification Moderate             6 Minute Walk:  6 Minute Walk     Row Name 03/14/23 1111 06/10/23 1208       6 Minute Walk   Phase Initial Discharge    Distance 1280 feet 1610 feet    Distance % Change -- 25.78 %    Distance Feet Change -- 330 ft    Walk Time 6 minutes 6 minutes    # of Rest Breaks 0 0    MPH 2.42 1.22    METS 10.95 2.7    RPE 12 12    Perceived Dyspnea  3 2    VO2 Peak 6.84 9.46    Symptoms No No    Resting HR 61 bpm 64 bpm    Resting BP 142/70 128/64    Resting Oxygen Saturation  95 % 98 %    Exercise Oxygen Saturation  during 6 min walk 89 % 93 %    Max Ex. HR 79 bpm 93 bpm    Max Ex. BP 152/68 160/70    2 Minute Post BP 128/70 132/70      Interval HR   1 Minute HR 63 75    2 Minute  HR 63 86    3 Minute HR 65 88    4 Minute HR 63 96    5 Minute HR 63 92    6 Minute HR 79 93    2 Minute Post HR 64 78    Interval Heart Rate? Yes Yes      Interval Oxygen   Interval Oxygen? Yes Yes    Baseline Oxygen Saturation % 95 % 98 %    1 Minute Oxygen Saturation % 93 % 98 %    1 Minute Liters of Oxygen 0 L 0 L    2 Minute Oxygen Saturation % 89 % 94 %    2 Minute Liters of Oxygen 0 L 0 L    3 Minute Oxygen Saturation % 90 % 93 %    3 Minute Liters of Oxygen 0 L 0 L    4 Minute Oxygen Saturation % 90 % 93 %    4 Minute Liters of Oxygen 0 L 0 L    5 Minute Oxygen Saturation % 91 % 93 %    5 Minute Liters of Oxygen 0 L 0 L    6 Minute Oxygen Saturation % 90 % 93 %    6 Minute Liters of Oxygen 0 L 0 L    2 Minute Post Oxygen Saturation % 95 % 96 %    2 Minute Post Liters of Oxygen 0 L 0  L             Oxygen Initial Assessment:  Oxygen Initial Assessment - 03/14/23 1030       Home Oxygen   Home Oxygen Device None    Sleep Oxygen Prescription CPAP    Home Exercise Oxygen Prescription None    Home Resting Oxygen Prescription None      Initial 6 min Walk   Oxygen Used None      Program Oxygen Prescription   Program Oxygen Prescription None      Intervention   Short Term Goals To learn and understand importance of monitoring SPO2 with pulse oximeter and demonstrate accurate use of the pulse oximeter.;To learn and understand importance of maintaining oxygen saturations>88%;To learn and demonstrate proper use of respiratory medications;To learn and demonstrate proper pursed lip breathing techniques or other breathing techniques.     Long  Term Goals Maintenance of O2 saturations>88%;Compliance with respiratory medication;Verbalizes importance of monitoring SPO2 with pulse oximeter and return demonstration;Exhibits proper breathing techniques, such as pursed lip breathing or other method taught during program session;Demonstrates proper use of MDI's              Oxygen Re-Evaluation:  Oxygen Re-Evaluation     Row Name 03/14/23 1030 04/07/23 0730 05/09/23 1328 06/04/23 0858       Program Oxygen Prescription   Program Oxygen Prescription -- None None None      Home Oxygen   Home Oxygen Device -- None None None    Sleep Oxygen Prescription -- CPAP CPAP CPAP    Home Exercise Oxygen Prescription -- None None None    Home Resting Oxygen Prescription -- None None None      Goals/Expected Outcomes   Short Term Goals -- To learn and understand importance of monitoring SPO2 with pulse oximeter and demonstrate accurate use of the pulse oximeter.;To learn and understand importance of maintaining oxygen saturations>88%;To learn and demonstrate proper use of respiratory medications;To learn and demonstrate proper pursed lip breathing techniques or other breathing techniques.  To learn and understand importance of monitoring SPO2 with pulse oximeter and demonstrate accurate use of the pulse oximeter.;To learn and understand importance of maintaining oxygen saturations>88%;To learn and demonstrate proper use of respiratory medications;To learn and demonstrate proper pursed lip breathing techniques or other breathing techniques.  To learn and understand importance of monitoring SPO2 with pulse oximeter and demonstrate accurate use of the pulse oximeter.;To learn and understand importance of maintaining oxygen saturations>88%;To learn and demonstrate proper use of respiratory medications;To learn and demonstrate proper pursed lip breathing techniques or other breathing techniques.     Long  Term Goals -- Maintenance of O2 saturations>88%;Compliance with respiratory medication;Verbalizes importance of monitoring SPO2 with pulse oximeter and return demonstration;Exhibits proper breathing techniques, such as pursed lip breathing or other method taught during program session;Demonstrates proper use of MDI's Maintenance of O2 saturations>88%;Compliance with respiratory  medication;Verbalizes importance of monitoring SPO2 with pulse oximeter and return demonstration;Exhibits proper breathing techniques, such as pursed lip breathing or other method taught during program session;Demonstrates proper use of MDI's Maintenance of O2 saturations>88%;Compliance with respiratory medication;Verbalizes importance of monitoring SPO2 with pulse oximeter and return demonstration;Exhibits proper breathing techniques, such as pursed lip breathing or other method taught during program session;Demonstrates proper use of MDI's    Goals/Expected Outcomes Compliance and understanding of oxygen saturation monitoring and breathing techniques to decrease shortness of breath. Compliance and understanding of oxygen saturation monitoring and breathing techniques to decrease shortness of breath. Compliance and understanding  of oxygen saturation monitoring and breathing techniques to decrease shortness of breath. Compliance and understanding of oxygen saturation monitoring and breathing techniques to decrease shortness of breath.             Oxygen Discharge (Final Oxygen Re-Evaluation):  Oxygen Re-Evaluation - 06/04/23 0858       Program Oxygen Prescription   Program Oxygen Prescription None      Home Oxygen   Home Oxygen Device None    Sleep Oxygen Prescription CPAP    Home Exercise Oxygen Prescription None    Home Resting Oxygen Prescription None      Goals/Expected Outcomes   Short Term Goals To learn and understand importance of monitoring SPO2 with pulse oximeter and demonstrate accurate use of the pulse oximeter.;To learn and understand importance of maintaining oxygen saturations>88%;To learn and demonstrate proper use of respiratory medications;To learn and demonstrate proper pursed lip breathing techniques or other breathing techniques.     Long  Term Goals Maintenance of O2 saturations>88%;Compliance with respiratory medication;Verbalizes importance of monitoring SPO2 with  pulse oximeter and return demonstration;Exhibits proper breathing techniques, such as pursed lip breathing or other method taught during program session;Demonstrates proper use of MDI's    Goals/Expected Outcomes Compliance and understanding of oxygen saturation monitoring and breathing techniques to decrease shortness of breath.             Initial Exercise Prescription:  Initial Exercise Prescription - 03/14/23 1100       Date of Initial Exercise RX and Referring Provider   Date 03/14/23    Referring Provider Vivia Budge   Expected Discharge Date 06/04/22      Treadmill   MPH 1.8    Grade 0    Minutes 15      Recumbant Elliptical   Level 2    RPM 60    Watts 40    Minutes 15      Prescription Details   Frequency (times per week) 2    Duration Progress to 30 minutes of continuous aerobic without signs/symptoms of physical distress      Intensity   THRR 40-80% of Max Heartrate 57-114    Ratings of Perceived Exertion 11-13    Perceived Dyspnea 0-4      Progression   Progression Continue progressive overload as per policy without signs/symptoms or physical distress.      Resistance Training   Training Prescription Yes    Weight blue bands    Reps 10-15             Perform Capillary Blood Glucose checks as needed.  Exercise Prescription Changes:   Exercise Prescription Changes     Row Name 03/18/23 1100 04/01/23 1100 04/15/23 1100 04/29/23 1400 05/08/23 1209     Response to Exercise   Blood Pressure (Admit) 128/66 122/68 140/60 108/60 110/64   Blood Pressure (Exercise) 126/64 140/70 132/66 182/70 --   Blood Pressure (Exit) 112/66 102/60 120/70 94/60 120/64   Heart Rate (Admit) 71 bpm 60 bpm 74 bpm 73 bpm 64 bpm   Heart Rate (Exercise) 88 bpm 78 bpm 95 bpm 92 bpm 94 bpm   Heart Rate (Exit) 74 bpm 72 bpm 79 bpm 69 bpm 78 bpm   Oxygen Saturation (Admit) 95 % 97 % 96 % 95 % 96 %   Oxygen Saturation (Exercise) 95 % 94 % 93 % 93 % 92 %   Oxygen  Saturation (Exit) 95 % 93 % 95 % 96 % 95 %  Rating of Perceived Exertion (Exercise) 13 13 12 13 12    Perceived Dyspnea (Exercise) 3 2 2 2 2    Duration Continue with 30 min of aerobic exercise without signs/symptoms of physical distress. Continue with 30 min of aerobic exercise without signs/symptoms of physical distress. Continue with 30 min of aerobic exercise without signs/symptoms of physical distress. Continue with 30 min of aerobic exercise without signs/symptoms of physical distress. Continue with 30 min of aerobic exercise without signs/symptoms of physical distress.   Intensity THRR unchanged THRR unchanged THRR unchanged THRR unchanged THRR unchanged     Progression   Progression -- Continue to progress workloads to maintain intensity without signs/symptoms of physical distress. Continue to progress workloads to maintain intensity without signs/symptoms of physical distress. Continue to progress workloads to maintain intensity without signs/symptoms of physical distress. Continue to progress workloads to maintain intensity without signs/symptoms of physical distress.   Average METs -- 2.99 -- -- --     Resistance Training   Training Prescription Yes Yes Yes Yes Yes   Weight blue bands blue bands black bands black bands black bands   Reps 10-15 10-15 10-15 10-15 10-15   Time 10 Minutes 10 Minutes 10 Minutes 10 Minutes 10 Minutes     Treadmill   MPH 1.8 2.2 2.5 2.5 2.5   Grade 0 1 1 2 2    Minutes 15 15 15 15 15    METs 2.2 2.99 3.26 3.6 3.4     Recumbant Elliptical   Level 2 3 4 8   2-8 5   RPM 46 -- 55 -- 55   Watts -- -- 73 -- 76   Minutes 15 15 15 15 15    METs 1.8 2.2 2.5 2.9 2.6    Row Name 05/27/23 1200 05/29/23 1500 06/10/23 1100         Response to Exercise   Blood Pressure (Admit) 112/66 -- 128/64     Blood Pressure (Exercise) 144/74 -- --     Blood Pressure (Exit) 112/60 -- 100/64     Heart Rate (Admit) 62 bpm -- 62 bpm     Heart Rate (Exercise) 89 bpm -- 82 bpm      Heart Rate (Exit) 66 bpm -- 67 bpm     Oxygen Saturation (Admit) 96 % -- 97 %     Oxygen Saturation (Exercise) 94 % -- 94 %     Oxygen Saturation (Exit) 96 % -- 96 %     Rating of Perceived Exertion (Exercise) 13 -- 12     Perceived Dyspnea (Exercise) 2 -- 2     Duration Continue with 30 min of aerobic exercise without signs/symptoms of physical distress. -- Continue with 30 min of aerobic exercise without signs/symptoms of physical distress.     Intensity THRR unchanged -- THRR unchanged       Progression   Progression Continue to progress workloads to maintain intensity without signs/symptoms of physical distress. -- Continue to progress workloads to maintain intensity without signs/symptoms of physical distress.       Resistance Training   Training Prescription Yes -- Yes     Weight black bands -- black bands     Reps 10-15 -- 10-15     Time 10 Minutes -- 10 Minutes       Treadmill   MPH 2.5 -- --     Grade 2 -- --     Minutes 15 -- --     METs 3.4 -- --  Recumbant Elliptical   Level 5 -- 6     RPM 57 -- --     Watts 83 -- --     Minutes 15 -- 15     METs 2.8 -- 3       Home Exercise Plan   Plans to continue exercise at -- [employer] (comment)  [employer] --     Frequency -- Add 1 additional day to program exercise sessions. --     Initial Home Exercises Provided -- 05/29/23 --              Exercise Comments:   Exercise Comments     Row Name 03/18/23 1156 05/29/23 1538         Exercise Comments Pt completed his first day of group exercise. He exercised on the treadmill for 15 min, 1.8 mph, 0 incline, METs 2.2. He then exercised on the recumbent elliptical for 15 min, level 2, METs 1.8. Pt tolerated well although admits to fatigue. He performed warm up and cool down without limitations, including squats. Discussed METs with fair comprehension. Completed home exercise plan. Omair is not curently exercising at home. I encouraged him to start exercising  outside of rehab. Kallon has [employer] and can use this program at [employer]. He told me he would visit the [facility] soon. I recommended walking on the treadmill, cycling, or doing anything similar to what he does in rehab for 1 non-rehab day/wk for 30 min/day. Adeyemi agreed with my recommendations. I am unsure how likely he is to exercise outside of rehab.               Exercise Goals and Review:   Exercise Goals     Row Name 03/14/23 1029             Exercise Goals   Increase Physical Activity Yes       Intervention Provide advice, education, support and counseling about physical activity/exercise needs.;Develop an individualized exercise prescription for aerobic and resistive training based on initial evaluation findings, risk stratification, comorbidities and participant's personal goals.       Expected Outcomes Short Term: Attend rehab on a regular basis to increase amount of physical activity.;Long Term: Add in home exercise to make exercise part of routine and to increase amount of physical activity.;Long Term: Exercising regularly at least 3-5 days a week.       Increase Strength and Stamina Yes       Intervention Provide advice, education, support and counseling about physical activity/exercise needs.;Develop an individualized exercise prescription for aerobic and resistive training based on initial evaluation findings, risk stratification, comorbidities and participant's personal goals.       Expected Outcomes Short Term: Increase workloads from initial exercise prescription for resistance, speed, and METs.;Short Term: Perform resistance training exercises routinely during rehab and add in resistance training at home;Long Term: Improve cardiorespiratory fitness, muscular endurance and strength as measured by increased METs and functional capacity ( )       Able to understand and use rate of perceived exertion (RPE) scale Yes       Intervention Provide education and  explanation on how to use RPE scale       Expected Outcomes Short Term: Able to use RPE daily in rehab to express subjective intensity level;Long Term:  Able to use RPE to guide intensity level when exercising independently       Able to understand and use Dyspnea scale Yes  Intervention Provide education and explanation on how to use Dyspnea scale       Expected Outcomes Short Term: Able to use Dyspnea scale daily in rehab to express subjective sense of shortness of breath during exertion;Long Term: Able to use Dyspnea scale to guide intensity level when exercising independently       Knowledge and understanding of Target Heart Rate Range (THRR) Yes       Intervention Provide education and explanation of THRR including how the numbers were predicted and where they are located for reference       Expected Outcomes Short Term: Able to state/look up THRR;Long Term: Able to use THRR to govern intensity when exercising independently;Short Term: Able to use daily as guideline for intensity in rehab       Understanding of Exercise Prescription Yes       Intervention Provide education, explanation, and written materials on patient's individual exercise prescription       Expected Outcomes Short Term: Able to explain program exercise prescription;Long Term: Able to explain home exercise prescription to exercise independently                Exercise Goals Re-Evaluation :  Exercise Goals Re-Evaluation     Row Name 03/14/23 1113 04/07/23 0727 05/09/23 1320 06/04/23 0854       Exercise Goal Re-Evaluation   Exercise Goals Review Increase Physical Activity;Able to understand and use Dyspnea scale;Understanding of Exercise Prescription;Increase Strength and Stamina;Knowledge and understanding of Target Heart Rate Range (THRR);Able to understand and use rate of perceived exertion (RPE) scale Increase Physical Activity;Able to understand and use Dyspnea scale;Understanding of Exercise  Prescription;Increase Strength and Stamina;Knowledge and understanding of Target Heart Rate Range (THRR);Able to understand and use rate of perceived exertion (RPE) scale Increase Physical Activity;Able to understand and use Dyspnea scale;Understanding of Exercise Prescription;Increase Strength and Stamina;Knowledge and understanding of Target Heart Rate Range (THRR);Able to understand and use rate of perceived exertion (RPE) scale Increase Physical Activity;Able to understand and use Dyspnea scale;Understanding of Exercise Prescription;Increase Strength and Stamina;Knowledge and understanding of Target Heart Rate Range (THRR);Able to understand and use rate of perceived exertion (RPE) scale    Comments Tiburcio Pea is scheduled to exercise 11/26. Will continue to monitor and progress as able. Tiburcio Pea has completed 5 exercise sessions. He exercises for 15 min on the treadmill and recumbent elliptical. He averages 2.8 METs at 2.2 mph and 1% on the treadmill and 3.7 METs at level 3 on the recumbent elliptical. He performs the warmup and cooldown standing without limitations. Tiburcio Pea has increased his workloads for both exercise modes as METs have significantly increased. Harris tolerates progressions well. We have not discussed home exercise yet as Tiburcio Pea is the next patient I am planning to talk to. Will conitnue to monitor and progress as able. Tiburcio Pea has completed 15 exercise sessions. He exercises for 15 min on the treadmill and recumbent elliptical. He averages 3.4 METs at 2.5 mph and 2% on the treadmill and 2.6 METs at level 5 on the recumbent elliptical. He performs the warmup and cooldown standing without limitations. Tiburcio Pea has increased his speed and incline on the tredmill as METs have significantly increased. He has increased his level on the recumbent elliptical several times although METs have remained relatively the same. I have recently briefly discussed home exercise. I plan to finish this conversation next  session. Will continue to monitor and progress as able. Tiburcio Pea has completed 20 exercise sessions. He exercises for 15 min on the treadmill  and recumbent elliptical. He averages 3.4 METs at 2.5 mph and 2% on the treadmill and 2.6 METs at level 5 on the recumbent elliptical. He performs the warmup and cooldown standing without limitations. Tiburcio Pea has not progressed since last month. There is a possibility he is reaching his maximum functional capacity. I have discussed home exercise with Harris as I encouraged him to start exercising at his local [employer]. Will continue to monitor and progress as able.    Expected Outcomes Through exercise at rehab and home, the patient will decrease shortness of breath with daily activities and feel confident in carrying out an exercise regimen at home. Through exercise at rehab and home, the patient will decrease shortness of breath with daily activities and feel confident in carrying out an exercise regimen at home. Through exercise at rehab and home, the patient will decrease shortness of breath with daily activities and feel confident in carrying out an exercise regimen at home. Through exercise at rehab and home, the patient will decrease shortness of breath with daily activities and feel confident in carrying out an exercise regimen at home.             Discharge Exercise Prescription (Final Exercise Prescription Changes):  Exercise Prescription Changes - 06/10/23 1100       Response to Exercise   Blood Pressure (Admit) 128/64    Blood Pressure (Exit) 100/64    Heart Rate (Admit) 62 bpm    Heart Rate (Exercise) 82 bpm    Heart Rate (Exit) 67 bpm    Oxygen Saturation (Admit) 97 %    Oxygen Saturation (Exercise) 94 %    Oxygen Saturation (Exit) 96 %    Rating of Perceived Exertion (Exercise) 12    Perceived Dyspnea (Exercise) 2    Duration Continue with 30 min of aerobic exercise without signs/symptoms of physical distress.    Intensity THRR unchanged       Progression   Progression Continue to progress workloads to maintain intensity without signs/symptoms of physical distress.      Resistance Training   Training Prescription Yes    Weight black bands    Reps 10-15    Time 10 Minutes      Recumbant Elliptical   Level 6    Minutes 15    METs 3             Nutrition:  Target Goals: Understanding of nutrition guidelines, daily intake of sodium 1500mg , cholesterol 200mg , calories 30% from fat and 7% or less from saturated fats, daily to have 5 or more servings of fruits and vegetables.  Biometrics:  Pre Biometrics - 03/14/23 1138       Pre Biometrics   Grip Strength 32 kg              Nutrition Therapy Plan and Nutrition Goals:   Nutrition Assessments:  MEDIFICTS Score Key: >=70 Need to make dietary changes  40-70 Heart Healthy Diet <= 40 Therapeutic Level Cholesterol Diet   Picture Your Plate Scores: <40 Unhealthy dietary pattern with much room for improvement. 41-50 Dietary pattern unlikely to meet recommendations for good health and room for improvement. 51-60 More healthful dietary pattern, with some room for improvement.  >60 Healthy dietary pattern, although there may be some specific behaviors that could be improved.    Nutrition Goals Re-Evaluation:   Nutrition Goals Discharge (Final Nutrition Goals Re-Evaluation):   Psychosocial: Target Goals: Acknowledge presence or absence of significant depression and/or stress, maximize coping skills, provide  positive support system. Participant is able to verbalize types and ability to use techniques and skills needed for reducing stress and depression.  Initial Review & Psychosocial Screening:  Initial Psych Review & Screening - 03/14/23 1025       Initial Review   Current issues with Current Depression;Current Psychotropic Meds;Current Stress Concerns    Source of Stress Concerns Family    Comments Pt is dealing with grief from recently losing one of  his daughters. He is also dealing with family stress that has to do with his other daughter.      Family Dynamics   Good Support System? Yes    Concerns Recent loss of child      Barriers   Psychosocial barriers to participate in program The patient should benefit from training in stress management and relaxation.;Psychosocial barriers identified (see note)      Screening Interventions   Interventions Encouraged to exercise;To provide support and resources with identified psychosocial needs    Expected Outcomes Short Term goal: Utilizing psychosocial counselor, staff and physician to assist with identification of specific Stressors or current issues interfering with healing process. Setting desired goal for each stressor or current issue identified.;Long Term Goal: Stressors or current issues are controlled or eliminated.;Short Term goal: Identification and review with participant of any Quality of Life or Depression concerns found by scoring the questionnaire.;Long Term goal: The participant improves quality of Life and PHQ9 Scores as seen by post scores and/or verbalization of changes             Quality of Life Scores:  Scores of 19 and below usually indicate a poorer quality of life in these areas.  A difference of  2-3 points is a clinically meaningful difference.  A difference of 2-3 points in the total score of the Quality of Life Index has been associated with significant improvement in overall quality of life, self-image, physical symptoms, and general health in studies assessing change in quality of life.  PHQ-9: Review Flowsheet  More data exists      04/29/2023 03/14/2023 12/10/2022 10/15/2022 07/15/2022  Depression screen PHQ 2/9  Decreased Interest 1 0 0 0 0 0  Down, Depressed, Hopeless 0 1 0 0 0 0  PHQ - 2 Score 1 1 0 0 0 0  Altered sleeping 0 1 0 0 0 0  Tired, decreased energy 0 1 0 0 3 0  Change in appetite 0 1 0 0 1 0  Feeling bad or failure about yourself  0 0 0 0 0 0   Trouble concentrating 0 0 0 0 0 0  Moving slowly or fidgety/restless 0 0 0 0 0 0  Suicidal thoughts 0 0 0 0 0 0  PHQ-9 Score 1 4 0 0 4 0  Difficult doing work/chores Not difficult at all Not difficult at all Not difficult at all Not difficult at all Not difficult at all -    Details       Multiple values from one day are sorted in reverse-chronological order        Interpretation of Total Score  Total Score Depression Severity:  1-4 = Minimal depression, 5-9 = Mild depression, 10-14 = Moderate depression, 15-19 = Moderately severe depression, 20-27 = Severe depression   Psychosocial Evaluation and Intervention:  Psychosocial Evaluation - 03/14/23 1026       Psychosocial Evaluation & Interventions   Interventions Encouraged to exercise with the program and follow exercise prescription;Stress management education    Comments Pt  is dealing with grief from recently losing one of his daughters. He is also dealing with family stress that has to do with his other daughter. He is currently on psychotropic meds and is compliant with taking them. He also see's a counseler for his PTSD.    Expected Outcomes For Zymier's depression and stress to be decreased during the PR program    Continue Psychosocial Services  Follow up required by staff             Psychosocial Re-Evaluation:  Psychosocial Re-Evaluation     Row Name 03/17/23 0943 04/07/23 1316 05/05/23 0910 06/02/23 1400       Psychosocial Re-Evaluation   Current issues with Current Depression;Current Psychotropic Meds;Current Stress Concerns Current Depression;Current Psychotropic Meds;Current Stress Concerns Current Psychotropic Meds;Current Stress Concerns;Current Depression Current Psychotropic Meds;Current Stress Concerns;Current Depression    Comments Tyrek is scheduled to start PR on 03/18/23. No new psychosocial barriers or concerns since orientation on 03/14/23. Jayceion feels that his depression is stable at this  time. He has a little stress due to the holiday's, but is handling it well. He is compliant with taking his psychotropic meds and meeting with his therapist. No new barriers or concerns at this time. Jamontae continues to feel that his depression is stable at this time. He states that coming to class and exercising has helped his mental health. He is always smiling and laughing in class. He is still compliant with taking his psychotropic meds and meeting with his therapist.  No new psychosocial barriers or concerns at this time. Rosalio denies any new psychosocial barriers or concerns at this time. His depression is stable at this time. He is still compliant with taking his psychotropic meds and meeting with his therapist.    Expected Outcomes For Kyler to participate in PR free of any psychosocial barriers or concerns For Harshal to participate in PR free of any psychosocial barriers or concerns For Lorren to participate in PR free of any psychosocial barriers or concerns For Xaidyn to participate in PR free of any psychosocial barriers or concerns    Interventions Encouraged to attend Pulmonary Rehabilitation for the exercise Encouraged to attend Pulmonary Rehabilitation for the exercise Encouraged to attend Pulmonary Rehabilitation for the exercise Encouraged to attend Pulmonary Rehabilitation for the exercise    Continue Psychosocial Services  Follow up required by staff No Follow up required No Follow up required No Follow up required             Psychosocial Discharge (Final Psychosocial Re-Evaluation):  Psychosocial Re-Evaluation - 06/02/23 1400       Psychosocial Re-Evaluation   Current issues with Current Psychotropic Meds;Current Stress Concerns;Current Depression    Comments Yarel denies any new psychosocial barriers or concerns at this time. His depression is stable at this time. He is still compliant with taking his psychotropic meds and meeting with his therapist.     Expected Outcomes For Daymond to participate in PR free of any psychosocial barriers or concerns    Interventions Encouraged to attend Pulmonary Rehabilitation for the exercise    Continue Psychosocial Services  No Follow up required             Education: Education Goals: Education classes will be provided on a weekly basis, covering required topics. Participant will state understanding/return demonstration of topics presented.  Learning Barriers/Preferences:  Learning Barriers/Preferences - 03/14/23 1026       Learning Barriers/Preferences   Learning Barriers Sight;Hearing   wears glasses and hearing  aids   Learning Preferences Computer/Internet;Group Instruction;Individual Instruction;Skilled Demonstration;Written Material             Education Topics: Know Your Numbers Group instruction that is supported by a PowerPoint presentation. Instructor discusses importance of knowing and understanding resting, exercise, and post-exercise oxygen saturation, heart rate, and blood pressure. Oxygen saturation, heart rate, blood pressure, rating of perceived exertion, and dyspnea are reviewed along with a normal range for these values.  Flowsheet Row PULMONARY REHAB CHRONIC OBSTRUCTIVE PULMONARY DISEASE from 04/24/2023 in [facility]  Date 04/24/23  Educator EP  Instruction Review Code 1- Verbalizes Understanding       Exercise for the Pulmonary Patient Group instruction that is supported by a PowerPoint presentation. Instructor discusses benefits of exercise, core components of exercise, frequency, duration, and intensity of an exercise routine, importance of utilizing pulse oximetry during exercise, safety while exercising, and options of places to exercise outside of rehab.  Flowsheet Row PULMONARY REHAB CHRONIC OBSTRUCTIVE PULMONARY DISEASE from 04/17/2023 in [facility] for Heart, Vascular, & Lung  Health  Date 04/17/23  Educator EP  Instruction Review Code 1- Verbalizes Understanding       MET Level  Group instruction provided by PowerPoint, verbal discussion, and written material to support subject matter. Instructor reviews what METs are and how to increase METs.    Pulmonary Medications Verbally interactive group education provided by instructor with focus on inhaled medications and proper administration. Flowsheet Row PULMONARY REHAB CHRONIC OBSTRUCTIVE PULMONARY DISEASE from 04/10/2023 in [facility] for Heart, Vascular, & Lung Health  Date 04/10/23  Educator RT  Instruction Review Code 1- Verbalizes Understanding       Anatomy and Physiology of the Respiratory System Group instruction provided by PowerPoint, verbal discussion, and written material to support subject matter. Instructor reviews respiratory cycle and anatomical components of the respiratory system and their functions. Instructor also reviews differences in obstructive and restrictive respiratory diseases with examples of each.  Flowsheet Row PULMONARY REHAB CHRONIC OBSTRUCTIVE PULMONARY DISEASE from 04/03/2023 in [facility] for Heart, Vascular, & Lung Health  Date 04/03/23  Educator RT  Instruction Review Code 1- Verbalizes Understanding       Oxygen Safety Group instruction provided by PowerPoint, verbal discussion, and written material to support subject matter. There is an overview of "What is Oxygen" and "Why do we need it".  Instructor also reviews how to create a safe environment for oxygen use, the importance of using oxygen as prescribed, and the risks of noncompliance. There is a brief discussion on traveling with oxygen and resources the patient may utilize. Flowsheet Row PULMONARY REHAB CHRONIC OBSTRUCTIVE PULMONARY DISEASE from 05/01/2023 in [facility] for Heart, Vascular, & Lung Health  Date 05/01/23  Educator RN   Instruction Review Code 1- Verbalizes Understanding       Oxygen Use Group instruction provided by PowerPoint, verbal discussion, and written material to discuss how supplemental oxygen is prescribed and different types of oxygen supply systems. Resources for more information are provided.  Flowsheet Row PULMONARY REHAB CHRONIC OBSTRUCTIVE PULMONARY DISEASE from 05/08/2023 in [facility] for Heart, Vascular, & Lung Health  Date 05/08/23  Educator RT  Instruction Review Code 1- Verbalizes Understanding       Breathing Techniques Group instruction that is supported by demonstration and informational handouts. Instructor discusses the benefits of pursed lip and diaphragmatic breathing and detailed  demonstration on how to perform both.     Risk Factor Reduction Group instruction that is supported by a PowerPoint presentation. Instructor discusses the definition of a risk factor, different risk factors for pulmonary disease, and how the heart and lungs work together. Flowsheet Row PULMONARY REHAB CHRONIC OBSTRUCTIVE PULMONARY DISEASE from 06/05/2023 in [facility] for Heart, Vascular, & Lung Health  Date 06/05/23  Educator EP  Instruction Review Code 1- Verbalizes Understanding       Pulmonary Diseases Group instruction provided by PowerPoint, verbal discussion, and written material to support subject matter. Instructor gives an overview of the different type of pulmonary diseases. There is also a discussion on risk factors and symptoms as well as ways to manage the diseases. Flowsheet Row PULMONARY REHAB CHRONIC OBSTRUCTIVE PULMONARY DISEASE from 03/27/2023 in [facility] for Heart, Vascular, & Lung Health  Date 03/27/23  Educator RT  Instruction Review Code 1- Verbalizes Understanding       Stress and Energy Conservation Group instruction provided by PowerPoint, verbal discussion, and written material to  support subject matter. Instructor gives an overview of stress and the impact it can have on the body. Instructor also reviews ways to reduce stress. There is also a discussion on energy conservation and ways to conserve energy throughout the day. Flowsheet Row PULMONARY REHAB CHRONIC OBSTRUCTIVE PULMONARY DISEASE from 05/22/2023 in [facility] for Heart, Vascular, & Lung Health  Date 05/22/23  Educator RN  Instruction Review Code 1- Verbalizes Understanding       Warning Signs and Symptoms Group instruction provided by PowerPoint, verbal discussion, and written material to support subject matter. Instructor reviews warning signs and symptoms of stroke, heart attack, cold and flu. Instructor also reviews ways to prevent the spread of infection. Flowsheet Row PULMONARY REHAB CHRONIC OBSTRUCTIVE PULMONARY DISEASE from 05/29/2023 in [facility] for Heart, Vascular, & Lung Health  Date 05/29/23  Educator RN  Instruction Review Code 1- Verbalizes Understanding       Other Education Group or individual verbal, written, or video instructions that support the educational goals of the pulmonary rehab program.    Knowledge Questionnaire Score:  Knowledge Questionnaire Score - 06/03/23 1514       Knowledge Questionnaire Score   Post Score 17/18             Core Components/Risk Factors/Patient Goals at Admission:  Personal Goals and Risk Factors at Admission - 03/14/23 1027       Core Components/Risk Factors/Patient Goals on Admission   Improve shortness of breath with ADL's Yes    Intervention Provide education, individualized exercise plan and daily activity instruction to help decrease symptoms of SOB with activities of daily living.    Expected Outcomes Short Term: Improve cardiorespiratory fitness to achieve a reduction of symptoms when performing ADLs;Long Term: Be able to perform more ADLs without symptoms or delay the onset of  symptoms    Increase knowledge of respiratory medications and ability to use respiratory devices properly  Yes    Intervention Provide education and demonstration as needed of appropriate use of medications, inhalers, and oxygen therapy.    Expected Outcomes Short Term: Achieves understanding of medications use. Understands that oxygen is a medication prescribed by physician. Demonstrates appropriate use of inhaler and oxygen therapy.;Long Term: Maintain appropriate use of medications, inhalers, and oxygen therapy.             Core Components/Risk Factors/Patient Goals Review:  Goals and Risk Factor Review     Row Name 03/17/23 0947 04/07/23 1318 05/05/23 0913 06/02/23 1402       Core Components/Risk Factors/Patient Goals Review   Personal Goals Review Improve shortness of breath with ADL's;Develop more efficient breathing techniques such as purse lipped breathing and diaphragmatic breathing and practicing self-pacing with activity.;Increase knowledge of respiratory medications and ability to use respiratory devices properly. Improve shortness of breath with ADL's;Develop more efficient breathing techniques such as purse lipped breathing and diaphragmatic breathing and practicing self-pacing with activity.;Increase knowledge of respiratory medications and ability to use respiratory devices properly. Improve shortness of breath with ADL's;Develop more efficient breathing techniques such as purse lipped breathing and diaphragmatic breathing and practicing self-pacing with activity. Improve shortness of breath with ADL's    Review Brayn is scheduled to start PR on 03/18/23. Goal progressing for improve shortness of breath with ADL's. Goal progressing for developing more efficient breathing techniques such as purse lipped breathing and diaphragmatic breathing; and practicing self-pacing with activity. Goal progressing for increasing knowledge of respiratory medications and ability to use  respiratory devices properly. Goal progressing for improve shortness of breath with ADL's. Cleland remains on RA while exercising with sats >88%. Goal progressing for developing more efficient breathing techniques such as purse lipped breathing and diaphragmatic breathing; and practicing self-pacing with activity. Goal met for increasing knowledge of respiratory medications and ability to use respiratory devices properly. Rhyse is able to demonstrate proper technique with MDI to staff. We will continue to monitor Maurion's progress throughout the program. Goal progressing for improve shortness of breath with ADL's. Evo remains on RA while exercising with sats >88%. Goal met for developing more efficient breathing techniques such as purse lipped breathing and diaphragmatic breathing; and practicing self-pacing with activity. Jakel demonstrates purse lip breathing when he gets short of breath. He also knows how to pace himself based on the RPE scale. We will continue to monitor Shelden's progress throughout the program. Goal progressing for improving shortness of breath with ADL's. Eliam remains on RA while exercising with sats >88%. Derril is currently exercising on the treadmill and the recumbant elliptical. Jaquavion looks forward to coming to class every week. He is a pleasure to have in class. We will continue to monitor Caisen's progress throughout the program.    Expected Outcomes For Yared to improve shortness of breath with ADL's, develop more efficient breathing techniques such as purse lipped breathing and diaphragmatic breathing; and practicing self-pacing with activity and increase knowledge of respiratory medications and ability to use respiratory devices properly. For Romeo Apple to improve shortness of breath with ADL's and develop more efficient breathing techniques such as purse lipped breathing and diaphragmatic breathing; and practicing self-pacing with activity. For Romeo Apple  to improve shortness of breath with ADL's For Romeo Apple to improve shortness of breath with ADL's             Core Components/Risk Factors/Patient Goals at Discharge (Final Review):   Goals and Risk Factor Review - 06/02/23 1402       Core Components/Risk Factors/Patient Goals Review   Personal Goals Review Improve shortness of breath with ADL's    Review Goal progressing for improving shortness of breath with ADL's. Barbara remains on RA while exercising with sats >88%. Teddy is currently exercising on the treadmill and the recumbant elliptical. Gilliam looks forward to coming to class every week. He is a pleasure to have in class. We will continue to monitor Seneca's progress throughout the program.  Expected Outcomes For Jamian to improve shortness of breath with ADL's             ITP Comments: Pt is making expected progress toward Pulmonary Rehab goals after completing 22 session(s). Recommend continued exercise, life style modification, education, and utilization of breathing techniques to increase stamina and strength, while also decreasing shortness of breath with exertion.  Dr. Mechele Collin is Medical Director for Pulmonary Rehab at [facility].

## 2023-06-12 ENCOUNTER — Encounter (HOSPITAL_COMMUNITY): Payer: No Typology Code available for payment source

## 2023-06-17 ENCOUNTER — Encounter (HOSPITAL_COMMUNITY)
Admission: RE | Admit: 2023-06-17 | Discharge: 2023-06-17 | Disposition: A | Discharge status: Home / Self Care | Payer: No Typology Code available for payment source | Source: Ambulatory Visit | Attending: Pulmonary Disease | Admitting: Pulmonary Disease

## 2023-06-17 DIAGNOSIS — J449 Chronic obstructive pulmonary disease, unspecified: Secondary | ICD-10-CM | POA: Diagnosis not present

## 2023-06-17 NOTE — Progress Notes (Signed)
 Daily Session Note  Patient Details  Name: Arthur Harper MRN: [id number] Date of Birth: 03-Jun-1945 Referring Provider:   Doristine Devoid Pulmonary Rehab Walk Test from 03/14/2023 in [facility]  Referring Provider [name]  [[name]]       Encounter Date: 06/17/2023  Check In:  Session Check In - 06/17/23 5621       Check-In   Supervising physician immediately available to respond to emergencies [facility] MD immediately available    Physician(s) Robin Searing, NP    Location [facility]    Staff Present Essie Hart, RN, BSN;Jahyra Sukup Charlean Sanfilippo, MS, ACSM-CEP, Exercise Physiologist    Virtual Visit No    Medication changes reported     No    Fall or balance concerns reported    No    Tobacco Cessation No Change    Warm-up and Cool-down Performed as group-led instruction    Resistance Training Performed Yes    VAD Patient? No    PAD/SET Patient? No      Pain Assessment   Currently in Pain? No/denies    Multiple Pain Sites No             Capillary Blood Glucose: No results found for this or any previous visit (from the past 24 hours).    Social History   Tobacco Use  Smoking Status Former   Current packs/day: 0.00   Average packs/day: 1 pack/day for 40.0 years (40.0 ttl pk-yrs)   Types: Cigarettes   Start date: 12/31/1968   Quit date: 12/31/2008   Years since quitting: 14.4  Smokeless Tobacco Never  Tobacco Comments   10 cigs per day    Goals Met:  Proper associated with RPD/PD & O2 Sat Independence with exercise equipment Exercise tolerated well No report of concerns or symptoms today Strength training completed today  Goals Unmet:  Not Applicable  Comments: Service time is from 1014 to 1149.    Dr. Mechele Collin is Medical Director for Pulmonary Rehab at [facility].

## 2023-06-19 ENCOUNTER — Encounter (HOSPITAL_COMMUNITY)
Admission: RE | Admit: 2023-06-19 | Discharge: 2023-06-19 | Disposition: A | Discharge status: Home / Self Care | Payer: No Typology Code available for payment source | Source: Ambulatory Visit | Attending: Pulmonary Disease | Admitting: Pulmonary Disease

## 2023-06-19 DIAGNOSIS — J449 Chronic obstructive pulmonary disease, unspecified: Secondary | ICD-10-CM | POA: Diagnosis not present

## 2023-06-19 NOTE — Progress Notes (Signed)
 Daily Session Note  Patient Details  Name: Arthur Harper MRN: [id number] Date of Birth: 19-Mar-1946 Referring Provider:   Doristine Devoid Pulmonary Rehab Walk Test from 03/14/2023 in [facility]  Referring Provider [name]  [[name]]       Encounter Date: 06/19/2023  Check In:  Session Check In - 06/19/23 1038       Check-In   Supervising physician immediately available to respond to emergencies [facility] MD immediately available    Physician(s) Bernadene Person, NP    Staff Present Essie Hart, RN, BSN;Idus Rathke, Zella Richer, MS, ACSM-CEP, Exercise Physiologist;Randi Dionisio Paschal, ACSM-CEP, Exercise Physiologist;Samantha Belarus, [place], LDN;Jetta Walker BS, ACSM-CEP, Exercise Physiologist    Virtual Visit No    Medication changes reported     No    Fall or balance concerns reported    No    Tobacco Cessation No Change    Warm-up and Cool-down Performed as group-led instruction    Resistance Training Performed Yes    VAD Patient? No    PAD/SET Patient? No      Pain Assessment   Currently in Pain? No/denies             Capillary Blood Glucose: No results found for this or any previous visit (from the past 24 hours).    Social History   Tobacco Use  Smoking Status Former   Current packs/day: 0.00   Average packs/day: 1 pack/day for 40.0 years (40.0 ttl pk-yrs)   Types: Cigarettes   Start date: 12/31/1968   Quit date: 12/31/2008   Years since quitting: 14.4  Smokeless Tobacco Never  Tobacco Comments   10 cigs per day    Goals Met:  Proper associated with RPD/PD & O2 Sat Independence with exercise equipment Exercise tolerated well No report of concerns or symptoms today Strength training completed today  Goals Unmet:  Not Applicable  Comments: Service time is from 1015 to 1142.    Dr. Mechele Collin is Medical Director for Pulmonary Rehab at [facility].

## 2023-06-20 NOTE — Progress Notes (Signed)
 Discharge Progress Report  Patient Details  Name: Arthur Harper MRN: [id number] Date of Birth: [birth year] Referring Provider:   Doristine Devoid Pulmonary Rehab Walk Test from 03/14/2023 in [facility]  Referring Provider [name]  [[name]]        Number of Visits: 24  Reason for Discharge:  Patient reached a stable level of exercise. Patient independent in their exercise. Patient has met program and personal goals.  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)  ADL UCSD:  Pulmonary Assessment Scores     Row Name 03/14/23 1050 06/03/23 1515       ADL UCSD   ADL Phase Entry Exit    SOB Score total 64 43      CAT Score   CAT Score 26 23      mMRC Score   mMRC Score 2 --             Initial Exercise Prescription:  Initial Exercise Prescription - 03/14/23 1100       Date of Initial Exercise RX and Referring Provider   Date 03/14/23    Referring Provider Vivia Budge   Expected Discharge Date 06/04/22      Treadmill   MPH 1.8    Grade 0    Minutes 15      Recumbant Elliptical   Level 2    RPM 60    Watts 40    Minutes 15      Prescription Details   Frequency (times per week) 2    Duration Progress to 30 minutes of continuous aerobic without signs/symptoms of physical distress      Intensity   THRR 40-80% of Max Heartrate 57-114    Ratings of Perceived Exertion 11-13    Perceived Dyspnea 0-4      Progression   Progression Continue progressive overload as per policy without signs/symptoms or physical distress.      Resistance Training    Training Prescription Yes    Weight blue bands    Reps 10-15             Discharge Exercise Prescription (Final Exercise Prescription Changes):  Exercise Prescription Changes - 06/10/23 1100       Response to Exercise   Blood Pressure (Admit) 128/64    Blood Pressure (Exit) 100/64    Heart Rate (Admit) 62 bpm    Heart Rate (Exercise) 82 bpm    Heart Rate (Exit) 67 bpm    Oxygen Saturation (Admit) 97 %    Oxygen Saturation (Exercise) 94 %    Oxygen Saturation (Exit) 96 %    Rating of Perceived Exertion (Exercise) 12    Perceived Dyspnea (Exercise) 2    Duration Continue with 30 min of aerobic exercise without signs/symptoms of physical distress.    Intensity THRR unchanged      Progression   Progression Continue to progress workloads to maintain intensity without signs/symptoms of physical distress.      Resistance Training   Training Prescription Yes    Weight black bands  Reps 10-15    Time 10 Minutes      Recumbant Elliptical   Level 6    Minutes 15    METs 3             Functional Capacity:  6 Minute Walk     Row Name 03/14/23 1111 06/10/23 1208       6 Minute Walk   Phase Initial Discharge    Distance 1280 feet 1610 feet    Distance % Change -- 25.78 %    Distance Feet Change -- 330 ft    Walk Time 6 minutes 6 minutes    # of Rest Breaks 0 0    MPH 2.42 1.22    METS 10.95 2.7    RPE 12 12    Perceived Dyspnea  3 2    VO2 Peak 6.84 9.46    Symptoms No No    Resting HR 61 bpm 64 bpm    Resting BP 142/70 128/64    Resting Oxygen Saturation  95 % 98 %    Exercise Oxygen Saturation  during 6 min walk 89 % 93 %    Max Ex. HR 79 bpm 93 bpm    Max Ex. BP 152/68 160/70    2 Minute Post BP 128/70 132/70      Interval HR   1 Minute HR 63 75    2 Minute HR 63 86    3 Minute HR 65 88    4 Minute HR 63 96    5 Minute HR 63 92    6 Minute HR 79 93    2 Minute Post HR 64 78    Interval Heart Rate? Yes Yes      Interval Oxygen   Interval  Oxygen? Yes Yes    Baseline Oxygen Saturation % 95 % 98 %    1 Minute Oxygen Saturation % 93 % 98 %    1 Minute Liters of Oxygen 0 L 0 L    2 Minute Oxygen Saturation % 89 % 94 %    2 Minute Liters of Oxygen 0 L 0 L    3 Minute Oxygen Saturation % 90 % 93 %    3 Minute Liters of Oxygen 0 L 0 L    4 Minute Oxygen Saturation % 90 % 93 %    4 Minute Liters of Oxygen 0 L 0 L    5 Minute Oxygen Saturation % 91 % 93 %    5 Minute Liters of Oxygen 0 L 0 L    6 Minute Oxygen Saturation % 90 % 93 %    6 Minute Liters of Oxygen 0 L 0 L    2 Minute Post Oxygen Saturation % 95 % 96 %    2 Minute Post Liters of Oxygen 0 L 0 L             Psychological, QOL, Others - Outcomes: PHQ 2/9:    06/17/2023    3:24 PM 04/29/2023   10:43 AM 03/14/2023   10:49 AM 12/10/2022    1:22 PM 12/10/2022    1:21 PM  Depression screen PHQ 2/9  Decreased Interest 0 1 0 0 0  Down, Depressed, Hopeless 0 0 1 0 0  PHQ - 2 Score 0 1 1 0 0  Altered sleeping 0 0 1 0 0  Tired, decreased energy 0 0 1 0 0  Change in appetite 0 0 1 0 0  Feeling bad  or failure about yourself  0 0 0 0 0  Trouble concentrating 0 0 0 0 0  Moving slowly or fidgety/restless 0 0 0 0 0  Suicidal thoughts 0 0 0 0 0  PHQ-9 Score 0 1 4 0 0  Difficult doing work/chores Not difficult at all Not difficult at all Not difficult at all Not difficult at all Not difficult at all    Quality of Life:   Personal Goals: Goals established at orientation with interventions provided to work toward goal.  Personal Goals and Risk Factors at Admission - 03/14/23 1027       Core Components/Risk Factors/Patient Goals on Admission   Improve shortness of breath with ADL's Yes    Intervention Provide education, individualized exercise plan and daily activity instruction to help decrease symptoms of SOB with activities of daily living.    Expected Outcomes Short Term: Improve cardiorespiratory fitness to achieve a reduction of symptoms when performing  ADLs;Long Term: Be able to perform more ADLs without symptoms or delay the onset of symptoms    Increase knowledge of respiratory medications and ability to use respiratory devices properly  Yes    Intervention Provide education and demonstration as needed of appropriate use of medications, inhalers, and oxygen therapy.    Expected Outcomes Short Term: Achieves understanding of medications use. Understands that oxygen is a medication prescribed by physician. Demonstrates appropriate use of inhaler and oxygen therapy.;Long Term: Maintain appropriate use of medications, inhalers, and oxygen therapy.              Personal Goals Discharge:  Goals and Risk Factor Review     Row Name 03/17/23 0947 04/07/23 1318 05/05/23 0913 06/02/23 1402       Core Components/Risk Factors/Patient Goals Review   Personal Goals Review Improve shortness of breath with ADL's;Develop more efficient breathing techniques such as purse lipped breathing and diaphragmatic breathing and practicing self-pacing with activity.;Increase knowledge of respiratory medications and ability to use respiratory devices properly. Improve shortness of breath with ADL's;Develop more efficient breathing techniques such as purse lipped breathing and diaphragmatic breathing and practicing self-pacing with activity.;Increase knowledge of respiratory medications and ability to use respiratory devices properly. Improve shortness of breath with ADL's;Develop more efficient breathing techniques such as purse lipped breathing and diaphragmatic breathing and practicing self-pacing with activity. Improve shortness of breath with ADL's    Review Jerric is scheduled to start PR on 03/18/23. Goal progressing for improve shortness of breath with ADL's. Goal progressing for developing more efficient breathing techniques such as purse lipped breathing and diaphragmatic breathing; and practicing self-pacing with activity. Goal progressing for increasing  knowledge of respiratory medications and ability to use respiratory devices properly. Goal progressing for improve shortness of breath with ADL's. Anikin remains on RA while exercising with sats >88%. Goal progressing for developing more efficient breathing techniques such as purse lipped breathing and diaphragmatic breathing; and practicing self-pacing with activity. Goal met for increasing knowledge of respiratory medications and ability to use respiratory devices properly. Brodie is able to demonstrate proper technique with MDI to staff. We will continue to monitor Hensley's progress throughout the program. Goal progressing for improve shortness of breath with ADL's. Corran remains on RA while exercising with sats >88%. Goal met for developing more efficient breathing techniques such as purse lipped breathing and diaphragmatic breathing; and practicing self-pacing with activity. Magdiel demonstrates purse lip breathing when he gets short of breath. He also knows how to pace himself based on the RPE scale. We  will continue to monitor Nana's progress throughout the program. Goal progressing for improving shortness of breath with ADL's. Creg remains on RA while exercising with sats >88%. Tunis is currently exercising on the treadmill and the recumbant elliptical. Cloyd looks forward to coming to class every week. He is a pleasure to have in class. We will continue to monitor Steaven's progress throughout the program.    Expected Outcomes For Nettie to improve shortness of breath with ADL's, develop more efficient breathing techniques such as purse lipped breathing and diaphragmatic breathing; and practicing self-pacing with activity and increase knowledge of respiratory medications and ability to use respiratory devices properly. For Romeo Apple to improve shortness of breath with ADL's and develop more efficient breathing techniques such as purse lipped breathing and diaphragmatic breathing;  and practicing self-pacing with activity. For Romeo Apple to improve shortness of breath with ADL's For Romeo Apple to improve shortness of breath with ADL's             Exercise Goals and Review:  Exercise Goals     Row Name 03/14/23 1029             Exercise Goals   Increase Physical Activity Yes       Intervention Provide advice, education, support and counseling about physical activity/exercise needs.;Develop an individualized exercise prescription for aerobic and resistive training based on initial evaluation findings, risk stratification, comorbidities and participant's personal goals.       Expected Outcomes Short Term: Attend rehab on a regular basis to increase amount of physical activity.;Long Term: Add in home exercise to make exercise part of routine and to increase amount of physical activity.;Long Term: Exercising regularly at least 3-5 days a week.       Increase Strength and Stamina Yes       Intervention Provide advice, education, support and counseling about physical activity/exercise needs.;Develop an individualized exercise prescription for aerobic and resistive training based on initial evaluation findings, risk stratification, comorbidities and participant's personal goals.       Expected Outcomes Short Term: Increase workloads from initial exercise prescription for resistance, speed, and METs.;Short Term: Perform resistance training exercises routinely during rehab and add in resistance training at home;Long Term: Improve cardiorespiratory fitness, muscular endurance and strength as measured by increased METs and functional capacity ( )       Able to understand and use rate of perceived exertion (RPE) scale Yes       Intervention Provide education and explanation on how to use RPE scale       Expected Outcomes Short Term: Able to use RPE daily in rehab to express subjective intensity level;Long Term:  Able to use RPE to guide intensity level when exercising independently        Able to understand and use Dyspnea scale Yes       Intervention Provide education and explanation on how to use Dyspnea scale       Expected Outcomes Short Term: Able to use Dyspnea scale daily in rehab to express subjective sense of shortness of breath during exertion;Long Term: Able to use Dyspnea scale to guide intensity level when exercising independently       Knowledge and understanding of Target Heart Rate Range (THRR) Yes       Intervention Provide education and explanation of THRR including how the numbers were predicted and where they are located for reference       Expected Outcomes Short Term: Able to state/look up THRR;Long Term: Able to use THRR to govern  intensity when exercising independently;Short Term: Able to use daily as guideline for intensity in rehab       Understanding of Exercise Prescription Yes       Intervention Provide education, explanation, and written materials on patient's individual exercise prescription       Expected Outcomes Short Term: Able to explain program exercise prescription;Long Term: Able to explain home exercise prescription to exercise independently                Exercise Goals Re-Evaluation:  Exercise Goals Re-Evaluation     Row Name 03/14/23 1113 04/07/23 0727 05/09/23 1320 06/04/23 0854 06/19/23 1553     Exercise Goal Re-Evaluation   Exercise Goals Review Increase Physical Activity;Able to understand and use Dyspnea scale;Understanding of Exercise Prescription;Increase Strength and Stamina;Knowledge and understanding of Target Heart Rate Range (THRR);Able to understand and use rate of perceived exertion (RPE) scale Increase Physical Activity;Able to understand and use Dyspnea scale;Understanding of Exercise Prescription;Increase Strength and Stamina;Knowledge and understanding of Target Heart Rate Range (THRR);Able to understand and use rate of perceived exertion (RPE) scale Increase Physical Activity;Able to understand and use Dyspnea  scale;Understanding of Exercise Prescription;Increase Strength and Stamina;Knowledge and understanding of Target Heart Rate Range (THRR);Able to understand and use rate of perceived exertion (RPE) scale Increase Physical Activity;Able to understand and use Dyspnea scale;Understanding of Exercise Prescription;Increase Strength and Stamina;Knowledge and understanding of Target Heart Rate Range (THRR);Able to understand and use rate of perceived exertion (RPE) scale Increase Physical Activity;Able to understand and use Dyspnea scale;Understanding of Exercise Prescription;Increase Strength and Stamina;Knowledge and understanding of Target Heart Rate Range (THRR);Able to understand and use rate of perceived exertion (RPE) scale   Comments Tiburcio Pea is scheduled to exercise 11/26. Will continue to monitor and progress as able. Tiburcio Pea has completed 5 exercise sessions. He exercises for 15 min on the treadmill and recumbent elliptical. He averages 2.8 METs at 2.2 mph and 1% on the treadmill and 3.7 METs at level 3 on the recumbent elliptical. He performs the warmup and cooldown standing without limitations. Tiburcio Pea has increased his workloads for both exercise modes as METs have significantly increased. Harris tolerates progressions well. We have not discussed home exercise yet as Tiburcio Pea is the next patient I am planning to talk to. Will conitnue to monitor and progress as able. Tiburcio Pea has completed 15 exercise sessions. He exercises for 15 min on the treadmill and recumbent elliptical. He averages 3.4 METs at 2.5 mph and 2% on the treadmill and 2.6 METs at level 5 on the recumbent elliptical. He performs the warmup and cooldown standing without limitations. Tiburcio Pea has increased his speed and incline on the tredmill as METs have significantly increased. He has increased his level on the recumbent elliptical several times although METs have remained relatively the same. I have recently briefly discussed home exercise. I plan to  finish this conversation next session. Will continue to monitor and progress as able. Tiburcio Pea has completed 20 exercise sessions. He exercises for 15 min on the treadmill and recumbent elliptical. He averages 3.4 METs at 2.5 mph and 2% on the treadmill and 2.6 METs at level 5 on the recumbent elliptical. He performs the warmup and cooldown standing without limitations. Tiburcio Pea has not progressed since last month. There is a possibility he is reaching his maximum functional capacity. I have discussed home exercise with Harris as I encouraged him to start exercising at his local [employer]. Will continue to monitor and progress as able. Nathaneal has completed 24 exercise session. His  peak METs were 3.7 on the treadmill and 3.0 on the recumbent elliptical. Romeo Apple plans to continue exercise at the [facility].   Expected Outcomes Through exercise at rehab and home, the patient will decrease shortness of breath with daily activities and feel confident in carrying out an exercise regimen at home. Through exercise at rehab and home, the patient will decrease shortness of breath with daily activities and feel confident in carrying out an exercise regimen at home. Through exercise at rehab and home, the patient will decrease shortness of breath with daily activities and feel confident in carrying out an exercise regimen at home. Through exercise at rehab and home, the patient will decrease shortness of breath with daily activities and feel confident in carrying out an exercise regimen at home. Through exercise at rehab and home, the patient will decrease shortness of breath with daily activities and feel confident in carrying out an exercise regimen at home.            Nutrition & Weight - Outcomes:  Pre Biometrics - 03/14/23 1138       Pre Biometrics   Grip Strength 32 kg              Nutrition:   Nutrition Discharge:   Education Questionnaire Score:  Knowledge Questionnaire Score - 06/03/23 1514        Knowledge Questionnaire Score   Post Score 17/18            Zain graduated the PR program on 06/19/23 completing 24 sessions. At time of discharge Jahn denied any new psychosocial barriers or concerns at this time. His depression is stable at this time and he is compliant with taking his psychotropic meds and meeting with his therapist.  Post Graduation Core components/risk factors/patient goals re-evaluate are as follows: Goal met for improving his shortness of breath with ADL's. Pradeep remained on room air while exercising with sats >88% and increasing both his METs and workload. Timotheus is currently exercising on the treadmill and the recumbent elliptical. He has worked hard throughout the program, and we are proud of the success he made while he was here.    Goals reviewed with patient; copy given to patient.

## 2023-08-19 DIAGNOSIS — J441 Chronic obstructive pulmonary disease with (acute) exacerbation: Secondary | ICD-10-CM | POA: Diagnosis not present

## 2023-08-20 DIAGNOSIS — J441 Chronic obstructive pulmonary disease with (acute) exacerbation: Secondary | ICD-10-CM | POA: Diagnosis not present

## 2023-08-21 DIAGNOSIS — J441 Chronic obstructive pulmonary disease with (acute) exacerbation: Secondary | ICD-10-CM | POA: Diagnosis not present

## 2023-08-21 DIAGNOSIS — G4733 Obstructive sleep apnea (adult) (pediatric): Secondary | ICD-10-CM | POA: Diagnosis not present

## 2023-08-22 DIAGNOSIS — J441 Chronic obstructive pulmonary disease with (acute) exacerbation: Secondary | ICD-10-CM | POA: Diagnosis not present

## 2023-08-23 DIAGNOSIS — J441 Chronic obstructive pulmonary disease with (acute) exacerbation: Secondary | ICD-10-CM | POA: Diagnosis not present

## 2023-08-24 DIAGNOSIS — J441 Chronic obstructive pulmonary disease with (acute) exacerbation: Secondary | ICD-10-CM | POA: Diagnosis not present

## 2023-08-25 DIAGNOSIS — J441 Chronic obstructive pulmonary disease with (acute) exacerbation: Secondary | ICD-10-CM | POA: Diagnosis not present

## 2023-08-26 DIAGNOSIS — J441 Chronic obstructive pulmonary disease with (acute) exacerbation: Secondary | ICD-10-CM | POA: Diagnosis not present

## 2023-08-27 DIAGNOSIS — J441 Chronic obstructive pulmonary disease with (acute) exacerbation: Secondary | ICD-10-CM | POA: Diagnosis not present

## 2023-08-29 ENCOUNTER — Telehealth: Payer: Self-pay

## 2023-08-29 NOTE — Transitions of Care (Post Inpatient/ED Visit) (Signed)
   08/29/2023  Name: Arthur Harper MRN: [id number] DOB: [birth year]  Today's TOC FU Call Status: Today's TOC FU Call Status:: Unsuccessful Call (1st Attempt) Unsuccessful Call (1st Attempt) Date: 08/29/23  Attempted to reach the patient regarding the most recent Inpatient/ED visit.  Follow Up Plan: Additional outreach attempts will be made to reach the patient to complete the Transitions of Care (Post Inpatient/ED visit) call.   Orpha Blade, RN, BSN, CEN [employer]- Transition of Care Team.  [facility] [phone]

## 2023-09-01 ENCOUNTER — Telehealth: Payer: Self-pay

## 2023-09-01 NOTE — Transitions of Care (Post Inpatient/ED Visit) (Signed)
   09/01/2023  Name: Arthur Harper MRN: [id number] DOB: 06/09/1945  Today's TOC FU Call Status: Today's TOC FU Call Status:: Unsuccessful Call (2nd Attempt) Unsuccessful Call (2nd Attempt) Date: 09/01/23  Attempted to reach the patient regarding the most recent Inpatient/ED visit.  Follow Up Plan: Additional outreach attempts will be made to reach the patient to complete the Transitions of Care (Post Inpatient/ED visit) call.    Orpha Blade, RN, BSN, CEN [employer]- Transition of Care Team.  [facility] [phone]

## 2023-09-02 ENCOUNTER — Telehealth: Payer: Self-pay

## 2023-09-02 NOTE — Transitions of Care (Post Inpatient/ED Visit) (Signed)
   09/02/2023  Name: Arthur Harper MRN: [id number] DOB: 1946/02/22  Today's TOC FU Call Status: Today's TOC FU Call Status:: Successful TOC FU Call Completed TOC FU Call Complete Date: 09/02/23 Patient's Name and Date of Birth confirmed.  Transition Care Management Follow-up Telephone Call How have you been since you were released from the hospital?: Better (Patient reports that he is doing well. Reports that he does not need to go over his discharge instructions because he has been out of the hospital for a while. I voiced understanding.)  Items Reviewed: Patient did have a question about a shower chair. I reviewed with patient to call his insurance carrier to see if they cover this equipment.  If not reviewed with patient that he can get at a local medical supply store with an MD order. Patient voiced understanding. I encouraged patient to call me if he has any additional needs.  Orpha Blade, RN, BSN, CEN [employer]- Transition of Care Team.  [facility] [phone]

## 2023-09-04 ENCOUNTER — Encounter: Payer: Self-pay | Admitting: Family Medicine

## 2023-09-04 ENCOUNTER — Ambulatory Visit (INDEPENDENT_AMBULATORY_CARE_PROVIDER_SITE_OTHER): Admitting: Family Medicine

## 2023-09-04 VITALS — BP 115/69 | HR 84 | Ht 69.0 in | Wt 232.0 lb

## 2023-09-04 DIAGNOSIS — I10 Essential (primary) hypertension: Secondary | ICD-10-CM | POA: Diagnosis not present

## 2023-09-04 DIAGNOSIS — J449 Chronic obstructive pulmonary disease, unspecified: Secondary | ICD-10-CM | POA: Diagnosis not present

## 2023-09-04 NOTE — Assessment & Plan Note (Addendum)
 Continue Stiolto, albuterol  as needed.  Takes daily azithromycin  250 mg.  Currently on 2 L supplemental oxygen since hospital discharge.  Pulse ox levels generally in the 90s, none less than 90 so far.  Sees pulmonology in the next few weeks to determine if he still needs supplemental oxygen.

## 2023-09-04 NOTE — Patient Instructions (Signed)
 It was nice to see you today,  We addressed the following topics today: -No changes need to be made today to your medications. - I have included the amlodipine in your records. - If you want to try and titrate down your oxygen, make sure you check your oxygen level.  Your oxygen level should stay in the 90s.  If it goes down into the 80s whether you are walking or sitting you should increase the oxygen amount.  Have a great day,  Etha Henle, MD

## 2023-09-04 NOTE — Progress Notes (Signed)
   Established Patient Office Visit  Subjective   Patient ID: Arthur Harper, male    DOB: 12-20-1945  Age: 78 y.o. MRN: [id number]  Chief Complaint  Patient presents with   Hospitalization Follow-up    HPI  Subjective - Recently hospitalized for COPD exacerbation with viral respiratory infection - Diagnosed with pneumo virus and rhino virus - Discharged on 08/28/2023.  Has seen the [place] provider since that time. - Reports occasional lightheadedness with walking - Currently on 2L oxygen - O2 saturation ranges 90-97% with oxygen, 91% without oxygen - No readings in 80s recently - Denies current steroid or antibiotic use - Started on amlodipine 5 mg in the hospital.  Taking this medication checking his blood pressure while on it. - Has been taking azithromycin  daily is 250 mg prescribed by the pulmonologist.  Medications Amlodipine 5mg  daily (new, started in hospital for blood pressure), Atorvastatin , baby aspirin, Lipitor, Zyrtec, Trazodone , Zithromax  (maintenance for COPD), Stiolto daily inhaler, Breo Ellipta daily inhaler, nebulizer treatments (every 6 hours but trying to reduce frequency).  PMH, PSH, FH, Social Hx COPD, hypertension (new diagnosis during recent hospitalization), hyperlipidemia with 30-35% blockage per previous cardiology evaluation with Dr. Suzann Ernst.  ROS Respiratory: occasional lightheadedness with exertion. Denies O2 sats in 80s.    The 10-year ASCVD risk score (Arnett DK, et al., 2019) is: 22.4%  Health Maintenance Due  Topic Date Due   COVID-19 Vaccine (1) Never done      Objective:     BP 115/69   Pulse 84   Ht 5\' 9"  (1.753 m)   Wt 232 lb 0.6 oz (105.3 kg)   SpO2 95%   BMI 34.27 kg/m    Physical Exam General: Alert, oriented CV: Regular rhythm Pulmonary: Lungs clear bilaterally, no respiratory distress.  On 2 L nasal cannula.   No results found for any visits on 09/04/23.      Assessment & Plan:   COPD  GOLD 2  Assessment  & Plan: Continue Stiolto, albuterol  as needed.  Takes daily azithromycin  250 mg.  Currently on 2 L supplemental oxygen since hospital discharge.  Pulse ox levels generally in the 90s, none less than 90 so far.  Sees pulmonology in the next few weeks to determine if he still needs supplemental oxygen.   Hypertension, unspecified type Assessment & Plan: The patient was started on amlodipine 5 mg after hospitalization.  Continues to take it and record his blood pressure values.  No side effects noted so far.  Patient did not have any issues with hypertension prior to hospitalization.      Return for already has appt.    Laneta Pintos, MD

## 2023-09-04 NOTE — Assessment & Plan Note (Signed)
 The patient was started on amlodipine 5 mg after hospitalization.  Continues to take it and record his blood pressure values.  No side effects noted so far.  Patient did not have any issues with hypertension prior to hospitalization.

## 2023-10-27 ENCOUNTER — Other Ambulatory Visit: Payer: Self-pay | Admitting: Family Medicine

## 2023-10-27 DIAGNOSIS — F409 Phobic anxiety disorder, unspecified: Secondary | ICD-10-CM

## 2023-10-27 NOTE — Progress Notes (Unsigned)
 Arthur Harper, male    DOB: Jun 24, 1945,    MRN: [id number]   Brief patient profile:  68 yowm MM/quit smoking 2010 @ wt 180  referred to pulmonary clinic [facility]/08/[facility] with GOLD 2 criteria in 05/2010.       History of Present Illness  02/08/2021  Pulmonary/ 1st office eval/Arthur Harper  Chief Complaint  Patient presents with   Consult    Referred by [facility]. Corrie from [facility]  Dyspnea:  has [place] stops every 5-10 min no routine  Cough: tickle in throat for more than a year or two, min clear  mucus prod/ slt hoarse Sleep: cpap per Clance  SABA use: once or twice a month hfa/ no neb  Rec Plan A = Automatic = Always=  Symbicort   Take 2 puffs first thing in am and then another 2 puffs about 12 hours later and spiriva  2 puffs in am  Work on inhaler technique:    Plan B = Backup (to supplement plan A, not to replace it) Only use your albuterol  inhaler as a rescue medication  Ok to try albuterol  15 min before an activity (on alternating days)  that you know would usually make you short of breath         02/05/2022  f/u ov/Arthur Harper re: GOLD 2 COPD    maint on Breztri    Chief Complaint  Patient presents with   Follow-up    Pt states he has a a severe pain in the right backside of his head that started x 2 nights ago.  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   Plan B = Backup (to supplement plan A, not to replace it) Only use your albuterol  inhaler as a rescue medication Advil 600 mg three times a day with meals for up to a week       02/18/2023  12 m  f/u ov/Arthur Harper re: GOLD 2 COPD   maint on stiolto/singulair    Chief Complaint  Patient presents with   Follow-up  Dyspnea:  very little walking/ yard work = mostly riding [employer] though doing some weed eating  Cough: shrill/ very harsh assoc with   globus sensation > no production  Sleeping: cpap flat bed one pillow  s  resp cc  SABA use: rarely  02: none  [facility] :  [facility]  Rec Try prilosec (omeprazole ) 40mg   Take 30-60 min before first meal of the day and Pepcid  ac (famotidine ) 20 mg one @  bedtime until you [facility] - take all meds / inhalers with you GERD diet reviewed, bed blocks rec   Also  Ok to try albuterol  15 min before an activity (on alternating days like starter fluid)   D/C Aug 28 2023 from ? [facility] on 2lpm    10/29/2023  f/u ov/Arthur Harper re: GOLD 2 COPD   maint on [facility]IP[facility] RX [facility]  [facility] ( Chief Complaint  Patient presents with  Follow-up    Pt was in the hospital for 8 days in May due to respiratory issues.  SOB worse with exertion.     [place]ysp[place]:  [place] 90% RA Cough: none  Sleeping: on cpap flat bed/ wedge pillow s  resp cc  SABA use: sev times a day  02: not using   Lung cancer screening :  q January     No obvious day to day or daytime variability or assoc excess/ purulent sputum or mucus plugs or hemoptysis or cp or chest tightness, subjective wheeze or overt  hb symptoms.    Also denies any obvious fluctuation of symptoms with weather or environmental changes or other aggravating or alleviating factors except as outlined above   No unusual exposure hx or h/o childhood pna/ asthma or knowledge of premature birth.  Current Allergies, Complete Past Medical History, Past Surgical History, Family History, and Social History were reviewed in [employer] record.  ROS  The following are not active complaints unless bolded Hoarseness, sore throat, dysphagia,  dental problems, itching, sneezing,  nasal congestion or discharge of excess mucus or purulent secretions, ear ache,   fever, chills, sweats, unintended wt loss or wt gain, classically pleuritic or exertional cp,  orthopnea pnd or arm/hand swelling  or leg swelling, presyncope, palpitations, abdominal pain, anorexia, nausea, vomiting, diarrhea  or change in bowel habits or change in bladder habits, change in stools or change in urine, dysuria, hematuria,  rash, arthralgias, visual complaints, headache, numbness, weakness or ataxia or problems with walking or coordination,  change in mood or  memory.        Current Meds  Medication Sig   albuterol  (VENTOLIN  HFA) 108 (90 Base) MCG/ACT inhaler Inhale 2 puffs into the lungs every 6 (six) hours as needed.   amLODipine (NORVASC) 5 MG tablet Take 5 mg by mouth daily.   ammonium lactate (LAC-HYDRIN) 12 % lotion Apply 1 Application topically as needed for dry skin.   aspirin 81 MG chewable tablet Chew 81 mg by mouth daily.   atorvastatin  (LIPITOR) 20 MG tablet Take 20 mg by mouth daily.   azithromycin  (ZITHROMAX ) 250 MG tablet Take 250 mg by mouth.   budesonide  (RHINOCORT  AQUA) 32 MCG/ACT nasal spray Place 64 mcg into the nose.   carboxymethylcellulose (REFRESH PLUS) 0.5 % SOLN INSTILL 1 DROP IN BOTH EYES FOUR TIMES A DAY AS NEEDED   cetirizine (ZYRTEC) 10 MG tablet Take by mouth.   citalopram  (CELEXA ) 10 MG tablet Take 1 tablet (10 mg total) by mouth daily.   diclofenac Sodium (VOLTAREN) 1 % GEL Apply 2 g topically 4 (four) times daily.   famotidine  (PEPCID ) 20 MG tablet One after supper   fluticasone  (FLONASE ) 50 MCG/ACT nasal spray Place 1-2 sprays into both nostrils daily.   hydrocortisone 2.5 % cream APPLY SMALL AMOUNT TO AFFECTED AREA TWICE A DAY AS NEEDED -MIX WITH KETOCONAZOLE CREAM AND APPLY TO THE RED AREAS UNDER ARMPITS AND  IN THE GROIN TWICE DAILY AS NEEDED -MIX WITH KETOCONAZOLE CREAM AND APPLY TO THE RED AREAS UNDER ARMPITS AND   IN THE GROIN  TWICE DAILY AS NEEDED   ibuprofen (ADVIL) 600 MG tablet Take 600 mg by mouth every 6 (six) hours as needed.   ketoconazole (NIZORAL) 2 % cream APPLY SMALL AMOUNT TO AFFECTED AREA TWICE A DAY -MIX WITH HYDROCORTISONE CREAM AND APPLY TO THE RED AREAS IN THE GROIN AND  ARMPIT TWICE DAILY  AS NEEDED -MIX WITH HYDROCORTISONE CREAM AND APPLY TO THE RED AREAS IN THE GROIN AND  ARMPIT TWICE DAILY AS NEEDED   latanoprost (XALATAN) 0.005 % ophthalmic solution 1 drop at bedtime.   montelukast  (SINGULAIR ) 10 MG tablet Take 1 tablet (10 mg total) by mouth at bedtime.   Multiple Vitamin (MULTIVITAMIN) capsule Take 1 capsule by mouth daily.   omeprazole  (PRILOSEC) 40 MG capsule Take 30-60 min before first meal of the day   terazosin (HYTRIN) 5 MG capsule Take 5 mg by mouth at bedtime.   Tiotropium Bromide-Olodaterol (STIOLTO RESPIMAT) 2.5-2.5 MCG/ACT AERS Inhale 2 puffs into the lungs daily.   traZODone  (DESYREL ) 50 MG tablet TAKE 1 TABLET (50 MG TOTAL) BY MOUTH AT BEDTIME AS NEEDED FOR SLEEP.   triamcinolone  cream (KENALOG ) 0.5 % Apply 1 Application topically 3 (three) times daily.          Past Medical History:  Diagnosis Date   Agent orange exposure    COPD (chronic obstructive pulmonary disease) (HCC)    PTSD (post-traumatic stress disorder)    Sleep apnea      Objective:    wts   10/29/2023         234  02/18/2023     241   02/05/2022     220   06/20/21 221 lb (100.2 kg)  06/12/21 224 lb 6.4 oz (101.8 kg)  05/29/21 227 lb (103 kg)    Vital signs reviewed  10/29/2023  - Note at rest 02 sats  97% on RA   General appearance:    amb mod obese (by BMI) wm    HEENT : Oropharynx  clear       NECK :  without  apparent JVD/ palpable Nodes/TM    LUNGS: no acc muscle use,  Min barrel  contour chest wall with bilateral  slightly decreased bs s audible wheeze and  without cough on insp or exp maneuvers and min  Hyperresonant  to  percussion bilaterally    CV:  RRR  no s3 or murmur or increase in  P2, and no edema   ABD:  obese soft and nontender   MS:  Nl gait/ ext warm without deformities Or obvious joint restrictions  calf tenderness, cyanosis or clubbing     SKIN: warm and dry without lesions    NEURO:  alert, approp, nl sensorium with  no motor or cerebellar deficits apparent.           Assessment

## 2023-10-29 ENCOUNTER — Ambulatory Visit (INDEPENDENT_AMBULATORY_CARE_PROVIDER_SITE_OTHER): Admitting: Internal Medicine

## 2023-10-29 ENCOUNTER — Encounter: Payer: Self-pay | Admitting: Internal Medicine

## 2023-10-29 VITALS — BP 114/62 | HR 78 | Temp 98.2°F | Ht 69.0 in | Wt 234.0 lb

## 2023-10-29 DIAGNOSIS — J449 Chronic obstructive pulmonary disease, unspecified: Secondary | ICD-10-CM

## 2023-10-29 DIAGNOSIS — Z87891 Personal history of nicotine dependence: Secondary | ICD-10-CM

## 2023-10-29 NOTE — Patient Instructions (Signed)
 Blow mometasone out thru nose.  Please schedule a follow up visit in 6 months but call sooner if needed  with all respiratory  medications /inhalers/ solutions in hand so we can verify exactly what you are taking. This includes all medications from all doctors and over the counters

## 2023-10-30 ENCOUNTER — Encounter: Payer: Self-pay | Admitting: Internal Medicine

## 2023-10-30 NOTE — Assessment & Plan Note (Addendum)
 Quit smoking 2010/ MM - Spirometry 07/03/20  FEV1 1.94 (59%)  Ratio 0.50 p 30% improvement from saba   - 02/08/2021   Walked on RA x  3  lap(s) =  approx 750 @ nl pace, stopped due to end of study but sob p one lap with lowest 02 sats 95%  - 02/08/2021   Try symbicort /spiriva  - Labs ordered 02/08/2021  :  allergy profile  Eos 0.2/ IgE not done/ alpha one AT phenotype MM level 167  - PFT's  03/22/21 FEV1 1.9 (72 % ) ratio 0.57  p 13 % improvement from saba p symbicort /spiriva  prior to study with DLCO  Nl FV curve classic concavity  02/05/2022  After extensive coaching inhaler device,  effectiveness =   90% > continue breztri   - 02/18/2023 final pulmoanry ov/ changed to stiolto due to [place] restrictions with severe dry daytime cough > rec max gerd rx > [facility] - 10/29/2023  After extensive coaching inhaler device,  effectiveness =    90% HFA /SMI    Group D (now reclassified as E) in terms of symptom/risk and laba/lama/ICS  therefore appropriate rx at this point >>>  continue triple rx plus approp saba:  Re SABA :  I spent extra time with pt today reviewing appropriate use of albuterol  for prn use on exertion with the following points: 1) saba is for relief of sob that does not improve by walking a slower pace or resting but rather if the pt does not improve after trying this first. 2) If the pt is convinced, as many are, that saba helps recover from activity faster then it's easy to tell if this is the case by re-challenging : ie stop, take the inhaler, then p 5 minutes try the exact same activity (intensity of workload) that just caused the symptoms and see if they are substantially diminished or not after saba 3) if there is an activity that reproducibly causes the symptoms, try the saba 15 min before the activity on alternate days   If in fact the saba really does help, then fine to continue to use it prn but advised may need to look closer at the maintenance regimen(for [facility])  being used  to achieve better control of airways disease with exertion.   F/u q 6 m, sooner prn   Each maintenance medication was reviewed in detail including emphasizing most importantly the difference between maintenance and prns and under what circumstances the prns are to be triggered using an action plan format where appropriate.  Total time for H and P, chart review, counseling, reviewing hfa/ smi device(s) and generating customized AVS unique to this office visit / same day charting = 25 min

## 2023-11-03 ENCOUNTER — Ambulatory Visit: Payer: Medicare HMO | Admitting: Family Medicine

## 2023-11-08 ENCOUNTER — Other Ambulatory Visit: Payer: Self-pay | Admitting: Family Medicine

## 2023-11-08 ENCOUNTER — Other Ambulatory Visit: Payer: Self-pay | Admitting: Cardiology

## 2023-11-08 DIAGNOSIS — F411 Generalized anxiety disorder: Secondary | ICD-10-CM

## 2023-11-21 DIAGNOSIS — Z008 Encounter for other general examination: Secondary | ICD-10-CM | POA: Diagnosis not present

## 2023-12-11 ENCOUNTER — Ambulatory Visit (INDEPENDENT_AMBULATORY_CARE_PROVIDER_SITE_OTHER): Payer: Medicare HMO

## 2023-12-11 DIAGNOSIS — Z0000 Encounter for general adult medical examination without abnormal findings: Secondary | ICD-10-CM

## 2023-12-11 NOTE — Progress Notes (Signed)
 Subjective:   VALE MOUSSEAU is a 78 y.o. who presents for a Medicare Wellness preventive visit.  As a reminder, Annual Wellness Visits don't include a physical exam, and some assessments may be limited, especially if this visit is performed virtually. We may recommend an in-person follow-up visit with your provider if needed.  Visit Complete: Virtual I connected with  Margrette CHRISTELLA Pyo on 12/11/23 by a audio enabled telemedicine application and verified that I am speaking with the correct person using two identifiers.  Patient Location: Home  Provider Location: Home Office  I discussed the limitations of evaluation and management by telemedicine. The patient expressed understanding and agreed to proceed.  Vital Signs: Because this visit was a virtual/telehealth visit, some criteria may be missing or patient reported. Any vitals not documented were not able to be obtained and vitals that have been documented are patient reported.  VideoError- Librarian, academic were attempted between this provider and patient, however failed, due to patient having technical difficulties OR patient did not have access to video capability.  We continued and completed visit with audio only.   Persons Participating in Visit: Patient.  AWV Questionnaire: No: Patient Medicare AWV questionnaire was not completed prior to this visit.  Cardiac Risk Factors include: advanced age (>46men, >54 women);hypertension;male gender     Objective:    Today's Vitals   12/11/23 1616  PainSc: 7    There is no height or weight on file to calculate BMI.     12/11/2023    4:25 PM 03/14/2023   10:24 AM 12/10/2022    1:24 PM 05/15/2021    5:37 PM 04/26/2016    1:00 PM  Advanced Directives  Does Patient Have a Medical Advance Directive? Yes Yes Yes Yes No   Type of Estate agent of [place];Living will Living will;Healthcare Power of [employer] Power of  [place];Living will Living will;Healthcare Power of Attorney   Does patient want to make changes to medical advance directive?  No - Patient declined     Copy of Healthcare Power of Attorney in Chart? No - copy requested No - copy requested No - copy requested    Would patient like information on creating a medical advance directive?     Yes (MAU/Ambulatory/Procedural Areas - Information given)      Data saved with a previous flowsheet row definition    Current Medications (verified) Outpatient Encounter Medications as of 12/11/2023  Medication Sig   albuterol  (VENTOLIN  HFA) 108 (90 Base) MCG/ACT inhaler Inhale 2 puffs into the lungs every 6 (six) hours as needed.   amLODipine (NORVASC) 5 MG tablet Take 5 mg by mouth daily.   ammonium lactate (LAC-HYDRIN) 12 % lotion Apply 1 Application topically as needed for dry skin.   aspirin 81 MG chewable tablet Chew 81 mg by mouth daily.   atorvastatin  (LIPITOR) 20 MG tablet TAKE 1 TABLET (20 MG TOTAL) BY MOUTH DAILY.   azithromycin  (ZITHROMAX ) 250 MG tablet Take 250 mg by mouth.   budesonide  (RHINOCORT  AQUA) 32 MCG/ACT nasal spray Place 64 mcg into the nose.   carboxymethylcellulose (REFRESH PLUS) 0.5 % SOLN INSTILL 1 DROP IN BOTH EYES FOUR TIMES A DAY AS NEEDED   cetirizine (ZYRTEC) 10 MG tablet Take by mouth.   citalopram  (CELEXA ) 10 MG tablet TAKE 1 TABLET (10 MG TOTAL) BY MOUTH DAILY.   diclofenac Sodium (VOLTAREN) 1 % GEL Apply 2 g topically 4 (four) times daily.   famotidine  (PEPCID ) 20  MG tablet One after supper   fluticasone  (FLONASE ) 50 MCG/ACT nasal spray Place 1-2 sprays into both nostrils daily.   hydrocortisone 2.5 % cream APPLY SMALL AMOUNT TO AFFECTED AREA TWICE A DAY AS NEEDED -MIX WITH KETOCONAZOLE CREAM AND APPLY TO THE RED AREAS UNDER ARMPITS AND  IN THE GROIN TWICE DAILY AS NEEDED -MIX WITH KETOCONAZOLE CREAM AND APPLY TO THE RED AREAS UNDER ARMPITS AND   IN THE GROIN TWICE DAILY AS NEEDED   ibuprofen (ADVIL) 600 MG tablet Take  600 mg by mouth every 6 (six) hours as needed.   ketoconazole (NIZORAL) 2 % cream APPLY SMALL AMOUNT TO AFFECTED AREA TWICE A DAY -MIX WITH HYDROCORTISONE CREAM AND APPLY TO THE RED AREAS IN THE GROIN AND  ARMPIT TWICE DAILY AS NEEDED -MIX WITH HYDROCORTISONE CREAM AND APPLY TO THE RED AREAS IN THE GROIN AND  ARMPIT TWICE DAILY AS NEEDED   latanoprost (XALATAN) 0.005 % ophthalmic solution 1 drop at bedtime.   montelukast  (SINGULAIR ) 10 MG tablet Take 1 tablet (10 mg total) by mouth at bedtime.   Multiple Vitamin (MULTIVITAMIN) capsule Take 1 capsule by mouth daily.   omeprazole  (PRILOSEC) 40 MG capsule Take 30-60 min before first meal of the day   terazosin (HYTRIN) 5 MG capsule Take 5 mg by mouth at bedtime.   Tiotropium Bromide-Olodaterol (STIOLTO RESPIMAT) 2.5-2.5 MCG/ACT AERS Inhale 2 puffs into the lungs daily.   traZODone  (DESYREL ) 50 MG tablet TAKE 1 TABLET (50 MG TOTAL) BY MOUTH AT BEDTIME AS NEEDED FOR SLEEP.   triamcinolone  cream (KENALOG ) 0.5 % Apply 1 Application topically 3 (three) times daily.   No facility-administered encounter medications on file as of 12/11/2023.    Allergies (verified) Patient has no known allergies.   History: Past Medical History:  Diagnosis Date   Adenomatous polyp of colon 09/19/2021   Apr 08, 2016 Entered By: STONEY OZELL RAMAN Comment: Adenomatous polyp of the ascending colon (seven fragments) and sigmoid colon at 20cm in December 2001.     Agent orange exposure    COPD (chronic obstructive pulmonary disease) (HCC)    Coronary artery disease    Hyperlipidemia    PTSD (post-traumatic stress disorder)    Sleep apnea    Past Surgical History:  Procedure Laterality Date   COLONOSCOPY     fx ribs     TONSILLECTOMY     TRIGGER FINGER RELEASE Right 03/14/2023   thumb   Family History  Problem Relation Age of Onset   Emphysema Mother    Emphysema Father    Colon cancer Neg Hx    Rectal cancer Neg Hx    Stomach cancer Neg Hx    Social  History   Socioeconomic History   Marital status: Divorced    Spouse name: Not on file   Number of children: 2   Years of education: Not on file   Highest education level: Bachelor's degree (e.g., BA, AB, BS)  Occupational History   Occupation: retired    Comment: Aeronautical engineer  Tobacco Use   Smoking status: Former    Current packs/day: 0.00    Average packs/day: 1 pack/day for 40.0 years (40.0 ttl pk-yrs)    Types: Cigarettes    Start date: 12/31/1968    Quit date: 12/31/2008    Years since quitting: 14.9   Smokeless tobacco: Never   Tobacco comments:    10 cigs per day  Vaping Use   Vaping status: Never Used  Substance and Sexual Activity   Alcohol  use: Yes    Alcohol/week: 3.0 - 4.0 standard drinks of alcohol    Types: 3 - 4 Cans of beer per week    Comment: occassional, 3 beers or glasses of wine per week   Drug use: No   Sexual activity: Yes    Partners: Female  Other Topics Concern   Not on file  Social History Narrative   Not on file   Social Drivers of Health   Financial Resource Strain: Low Risk  (12/11/2023)   Overall Financial Resource Strain (CARDIA)    Difficulty of Paying Living Expenses: Not hard at all  Food Insecurity: No Food Insecurity (12/11/2023)   Hunger Vital Sign    Worried About Running Out of Food in the Last Year: Never true    Ran Out of Food in the Last Year: Never true  Transportation Needs: No Transportation Needs (12/11/2023)   PRAPARE - Administrator, Civil Service (Medical): No    Lack of Transportation (Non-Medical): No  Physical Activity: Inactive (12/11/2023)   Exercise Vital Sign    Days of Exercise per Week: 0 days    Minutes of Exercise per Session: 0 min  Stress: No Stress Concern Present (12/11/2023)   [employer] of Occupational Health - Occupational Stress Questionnaire    Feeling of Stress: Not at all  Social Connections: Moderately Integrated (12/11/2023)   Social Connection and Isolation Panel     Frequency of Communication with Friends and Family: More than three times a week    Frequency of Social Gatherings with Friends and Family: Once a week    Attends Religious Services: More than 4 times per year    Active Member of [employer] or Organizations: Yes    Attends Engineer, structural: More than 4 times per year    Marital Status: Divorced    Tobacco Counseling Counseling given: Not Answered Tobacco comments: 10 cigs per day    Clinical Intake:  Pre-visit preparation completed: Yes  Pain : 0-10 Pain Score: 7  Pain Type: Chronic pain Pain Location: Finger (Comment which one) (thumbs) Pain Orientation: Left, Right Pain Onset: More than a month ago Pain Frequency: Constant     Nutritional Risks: None Diabetes: No  Lab Results  Component Value Date   HGBA1C 5.5 10/16/2022   HGBA1C 5.4 09/07/2021     How often do you need to have someone help you when you read instructions, pamphlets, or other written materials from your doctor or pharmacy?: 1 - Never  Interpreter Needed?: No  Information entered by :: NAllen LPN   Activities of Daily Living     12/11/2023    4:18 PM  In your present state of health, do you have any difficulty performing the following activities:  Hearing? 0  Vision? 0  Difficulty concentrating or making decisions? 0  Walking or climbing stairs? 0  Dressing or bathing? 0  Doing errands, shopping? 0  Preparing Food and eating ? N  Using the Toilet? N  In the past six months, have you accidently leaked urine? Y  Comment ocassionally  Do you have problems with loss of bowel control? N  Managing your Medications? N  Managing your Finances? N  Housekeeping or managing your Housekeeping? N    Patient Care Team: Chandra Toribio POUR, MD as PCP - General (Family Medicine)  I have updated your Care Teams any recent Medical Services you may have received from other providers in the past year.  Assessment:   This is a routine wellness  examination for [place].  Hearing/Vision screen Hearing Screening - Comments:: Denies hearing issues Vision Screening - Comments:: [facility]   Goals Addressed             This Visit's Progress    Patient Stated       12/11/2023, wants to weight       Depression Screen     12/11/2023    4:26 PM 09/04/2023    3:00 PM 06/17/2023    3:24 PM 04/29/2023   10:43 AM 03/14/2023   10:49 AM 12/10/2022    1:22 PM 12/10/2022    1:21 PM  PHQ 2/9 Scores  PHQ - 2 Score 0 0 0 1 1 0 0  PHQ- 9 Score  0 0 1 4 0 0    Fall Risk     12/11/2023    4:25 PM 06/19/2023   10:38 AM 06/17/2023    9:53 AM 06/10/2023   10:27 AM 06/05/2023    9:56 AM  Fall Risk   Falls in the past year? 0 0 0 0 0  Number falls in past yr: 0 0 0 0 0  Injury with Fall? 0 0 0 0 0  Risk for fall due to : Medication side effect No Fall Risks No Fall Risks No Fall Risks No Fall Risks  Follow up Falls evaluation completed;Falls prevention discussed Falls evaluation completed Falls evaluation completed Falls evaluation completed;Falls prevention discussed Falls evaluation completed    MEDICARE RISK AT HOME:  Medicare Risk at Home Any stairs in or around the home?: Yes If so, are there any without handrails?: No Home free of loose throw rugs in walkways, pet beds, electrical cords, etc?: Yes Adequate lighting in your home to reduce risk of falls?: Yes Life alert?: No Use of a cane, walker or w/c?: No Grab bars in the bathroom?: No Shower chair or bench in shower?: Yes Elevated toilet seat or a handicapped toilet?: No  TIMED UP AND GO:  Was the test performed?  No  Cognitive Function: 6CIT completed        12/11/2023    4:26 PM 12/10/2022    1:24 PM 09/19/2021   10:57 AM  6CIT Screen  What Year? 0 points 0 points 0 points  What month? 0 points 0 points 0 points  What time? 0 points 0 points 0 points  Count back from 20 0 points 0 points 0 points  Months in reverse 2 points 0 points 2 points  Repeat  phrase 2 points 0 points 2 points  Total Score 4 points 0 points 4 points    Immunizations Immunization History  Administered Date(s) Administered    sv, Bivalent, Protein Subunit Rsvpref,pf (Abrysvo) 02/24/2023   Fluad Quad(high Dose 65+) 02/21/2021   Influenza Split 01/21/2011, 02/17/2012   Influenza Whole 01/20/2009   Influenza, High Dose Seasonal PF 05/05/2015, 01/22/2016, 02/06/2016, 02/14/2017, 01/18/2019, 02/20/2022, 01/30/2023   Influenza, Seasonal, Injecte, Preservative Fre 02/15/2009, 02/19/2010, 02/12/2011, 01/29/2012, 01/12/2013, 02/02/2014   Influenza-Unspecified 02/20/1998, 02/18/2000, 02/24/2001, 02/17/2002, 04/18/2003, 02/28/2004, 02/19/2005, 01/20/2006, 02/25/2007, 01/06/2008, 02/19/2010, 12/21/2012, 02/18/2018   PNEUMOCOCCAL CONJUGATE-20 06/23/2023   Pneumococcal Conjugate-13 02/02/2014   Pneumococcal Polysaccharide-23 04/23/2007, 01/06/2008, 08/14/2012, 01/27/2013, 08/20/2016   Td 10/13/1997, 04/23/2004   Td (Adult),unspecified 10/13/1997   Tdap 09/21/2006, 08/24/2011, 09/18/2011, 08/22/2021   Tetanus 08/24/2011   Varicella 04/22/2009   Zoster Recombinant(Shingrix) 02/10/2019, 04/12/2019   Zoster, Live 02/02/2014    Screening Tests Health Maintenance  Topic  Date Due   COVID-19 Vaccine (1) Never done   INFLUENZA VACCINE  11/21/2023   Lung Cancer Screening  05/20/2024   Medicare Annual Wellness (AWV)  12/10/2024   DTaP/Tdap/Td (9 - Td or Tdap) 08/23/2031   Pneumococcal Vaccine: 50+ Years  Completed   Hepatitis C Screening  Completed   Zoster Vaccines- Shingrix  Completed   HPV VACCINES  Aged Out   Meningococcal B Vaccine  Aged Out   Colonoscopy  Discontinued    Health Maintenance  Health Maintenance Due  Topic Date Due   COVID-19 Vaccine (1) Never done   INFLUENZA VACCINE  11/21/2023   Health Maintenance Items Addressed: Declines covid vaccine. Due for flu vaccine.  Additional Screening:  Vision Screening: Recommended annual ophthalmology exams  for early detection of glaucoma and other disorders of the eye. Would you like a referral to an eye doctor? No    Dental Screening: Recommended annual dental exams for proper oral hygiene  Community Resource Referral / Chronic Care Management: CRR required this visit?  No   CCM required this visit?  No   Plan:    I have personally reviewed and noted the following in the patient's chart:   Medical and social history Use of alcohol, tobacco or illicit drugs  Current medications and supplements including opioid prescriptions. Patient is not currently taking opioid prescriptions. Functional ability and status Nutritional status Physical activity Advanced directives List of other physicians Hospitalizations, surgeries, and ER visits in previous 12 months Vitals Screenings to include cognitive, depression, and falls Referrals and appointments  In addition, I have reviewed and discussed with patient certain preventive protocols, quality metrics, and best practice recommendations. A written personalized care plan for preventive services as well as general preventive health recommendations were provided to patient.   Ardella FORBES Dawn, LPN   1/[id number]/7974   After Visit Summary: (Pick Up) Due to this being a telephonic visit, with patients personalized plan was offered to patient and patient has requested to Pick up at office.  Notes: Nothing significant to report at this time.

## 2023-12-11 NOTE — Patient Instructions (Signed)
 [name] , Thank you for taking time out of your busy schedule to complete your Annual Wellness Visit with me. I enjoyed our conversation and look forward to speaking with you again next year. I, as well as your care team,  appreciate your ongoing commitment to your health goals. Please review the following plan we discussed and let me know if I can assist you in the future. Your Game plan/ To Do List    Referrals: If you haven't heard from the office you've been referred to, please reach out to them at the phone provided.   Follow up Visits: We will see or speak with you next year for your Next Medicare AWV with our clinical staff Have you seen your provider in the last 6 months (3 months if uncontrolled diabetes)? Yes  Clinician Recommendations:  Aim for 30 minutes of exercise or brisk walking, 6-8 glasses of water, and 5 servings of fruits and vegetables each day.       This is a list of the screenings recommended for you:  Health Maintenance  Topic Date Due   COVID-19 Vaccine (1) Never done   Flu Shot  11/21/2023   Screening for Lung Cancer  05/20/2024   Medicare Annual Wellness Visit  12/10/2024   DTaP/Tdap/Td vaccine (9 - Td or Tdap) 08/23/2031   Pneumococcal Vaccine for age over 48  Completed   Hepatitis C Screening  Completed   Zoster (Shingles) Vaccine  Completed   HPV Vaccine  Aged Out   Meningitis B Vaccine  Aged Out   Colon Cancer Screening  Discontinued    Advanced directives: (Copy Requested) Please bring a copy of your health care power of attorney and living will to the office to be added to your chart at your convenience. You can mail to [facility] [address] [place], [place] [address] or email to [email or web address] Advance Care Planning is important because it:  [x]  Makes sure you receive the medical care that is consistent with your values, goals, and preferences  [x]  It provides guidance to your family and loved ones and reduces  their decisional burden about whether or not they are making the right decisions based on your wishes.  Follow the link provided in your after visit summary or read over the paperwork we have mailed to you to help you started getting your Advance Directives in place. If you need assistance in completing these, please reach out to us  so that we can help you!  See attachments for Preventive Care and Fall Prevention Tips.

## 2024-01-01 ENCOUNTER — Ambulatory Visit: Admitting: Family Medicine

## 2024-01-20 ENCOUNTER — Ambulatory Visit: Admitting: Family Medicine

## 2024-01-20 ENCOUNTER — Encounter: Payer: Self-pay | Admitting: Family Medicine

## 2024-01-20 VITALS — BP 115/69 | HR 79 | Ht 69.0 in | Wt 230.8 lb

## 2024-01-20 DIAGNOSIS — I2583 Coronary atherosclerosis due to lipid rich plaque: Secondary | ICD-10-CM | POA: Diagnosis not present

## 2024-01-20 DIAGNOSIS — J449 Chronic obstructive pulmonary disease, unspecified: Secondary | ICD-10-CM | POA: Diagnosis not present

## 2024-01-20 DIAGNOSIS — I2510 Atherosclerotic heart disease of native coronary artery without angina pectoris: Secondary | ICD-10-CM | POA: Diagnosis not present

## 2024-01-20 DIAGNOSIS — F5105 Insomnia due to other mental disorder: Secondary | ICD-10-CM

## 2024-01-20 DIAGNOSIS — M1810 Unilateral primary osteoarthritis of first carpometacarpal joint, unspecified hand: Secondary | ICD-10-CM | POA: Diagnosis not present

## 2024-01-20 DIAGNOSIS — I10 Essential (primary) hypertension: Secondary | ICD-10-CM

## 2024-01-20 DIAGNOSIS — F409 Phobic anxiety disorder, unspecified: Secondary | ICD-10-CM

## 2024-01-20 DIAGNOSIS — F411 Generalized anxiety disorder: Secondary | ICD-10-CM

## 2024-01-20 MED ORDER — TRAZODONE HCL 50 MG PO TABS: 50.0000 mg | ORAL_TABLET | Freq: Every evening | ORAL | 3 refills | Status: AC | PRN

## 2024-01-20 MED ORDER — CITALOPRAM HYDROBROMIDE 10 MG PO TABS: 10.0000 mg | ORAL_TABLET | Freq: Every day | ORAL | 3 refills | Status: AC

## 2024-01-20 NOTE — Assessment & Plan Note (Signed)
 Bilateral Thumb Arthritis: Worsening symptoms despite conservative measures. Has seen an orthopedist. - Continue current management. - Follow up with orthopedics as scheduled to discuss surgical options.

## 2024-01-20 NOTE — Progress Notes (Signed)
   Established Patient Office Visit  Subjective   Patient ID: Arthur Harper, male    DOB: [birth year]  Age: 78 y.o. MRN: [id number]  Chief Complaint  Patient presents with   Medical Management of Chronic Issues    HPI  Subjective - Worsening bilateral thumb arthritis. Has seen orthopedics and is scheduled for a follow-up to discuss surgery. Had prior trigger thumb surgery about one year ago. Use of therapeutic splints is not providing significant relief. - No longer requires supplemental oxygen. Uses CPAP at night.  Medications Current medications reviewed: azithromycin  daily for COPD, baby aspirin, multivitamin, atorvastatin  (Lipitor), a once-daily inhaler, a twice-daily inhaler (albuterol , needs refill), ipratropium nasal spray, terazosin nightly for urinary symptoms, and citalopram  (Celexa ) daily. Is no longer taking amlodipine.  PMH, PSH, FH, Social Hx PMHx: Arthritis, COPD, hypertension, hyperlipidemia. PSHx: Trigger thumb surgery (~1 year ago). 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 tomorrow.  ROS Constitutional: Denies new issues other than thumbs. Respiratory: Denies need for oxygen.    The ASCVD Risk score (Arnett DK, et al., 2019) failed to calculate for the following reasons:   The valid total cholesterol range is 130 to 320 mg/dL  Health Maintenance Due  Topic Date Due   COVID-19 Vaccine (1) Never done      Objective:     BP 115/69   Pulse 79   Ht 5' 9 (1.753 m)   Wt 230 lb 12 oz (104.7 kg)   SpO2 95%   BMI 34.08 kg/m    Physical Exam Gen: alert, oriented Pulm: no respiratory distress Psych: pleasant affect   No results found for any visits on 01/20/24.      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 Has seen an orthopedist. - Continue  current management. - Follow up with orthopedics as scheduled to discuss surgical options.   Generalized anxiety disorder -     Citalopram  Hydrobromide; Take 1 tablet (10 mg total) by mouth daily.  Dispense: 90 tablet; Refill: 3  Insomnia due to anxiety and fear -     traZODone  HCl; Take 1 tablet (50 mg total) by mouth at bedtime as needed for sleep.  Dispense: 90 tablet; Refill: 3  COPD  GOLD 2  Assessment & Plan: Stable. Off oxygen. - Continue daily azithromycin  and maint. inhalers.   Coronary artery disease due to lipid rich plaque Assessment & Plan: - Continue atorvastatin , and asa 81   Hypertension, unspecified type Assessment & Plan: - no longer taking amlodipine. BP in normal range today      Return in about 6 months (around 07/19/2024) for hld, HTN.    Toribio MARLA Slain, MD

## 2024-01-20 NOTE — Assessment & Plan Note (Signed)
 Stable. Off oxygen. - Continue daily azithromycin  and maint. inhalers.

## 2024-01-20 NOTE — Assessment & Plan Note (Signed)
-   no longer taking amlodipine. BP in normal range today

## 2024-01-20 NOTE — Patient Instructions (Addendum)
 It was nice to see you today,  We addressed the following topics today: -I have sent in refills of your citalopram  and trazodone  to [place] - When you get your flu vaccine you can ask them about the RSV vaccine.  You are up-to-date on the pneumococcal vaccine.  Have a great day,  Rolan Slain, MD

## 2024-01-20 NOTE — Assessment & Plan Note (Signed)
-   Continue atorvastatin , and asa 81

## 2024-04-12 ENCOUNTER — Ambulatory Visit: Admitting: Internal Medicine

## 2024-04-12 ENCOUNTER — Encounter: Payer: Self-pay | Admitting: Internal Medicine

## 2024-04-12 VITALS — BP 118/64 | HR 77 | Ht 69.0 in | Wt 231.0 lb

## 2024-04-12 DIAGNOSIS — R918 Other nonspecific abnormal finding of lung field: Secondary | ICD-10-CM

## 2024-04-12 DIAGNOSIS — Z87891 Personal history of nicotine dependence: Secondary | ICD-10-CM | POA: Diagnosis not present

## 2024-04-12 DIAGNOSIS — J449 Chronic obstructive pulmonary disease, unspecified: Secondary | ICD-10-CM

## 2024-04-12 MED ORDER — ASMANEX HFA 200 MCG/ACT IN AERO: INHALATION_SPRAY | RESPIRATORY_TRACT | Status: AC

## 2024-04-12 NOTE — Assessment & Plan Note (Addendum)
 Quit smoking 2010/ MM - Spirometry 07/03/20  FEV1 1.94 (59%)  Ratio 0.50 p 30% improvement from saba   - 02/08/2021   Walked on RA x  3  lap(s) =  approx 750 @ nl pace, stopped due to end of study but sob p one lap with lowest 02 sats 95%  - 02/08/2021   Try symbicort /spiriva  - Labs ordered 02/08/2021  :  allergy profile  Eos 0.2/ IgE not done/ alpha one AT phenotype MM level 167  - PFT's  03/22/21 FEV1 1.9 (72 % ) ratio 0.57  p 13 % improvement from saba p symbicort /spiriva  prior to study with DLCO  Nl FV curve classic concavity  02/05/2022  After extensive coaching inhaler device,  effectiveness =   90% > continue breztri   - 02/18/2023 final pulmoanry ov/ changed to stiolto due to [place] restrictions with severe dry daytime cough > rec max gerd rx > [facility]  - 04/12/2024  After extensive coaching inhaler device,  effectiveness =    95% spacer / hfa and asmanex  with spacer  - 04/12/2024  flutter valve training   Group E in terms of symptoms/risk so  laba/lama/ICS  therefore appropriate rx at this point >>>  stiolto and asmanex  200 per [facility]   and  more approp SABA prn:  Re SABA :  I spent extra time with pt today reviewing appropriate use of albuterol  for prn use on exertion with the following points: 1) saba is for relief of sob that does not improve by walking a slower pace or resting but rather if the pt does not improve after trying this first. 2) If the pt is convinced, as many are, that saba helps recover from activity faster then it's easy to tell if this is the case by re-challenging : ie stop, take the inhaler, then p 5 minutes try the exact same activity (intensity of workload) that just caused the symptoms and see if they are substantially diminished or not after saba 3) if there is an activity that reproducibly causes the symptoms, try the saba 15 min before the activity on alternate days   If in fact the saba really does help, then fine to continue to use it prn but advised may need to look  closer at the maintenance regimen being used to achieve better control of airways disease with exertion.    For cough c/w CB  >>> mucinex or mucinex dm prn as per AVS

## 2024-04-12 NOTE — Assessment & Plan Note (Addendum)
 Quit smoking 2010 -03/04/24  [facility]  1.  Right upper lobe nodule measuring 0.5 cm (series 3 image 384).  2.   Left lower lobe nodule measuring 0.5 cm over series 3 image 563).   He is now out 15 years from smoking so nearing end of surveillance recs but defer to [place] when to stop scanning in this setting but suggest following current guidelines depdending on stability of nodules vs priors   Each RESP  maintenance medication was reviewed in detail including emphasizing most importantly the difference between maintenance and prns and under what circumstances the prns are to be triggered using an action plan format where appropriate.  Total time for H and P, chart review, counseling, reviewing hfa/spacer/ smi/ flutter  device(s) and generating customized AVS unique to this office visit / same day charting = 31 min

## 2024-04-12 NOTE — Progress Notes (Signed)
 "  Arthur Harper, male    DOB: 1945-10-22,    MRN: [id number]   Brief patient profile:  63 yowm MM/quit smoking 2010 @ wt 180  referred to pulmonary clinic [facility]/08/2021 [facility] with GOLD 2 criteria in 05/2010.       History of Present Illness  02/08/2021  Pulmonary/ 1st office eval/[name]  Chief Complaint  Patient presents with   Consult    Referred by Dr. Corrie from [facility]  Dyspnea:  has [place] stops every 5-10 min no routine  Cough: tickle in throat for more than a year or two, min clear  mucus prod/ slt hoarse Sleep: cpap per Clance  SABA use: once or twice a month hfa/ no neb  Rec Plan A = Automatic = Always=  Symbicort   Take 2 puffs first thing in am and then another 2 puffs about 12 hours later and spiriva  2 puffs in am  Work on inhaler technique:    Plan B = Backup (to supplement plan A, not to replace it) Only use your albuterol  inhaler as a rescue medication  Ok to try albuterol  15 min before an activity (on alternating days)  that you know would usually make you short of breath         02/05/2022  f/u ov/[name] re: GOLD 2 COPD    maint on Breztri    Chief Complaint  Patient presents with   Follow-up    Pt states he has a a severe pain in the right backside of his head that started x 2 nights ago.  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 [facility] on 2lpm    10/29/2023  f/u ov/[name] re: GOLD 2 COPD   maint [facility] ( Chief Complaint  Patient presents  with   Follow-up    Pt was in the hospital for 8 days in May due to respiratory issues.  SOB worse with exertion.     Dysp[place]:  [place] 90% RA Cough: none  Sleeping: on cpap flat bed/ wedge pillow s  resp cc  SABA use: sev times a day  02: not using   Rec Blow mometasone out thru nose. Please schedule a follow up visit in 6 months but call sooner if needed  with all respiratory  medications /inhalers/ solutions in hand    03/04/24  [facility]  1.  Right upper lobe nodule measuring 0.5 cm (series 3 image 384).  2.   Left lower lobe nodule measuring 0.5 cm over series 3 image 563).      04/12/2024  f/u ov/[name] re: GOLD 2 COPD   [facility]  Cc doe  Dyspnea:  no change  [place]  Cough:  Sleeping: flat bed cpap thru cpap  SABA use: 4 x weekly p exertion only  02: none   Lung cancer screening :  [facility] as [facility]  in [facility]    No obvious day to day or daytime variability or assoc excess/ purulent sputum or mucus plugs or  hemoptysis or cp or chest tightness, subjective wheeze or overt sinus or hb symptoms.    Also denies any obvious fluctuation of symptoms with weather or environmental changes or other aggravating or alleviating factors except as outlined above   No unusual exposure hx or h/o childhood pna/ asthma or knowledge of premature birth.  Current Allergies, Complete Past Medical History, Past Surgical History, Family History, and Social History were reviewed in [employer] record.  ROS  The following are not active complaints unless bolded Hoarseness, sore throat, dysphagia, dental problems, itching, sneezing,  nasal congestion or discharge of excess mucus or purulent secretions, ear ache,   fever, chills, sweats, unintended wt loss or wt gain, classically pleuritic or exertional cp,  orthopnea pnd or arm/hand swelling  or leg swelling, presyncope, palpitations, abdominal pain, anorexia, nausea, vomiting, diarrhea  or change in bowel habits or change in bladder habits, change in stools or change in urine, dysuria, hematuria,  rash, arthralgias, visual complaints, headache, numbness, weakness or ataxia or problems with walking or coordination,  change in mood or  memory.          Outpatient Medications Prior to Visit  Medication Sig Dispense Refill   albuterol  (VENTOLIN  HFA) 108 (90 Base) MCG/ACT inhaler Inhale 2 puffs into the lungs every 6 (six) hours as needed. 18 g 2   ammonium lactate (LAC-HYDRIN) 12 % lotion Apply 1 Application topically as needed for dry skin.     aspirin 81 MG chewable tablet Chew 81 mg by mouth daily.     atorvastatin  (LIPITOR) 20 MG tablet TAKE 1 TABLET (20 MG TOTAL) BY MOUTH DAILY. 90 tablet 1   azithromycin  (ZITHROMAX ) 250 MG tablet Take 250 mg by mouth.     budesonide  (RHINOCORT  AQUA) 32 MCG/ACT nasal spray Place 64 mcg into the nose.     carboxymethylcellulose (REFRESH PLUS) 0.5 % SOLN INSTILL 1 DROP IN BOTH EYES FOUR TIMES A DAY AS NEEDED      cetirizine (ZYRTEC) 10 MG tablet Take by mouth.     citalopram  (CELEXA ) 10 MG tablet Take 1 tablet (10 mg total) by mouth daily. 90 tablet 3  diclofenac Sodium (VOLTAREN) 1 % GEL Apply 2 g topically 4 (four) times daily.     hydrocortisone 2.5 % cream APPLY SMALL AMOUNT TO AFFECTED AREA TWICE A DAY AS NEEDED -MIX WITH KETOCONAZOLE CREAM AND APPLY TO THE RED AREAS UNDER ARMPITS AND  IN THE GROIN TWICE DAILY AS NEEDED -MIX WITH KETOCONAZOLE CREAM AND APPLY TO THE RED AREAS UNDER ARMPITS AND   IN THE GROIN TWICE DAILY AS NEEDED     ibuprofen (ADVIL) 600 MG tablet Take 600 mg by mouth every 6 (six) hours as needed.     ketoconazole (NIZORAL) 2 % cream APPLY SMALL AMOUNT TO AFFECTED AREA TWICE A DAY -MIX WITH HYDROCORTISONE CREAM AND APPLY TO THE RED AREAS IN THE GROIN AND  ARMPIT TWICE DAILY AS NEEDED -MIX WITH HYDROCORTISONE CREAM AND APPLY TO THE RED AREAS IN THE GROIN AND  ARMPIT TWICE DAILY AS NEEDED     latanoprost (XALATAN) 0.005 % ophthalmic solution 1 drop at bedtime.     Multiple Vitamin (MULTIVITAMIN) capsule Take 1 capsule by mouth daily.     terazosin (HYTRIN) 5 MG capsule Take 5 mg by mouth at bedtime.     Tiotropium Bromide-Olodaterol (STIOLTO RESPIMAT) 2.5-2.5 MCG/ACT AERS Inhale 2 puffs into the lungs daily.     traZODone  (DESYREL ) 50 MG tablet Take 1 tablet (50 mg total) by mouth at bedtime as needed for sleep. 90 tablet 3   triamcinolone  cream (KENALOG ) 0.5 % Apply 1 Application topically 3 (three) times daily. 30 g 2   famotidine  (PEPCID ) 20 MG tablet One after supper (Patient not taking: Reported on 04/12/2024) 30 tablet 11   fluticasone  (FLONASE ) 50 MCG/ACT nasal spray Place 1-2 sprays into both nostrils daily. (Patient not taking: Reported on 04/12/2024) 16 g 2   montelukast  (SINGULAIR ) 10 MG tablet Take 1 tablet (10 mg total) by mouth at bedtime. (Patient not taking: Reported on 04/12/2024) 30 tablet 5   omeprazole  (PRILOSEC) 40 MG capsule Take 30-60 min before first meal of the  day (Patient not taking: Reported on 04/12/2024) 30 capsule 11   No facility-administered medications prior to visit.         Past Medical History:  Diagnosis Date   Agent orange exposure    COPD (chronic obstructive pulmonary disease) (HCC)    PTSD (post-traumatic stress disorder)    Sleep apnea      Objective:    wts   04/12/2024     231  10/29/2023         234  02/18/2023     241   02/05/2022     220   06/20/21 221 lb (100.2 kg)  06/12/21 224 lb 6.4 oz (101.8 kg)  05/29/21 227 lb (103 kg)    Vital signs reviewed  04/12/2024  - Note at rest 02 sats  95% on RA   General appearance:    pleasant amb wm nad    HEENT : Oropharynx  clear   Nasal turbinates nl    NECK :  without  apparent JVD/ palpable Nodes/TM    LUNGS: no acc muscle use,  Min barrel  contour chest wall with bilateral  slightly decreased bs s audible wheeze and  without cough on insp or exp maneuvers and min  Hyperresonant  to  percussion bilaterally    CV:  RRR  no s3 or murmur or increase in P2, and no edema   ABD:  soft and nontender    MS:  Nl gait/ ext warm without  deformities Or obvious joint restrictions  calf tenderness, cyanosis or clubbing     SKIN: warm and dry without lesions    NEURO:  alert, approp, nl sensorium with  no motor or cerebellar deficits apparent.                 Assessment   Assessment & Plan COPD  GOLD 2  Quit smoking 2010/ MM - Spirometry 07/03/20  FEV1 1.94 (59%)  Ratio 0.50 p 30% improvement from saba   - 02/08/2021   Walked on RA x  3  lap(s) =  approx 750 @ nl pace, stopped due to end of study but sob p one lap with lowest 02 sats 95%  - 02/08/2021   Try symbicort /spiriva  - Labs ordered 02/08/2021  :  allergy profile  Eos 0.2/ IgE not done/ alpha one AT phenotype MM level 167  - PFT's  03/22/21 FEV1 1.9 (72 % ) ratio 0.57  p 13 % improvement from saba p symbicort /spiriva  prior to study with DLCO  Nl FV curve classic concavity  02/05/2022  After extensive  coaching inhaler device,  effectiveness =   90% > continue breztri   - 02/18/2023 final pulmoanry ov/ changed to stiolto due to [place] restrictions with severe dry daytime cough > rec max gerd rx > [facility]  - 04/12/2024  After extensive coaching inhaler device,  effectiveness =    95% spacer / hfa and asmanex  with spacer  - 04/12/2024  flutter valve training   Group E in terms of symptoms/risk so  laba/lama/ICS  therefore appropriate rx at this point >>>  stiolto and asmanex  200 per [facility]   and  more approp SABA prn:  Re SABA :  I spent extra time with pt today reviewing appropriate use of albuterol  for prn use on exertion with the following points: 1) saba is for relief of sob that does not improve by walking a slower pace or resting but rather if the pt does not improve after trying this first. 2) If the pt is convinced, as many are, that saba helps recover from activity faster then it's easy to tell if this is the case by re-challenging : ie stop, take the inhaler, then p 5 minutes try the exact same activity (intensity of workload) that just caused the symptoms and see if they are substantially diminished or not after saba 3) if there is an activity that reproducibly causes the symptoms, try the saba 15 min before the activity on alternate days   If in fact the saba really does help, then fine to continue to use it prn but advised may need to look closer at the maintenance regimen being used to achieve better control of airways disease with exertion.    For cough c/w CB  >>> mucinex or mucinex dm prn as per AVS   Multiple pulmonary nodules determined by computed tomography of lung Quit smoking 2010 -03/04/24  [facility]  1.  Right upper lobe nodule measuring 0.5 cm (series 3 image 384).  2.   Left lower lobe nodule measuring 0.5 cm over series 3 image 563).   He is now out 15 years from smoking so nearing end of surveillance recs but defer to [place] when to stop scanning in this setting but suggest  following current guidelines depdending on stability of nodules vs priors   Each RESP  maintenance medication was reviewed in detail including emphasizing most importantly the difference between maintenance and prns and under what circumstances  the prns are to be triggered using an action plan format where appropriate.  Total time for H and P, chart review, counseling, reviewing hfa/spacer/ smi/ flutter  device(s) and generating customized AVS unique to this office visit / same day charting = 31 min           AVS  Patient Instructions  For cough/ congestion > mucinex or mucinex dm  up to maximum of  1200 mg every 12 hours and use the flutter valve as much as you can    Use your albuterol  as a rescue medication to be used if you can't catch your breath by resting, slowing your pace,  or doing a relaxed purse lip breathing pattern.  - The less you use it, the better it will work when you need it. - Ok to use up to 2 puffs  every 4 hours if you must but call for  appointment if use goes up over your usual need - Don't leave home without it !!  (think of it like a spare tire or starter fluid for your car)   Also  Ok to try albuterol  15 min before an activity (on alternating days)  that you know would usually make you short of breath and see if it makes any difference and if makes none then don't take albuterol  after activity unless you can't catch your breath as this means it's the resting that helps, not the albuterol .   Please schedule a follow up visit in 12  months but call sooner if needed            Ozell America, MD 04/12/2024              "

## 2024-04-12 NOTE — Patient Instructions (Addendum)
 For cough/ congestion > mucinex or mucinex dm  up to maximum of  1200 mg every 12 hours and use the flutter valve as much as you can    Use your albuterol  as a rescue medication to be used if you can't catch your breath by resting, slowing your pace,  or doing a relaxed purse lip breathing pattern.  - The less you use it, the better it will work when you need it. - Ok to use up to 2 puffs  every 4 hours if you must but call for  appointment if use goes up over your usual need - Don't leave home without it !!  (think of it like a spare tire or starter fluid for your car)   Also  Ok to try albuterol  15 min before an activity (on alternating days)  that you know would usually make you short of breath and see if it makes any difference and if makes none then don't take albuterol  after activity unless you can't catch your breath as this means it's the resting that helps, not the albuterol .   Please schedule a follow up visit in 12  months but call sooner if needed

## 2024-04-26 ENCOUNTER — Ambulatory Visit: Payer: Self-pay | Admitting: *Deleted

## 2024-04-26 ENCOUNTER — Ambulatory Visit: Admitting: Family Medicine

## 2024-04-26 NOTE — Telephone Encounter (Signed)
 FYI Only or Action Required?: FYI only for provider: appointment scheduled on 04/26/24.  Patient was last seen in primary care on 01/20/2024 by Chandra Toribio POUR, MD.  Called Nurse Triage reporting Chest Pain.  Symptoms began several days ago. Couple of day ago  Interventions attempted: Rest, hydration, or home remedies.  Symptoms are: gradually worsening.  Triage Disposition: See Physician Within 24 Hours  4- 24 hours  Patient/caregiver understands and will follow disposition?: Yes  Recommended if sx worsen go to ED.                  Copied from CRM [id number]. Topic: Clinical - Red Word Triage >> Apr 26, 2024  8:47 AM Charlet HERO wrote: Red Word that prompted transfer to Nurse Triage: problems breathing and chest pain for a couple of days. Dr Chandra Reason for Disposition  [1] Chest pain lasts > 5 minutes AND [2] occurred > 3 days ago (72 hours) AND [3] NO chest pain or cardiac symptoms now  Answer Assessment - Initial Assessment Questions Appt scheduled today . Denies chest pain now . SOB with exertion. Hx COPD. Pain breathing in at times, none now. Reports similar sx happened last year and requesting to be seen before sx worsen. Recommended if sx worsen go to ED.       1. LOCATION: Where does it hurt?       Center across chest  2. RADIATION: Does the pain go anywhere else? (e.g., into neck, jaw, arms, back)     Na  3. ONSET: When did the chest pain begin? (Minutes, hours or days)      2 days ago  4. PATTERN: Does the pain come and go, or has it been constant since it started?  Does it get worse with exertion?      Comes and goes SOB with exertion , pain breathing in at times. 5. DURATION: How long does it last (e.g., seconds, minutes, hours)     Did not report 6. SEVERITY: How bad is the pain?  (e.g., Scale 1-10; mild, moderate, or severe)     No pain now  7. CARDIAC RISK FACTORS: Do you have any history of heart problems or risk factors for heart  disease? (e.g., angina, prior heart attack; diabetes, high blood pressure, high cholesterol, smoker, or strong family history of heart disease)     See Hx  8. PULMONARY RISK FACTORS: Do you have any history of lung disease?  (e.g., blood clots in lung, asthma, emphysema, birth control pills)     COPD does not wear oxygen 9. CAUSE: What do you think is causing the chest pain?     Chest cold 10. OTHER SYMPTOMS: Do you have any other symptoms? (e.g., dizziness, nausea, vomiting, sweating, fever, difficulty breathing, cough)       Chest pain comes and goes none now. SOB with exertion, cough productive , gray sputum. No fever. 11. PREGNANCY: Is there any chance you are pregnant? When was your last menstrual period?       na  Protocols used: Chest Pain-A-AH

## 2024-05-03 NOTE — Progress Notes (Unsigned)
 " Cardiology Office Note:    Date:  05/03/2024   ID:  Arthur Harper, DOB [birth year], MRN [id number]  PCP:  Chandra Toribio POUR, MD  Cardiologist:  None  Electrophysiologist:  None   Referring MD: Chandra Toribio POUR, MD   No chief complaint on file.   History of Present Illness:    Arthur Harper is a 79 y.o. male with a hx of COPD, OSA who is referred by Powell Lessen, NP for evaluation of coronary calcifications.  CT chest for lung cancer screening 07/2022, noted to have coronary calcifications.  He reports he is having dyspnea with minimal exertion.  States that walking flight of stairs will feel short of breath.  Also reports left-sided chest pain, describes as sharp pain that can happen with rest or exertion, can last for 30 minutes.  He had prior syncopal episode during severe coughing spells but none recently.  ETT 04/2021 showed fair exercise capacity (7.9 METS), no evidence of ischemia.  Echocardiogram 11/14/2022 showed EF 60 to 65%, grade 1 diastolic dysfunction, normal RV function, no significant valvular disease.  Coronary CTA 11/19/2022 showed nonobstructive CAD with mild stenosis in proximal to mid LAD, calcium  score 507 (63rd percentile).  Since last clinic visit,  he reports that he is doing well.  Continues to have some shortness of breath.  Denies any chest pain, lightheadedness, syncope, lower extremity edema, or palpitations.    Past Medical History:  Diagnosis Date   Adenomatous polyp of colon 09/19/2021   Apr 08, 2016 Entered By: STONEY OZELL RAMAN Comment: Adenomatous polyp of the ascending colon (seven fragments) and sigmoid colon at 20cm in December 2001.     Agent orange exposure    COPD (chronic obstructive pulmonary disease) (HCC)    Coronary artery disease    Hyperlipidemia    PTSD (post-traumatic stress disorder)    Sleep apnea     Past Surgical History:  Procedure Laterality Date   COLONOSCOPY     fx ribs     TONSILLECTOMY     TRIGGER FINGER  RELEASE Right 03/14/2023   thumb    Current Medications: No outpatient medications have been marked as taking for the 05/05/24 encounter (Appointment) with Kate Lonni CROME, MD.     Allergies:   Patient has no known allergies.   Social History   Socioeconomic History   Marital status: Divorced    Spouse name: Not on file   Number of children: 2   Years of education: Not on file   Highest education level: Bachelor's degree (e.g., BA, AB, BS)  Occupational History   Occupation: retired    Comment: aeronautical engineer  Tobacco Use   Smoking status: Former    Current packs/day: 0.00    Average packs/day: 1 pack/day for 40.0 years (40.0 ttl pk-yrs)    Types: Cigarettes    Start date: 12/31/1968    Quit date: 12/31/2008    Years since quitting: 15.3   Smokeless tobacco: Never   Tobacco comments:    10 cigs per day  Vaping Use   Vaping status: Never Used  Substance and Sexual Activity   Alcohol use: Yes    Alcohol/week: 3.0 - 4.0 standard drinks of alcohol    Types: 3 - 4 Cans of beer per week    Comment: occassional, 3 beers or glasses of wine per week   Drug use: No   Sexual activity: Yes    Partners: Female  Other Topics Concern   Not on file  Social History Narrative   Not on file   Social Drivers of Health   Tobacco Use: Medium Risk (04/12/2024)   Patient History    Smoking Tobacco Use: Former    Smokeless Tobacco Use: Never    Passive Exposure: Not on file  Financial Resource Strain: Low Risk (12/11/2023)   Overall Financial Resource Strain (CARDIA)    Difficulty of Paying Living Expenses: Not hard at all  Food Insecurity: No Food Insecurity (12/11/2023)   Epic    Worried About Radiation Protection Practitioner of Food in the Last Year: Never true    Ran Out of Food in the Last Year: Never true  Transportation Needs: No Transportation Needs (12/11/2023)   Epic    Lack of Transportation (Medical): No    Lack of Transportation (Non-Medical): No  Physical Activity: Inactive  (12/11/2023)   Exercise Vital Sign    Days of Exercise per Week: 0 days    Minutes of Exercise per Session: 0 min  Stress: No Stress Concern Present (12/11/2023)   [employer] of Occupational Health - Occupational Stress Questionnaire    Feeling of Stress: Not at all  Social Connections: Moderately Integrated (12/11/2023)   Social Connection and Isolation Panel    Frequency of Communication with Friends and Family: More than three times a week    Frequency of Social Gatherings with Friends and Family: Once a week    Attends Religious Services: More than 4 times per year    Active Member of Clubs or Organizations: Yes    Attends Banker Meetings: More than 4 times per year    Marital Status: Divorced  Depression (PHQ2-9): Low Risk (01/20/2024)   Depression (PHQ2-9)    PHQ-2 Score: 1  Alcohol Screen: Low Risk (12/11/2023)   Alcohol Screen    Last Alcohol Screening Score (AUDIT): 3  Housing: Unknown (12/11/2023)   Epic    Unable to Pay for Housing in the Last Year: No    Number of Times Moved in the Last Year: Not on file    Homeless in the Last Year: No  Utilities: Not At Risk (12/11/2023)   Epic    Threatened with loss of utilities: No  Health Literacy: Adequate Health Literacy (12/11/2023)   B1300 Health Literacy    Frequency of need for help with medical instructions: Never     Family History: The patient's family history includes Emphysema in his father and mother. There is no history of Colon cancer, Rectal cancer, or Stomach cancer.  ROS:   Please see the history of present illness.     All other systems reviewed and are negative.  EKGs/Labs/Other Studies Reviewed:    The following studies were reviewed today:   EKG:   10/21/22: Normal sinus rhythm, left axis deviation, no ST abnormalities  Recent Labs: No results found for requested labs within last 365 days.  Recent Lipid Panel    Component Value Date/Time   CHOL 142 02/21/2023 1041   TRIG 41  02/21/2023 1041   HDL 74 02/21/2023 1041   CHOLHDL 1.9 02/21/2023 1041   CHOLHDL 3 08/14/2012 1158   VLDL 13.0 08/14/2012 1158   LDLCALC 58 02/21/2023 1041    Physical Exam:    VS:  There were no vitals taken for this visit.    Wt Readings from Last 3 Encounters:  04/12/24 231 lb (104.8 kg)  01/20/24 230 lb 12 oz (104.7 kg)  10/29/23 234 lb (106.1 kg)     GEN:  Well nourished,  well developed in no acute distress HEENT: Normal NECK: No JVD; No carotid bruits LYMPHATICS: No lymphadenopathy CARDIAC: RRR, no murmurs, rubs, gallops RESPIRATORY: Scattered expiratory wheezing ABDOMEN: Soft, non-tender, non-distended MUSCULOSKELETAL:  No edema; No deformity  SKIN: Warm and dry NEUROLOGIC:  Alert and oriented x 3 PSYCHIATRIC:  Normal affect   ASSESSMENT:    No diagnosis found.   PLAN:    CAD: CT chest for lung cancer screening 07/2022 noted to have coronary calcifications.  He reported atypical chest pain.  Also noted to have dyspnea with minimal exertion, which could represent anginal equivalent.  Echocardiogram 11/14/2022 showed EF 60 to 65%, grade 1 diastolic dysfunction, normal RV function, no significant valvular disease.  Coronary CTA 11/19/2022 showed nonobstructive CAD with mild stenosis in proximal to mid LAD, calcium  score 507 (63rd percentile).  Hyperlipidemia: On atorvastatin  20 mg daily, LDL 58 on 02/21/2023  OSA: on CPAP, reports compliance  RTC in 6 months***   Medication Adjustments/Labs and Tests Ordered: Current medicines are reviewed at length with the patient today.  Concerns regarding medicines are outlined above.  No orders of the defined types were placed in this encounter.  No orders of the defined types were placed in this encounter.   There are no Patient Instructions on file for this visit.   Signed, Lonni LITTIE Nanas, MD  05/03/2024 5:40 PM    [facility] "

## 2024-05-05 ENCOUNTER — Ambulatory Visit: Admitting: Cardiology

## 2024-05-11 NOTE — Progress Notes (Unsigned)
 " Cardiology Office Note:    Date:  05/12/2024   ID:  Arthur, Harper 12-27-1945, MRN [id number]  PCP:  Chandra Toribio POUR, MD  Cardiologist:  None  Electrophysiologist:  None   Referring MD: Chandra Toribio POUR, MD   Chief Complaint  Patient presents with   Chest Pain    History of Present Illness:    Arthur Harper is a 79 y.o. male with a hx of COPD, OSA who presents for follow-up.  He was referred by Arthur Lessen, NP for evaluation of coronary calcifications, initially seen 10/21/2022.  CT chest for lung cancer screening 07/2022, noted to have coronary calcifications.  He reports he is having dyspnea with minimal exertion.  States that walking flight of stairs will feel short of breath.  Also reports left-sided chest pain, describes as sharp pain that can happen with rest or exertion, can last for 30 minutes.  He had prior syncopal episode during severe coughing spells but none recently.  ETT 04/2021 showed fair exercise capacity (7.9 METS), no evidence of ischemia.  Echocardiogram 11/14/2022 showed EF 60 to 65%, grade 1 diastolic dysfunction, normal RV function, no significant valvular disease.  Coronary CTA 11/19/2022 showed nonobstructive CAD with mild stenosis in proximal to mid LAD, calcium  score 507 (63rd percentile).  Since last clinic visit, he reports he has been doing okay.  Does report he has noted some left-sided chest pain.  Describes sharp pain, can last for few minutes.  Thinks may be related to stress.  Continues to have shortness of breath.  Denies any lightheadedness, syncope, or lower extremity edema.   Past Medical History:  Diagnosis Date   Adenomatous polyp of colon 09/19/2021   Apr 08, 2016 Entered By: STONEY OZELL RAMAN Comment: Adenomatous polyp of the ascending colon (seven fragments) and sigmoid colon at 20cm in December 2001.     Agent orange exposure    COPD (chronic obstructive pulmonary disease) (HCC)    Coronary artery disease    Hyperlipidemia     PTSD (post-traumatic stress disorder)    Sleep apnea     Past Surgical History:  Procedure Laterality Date   COLONOSCOPY     fx ribs     TONSILLECTOMY     TRIGGER FINGER RELEASE Right 03/14/2023   thumb    Current Medications: Current Meds  Medication Sig   albuterol  (VENTOLIN  HFA) 108 (90 Base) MCG/ACT inhaler Inhale 2 puffs into the lungs every 6 (six) hours as needed.   ammonium lactate (LAC-HYDRIN) 12 % lotion Apply 1 Application topically as needed for dry skin.   aspirin 81 MG chewable tablet Chew 81 mg by mouth daily.   atorvastatin  (LIPITOR) 20 MG tablet TAKE 1 TABLET (20 MG TOTAL) BY MOUTH DAILY.   budesonide  (RHINOCORT  AQUA) 32 MCG/ACT nasal spray Place 64 mcg into the nose.   carboxymethylcellulose (REFRESH PLUS) 0.5 % SOLN INSTILL 1 DROP IN BOTH EYES FOUR TIMES A DAY AS NEEDED   cetirizine (ZYRTEC) 10 MG tablet Take by mouth.   citalopram  (CELEXA ) 10 MG tablet Take 1 tablet (10 mg total) by mouth daily.   diclofenac Sodium (VOLTAREN) 1 % GEL Apply 2 g topically 4 (four) times daily.   famotidine  (PEPCID ) 20 MG tablet One after supper   fluticasone  (FLONASE ) 50 MCG/ACT nasal spray Place 1-2 sprays into both nostrils daily.   hydrocortisone 2.5 % cream APPLY SMALL AMOUNT TO AFFECTED AREA TWICE A DAY AS NEEDED -MIX WITH KETOCONAZOLE CREAM AND APPLY TO THE RED AREAS  UNDER ARMPITS AND  IN THE GROIN TWICE DAILY AS NEEDED -MIX WITH KETOCONAZOLE CREAM AND APPLY TO THE RED AREAS UNDER ARMPITS AND   IN THE GROIN TWICE DAILY AS NEEDED   ibuprofen (ADVIL) 600 MG tablet Take 600 mg by mouth every 6 (six) hours as needed.   ketoconazole (NIZORAL) 2 % cream APPLY SMALL AMOUNT TO AFFECTED AREA TWICE A DAY -MIX WITH HYDROCORTISONE CREAM AND APPLY TO THE RED AREAS IN THE GROIN AND  ARMPIT TWICE DAILY AS NEEDED -MIX WITH HYDROCORTISONE CREAM AND APPLY TO THE RED AREAS IN THE GROIN AND  ARMPIT TWICE DAILY AS NEEDED   latanoprost (XALATAN) 0.005 % ophthalmic solution 1 drop at bedtime.    Mometasone Furoate  (ASMANEX  HFA) 200 MCG/ACT AERO Take 2 puffs first thing in am and then another 2 puffs about 12 hours later.   montelukast  (SINGULAIR ) 10 MG tablet Take 1 tablet (10 mg total) by mouth at bedtime.   Multiple Vitamin (MULTIVITAMIN) capsule Take 1 capsule by mouth daily.   omeprazole  (PRILOSEC) 40 MG capsule Take 30-60 min before first meal of the day   terazosin (HYTRIN) 5 MG capsule Take 5 mg by mouth at bedtime.   Tiotropium Bromide-Olodaterol (STIOLTO RESPIMAT) 2.5-2.5 MCG/ACT AERS Inhale 2 puffs into the lungs daily.   traZODone  (DESYREL ) 50 MG tablet Take 1 tablet (50 mg total) by mouth at bedtime as needed for sleep.   triamcinolone  cream (KENALOG ) 0.5 % Apply 1 Application topically 3 (three) times daily.   [DISCONTINUED] azithromycin  (ZITHROMAX ) 250 MG tablet Take 250 mg by mouth.     Allergies:   Patient has no known allergies.   Social History   Socioeconomic History   Marital status: Divorced    Spouse name: Not on file   Number of children: 2   Years of education: Not on file   Highest education level: Bachelor's degree (e.g., BA, AB, BS)  Occupational History   Occupation: retired    Comment: aeronautical engineer  Tobacco Use   Smoking status: Former    Current packs/day: 0.00    Average packs/day: 1 pack/day for 40.0 years (40.0 ttl pk-yrs)    Types: Cigarettes    Start date: 12/31/1968    Quit date: 12/31/2008    Years since quitting: 15.3   Smokeless tobacco: Never   Tobacco comments:    10 cigs per day  Vaping Use   Vaping status: Never Used  Substance and Sexual Activity   Alcohol use: Yes    Alcohol/week: 3.0 - 4.0 standard drinks of alcohol    Types: 3 - 4 Cans of beer per week    Comment: occassional, 3 beers or glasses of wine per week   Drug use: No   Sexual activity: Yes    Partners: Female  Other Topics Concern   Not on file  Social History Narrative   Not on file   Social Drivers of Health   Tobacco Use: Medium Risk (04/12/2024)    Patient History    Smoking Tobacco Use: Former    Smokeless Tobacco Use: Never    Passive Exposure: Not on file  Financial Resource Strain: Low Risk (12/11/2023)   Overall Financial Resource Strain (CARDIA)    Difficulty of Paying Living Expenses: Not hard at all  Food Insecurity: No Food Insecurity (12/11/2023)   Epic    Worried About Radiation Protection Practitioner of Food in the Last Year: Never true    Ran Out of Food in the Last Year: Never true  Transportation Needs: No Transportation Needs (12/11/2023)   Epic    Lack of Transportation (Medical): No    Lack of Transportation (Non-Medical): No  Physical Activity: Inactive (12/11/2023)   Exercise Vital Sign    Days of Exercise per Week: 0 days    Minutes of Exercise per Session: 0 min  Stress: No Stress Concern Present (12/11/2023)   [employer] of Occupational Health - Occupational Stress Questionnaire    Feeling of Stress: Not at all  Social Connections: Moderately Integrated (12/11/2023)   Social Connection and Isolation Panel    Frequency of Communication with Friends and Family: More than three times a week    Frequency of Social Gatherings with Friends and Family: Once a week    Attends Religious Services: More than 4 times per year    Active Member of Clubs or Organizations: Yes    Attends Banker Meetings: More than 4 times per year    Marital Status: Divorced  Depression (PHQ2-9): Low Risk (01/20/2024)   Depression (PHQ2-9)    PHQ-2 Score: 1  Alcohol Screen: Low Risk (12/11/2023)   Alcohol Screen    Last Alcohol Screening Score (AUDIT): 3  Housing: Unknown (12/11/2023)   Epic    Unable to Pay for Housing in the Last Year: No    Number of Times Moved in the Last Year: Not on file    Homeless in the Last Year: No  Utilities: Not At Risk (12/11/2023)   Epic    Threatened with loss of utilities: No  Health Literacy: Adequate Health Literacy (12/11/2023)   B1300 Health Literacy    Frequency of need for help with medical  instructions: Never     Family History: The patient's family history includes Emphysema in his father and mother. There is no history of Colon cancer, Rectal cancer, or Stomach cancer.  ROS:   Please see the history of present illness.     All other systems reviewed and are negative.  EKGs/Labs/Other Studies Reviewed:    The following studies were reviewed today:   EKG:   10/21/22: Normal sinus rhythm, left axis deviation, no ST abnormalities 05/12/2024: Sinus rhythm, left axis deviation, first-degree AV block, rate 68  Recent Labs: No results found for requested labs within last 365 days.  Recent Lipid Panel    Component Value Date/Time   CHOL 142 02/21/2023 1041   TRIG 41 02/21/2023 1041   HDL 74 02/21/2023 1041   CHOLHDL 1.9 02/21/2023 1041   CHOLHDL 3 08/14/2012 1158   VLDL 13.0 08/14/2012 1158   LDLCALC 58 02/21/2023 1041    Physical Exam:    VS:  BP 128/75 (BP Location: Left Arm, Patient Position: Sitting, Cuff Size: Large)   Pulse 68   Ht 5' 10.8 (1.798 m)   Wt 235 lb (106.6 kg)   BMI 32.96 kg/m     Wt Readings from Last 3 Encounters:  05/12/24 235 lb (106.6 kg)  04/12/24 231 lb (104.8 kg)  01/20/24 230 lb 12 oz (104.7 kg)     GEN:  Well nourished, well developed in no acute distress HEENT: Normal NECK: No JVD; No carotid bruits CARDIAC: RRR, no murmurs, rubs, gallops RESPIRATORY: Scattered expiratory wheezing ABDOMEN: Soft, non-tender, non-distended MUSCULOSKELETAL:  No edema; No deformity  SKIN: Warm and dry NEUROLOGIC:  Alert and oriented x 3 PSYCHIATRIC:  Normal affect   ASSESSMENT:    1. Coronary artery disease involving native coronary artery of native heart, unspecified whether angina present   2.  Precordial pain   3. Hyperlipidemia, unspecified hyperlipidemia type      PLAN:    CAD: CT chest for lung cancer screening 07/2022 noted to have coronary calcifications.  He reported atypical chest pain.  Also noted to have dyspnea with minimal  exertion, which could represent anginal equivalent.  Echocardiogram 11/14/2022 showed EF 60 to 65%, grade 1 diastolic dysfunction, normal RV function, no significant valvular disease.  Coronary CTA 11/19/2022 showed nonobstructive CAD with mild stenosis in proximal to mid LAD, calcium  score 507 (63rd percentile). - Continue atorvastatin  20 mg daily - Continue aspirin 81 mg daily - He is reporting atypical chest pain but does report can occur when he is under stress, suggesting possible angina.  Recommend stress PET for further evaluation  Hyperlipidemia: On atorvastatin  20 mg daily, LDL 58 on 02/21/2023.  Update lipid panel  OSA: on CPAP, reports compliance  RTC in 6 months   Informed Consent   Shared Decision Making/Informed Consent The risks [chest pain, shortness of breath, cardiac arrhythmias, dizziness, blood pressure fluctuations, myocardial infarction, stroke/transient ischemic attack, nausea, vomiting, allergic reaction, radiation exposure, metallic taste sensation and life-threatening complications (estimated to be 1 in 10,000)], benefits (risk stratification, diagnosing coronary artery disease, treatment guidance) and alternatives of a cardiac PET stress test were discussed in detail with Mr. Stairs and he agrees to proceed.       Medication Adjustments/Labs and Tests Ordered: Current medicines are reviewed at length with the patient today.  Concerns regarding medicines are outlined above.  Orders Placed This Encounter  Procedures   NM PET CT CARDIAC PERFUSION MULTI W/ABSOLUTE BLOODFLOW   Lipid panel   EKG 12-Lead   No orders of the defined types were placed in this encounter.   Patient Instructions  Medication Instructions:  Your physician recommends that you continue on your current medications as directed. Please refer to the Current Medication list given to you today.  *If you need a refill on your cardiac medications before your next appointment, please call your  pharmacy*  Lab Work: Lipid panel today If you have labs (blood work) drawn today and your tests are completely normal, you will receive your results only by: MyChart Message (if you have MyChart) OR A paper copy in the mail If you have any lab test that is abnormal or we need to change your treatment, we will call you to review the results.  Testing/Procedures:    Please report to Radiology at the [facility] Main Entrance 30 minutes early for your test.  [address] [place], [place] [address]                         OR   Please report to Radiology at [facility] Main Entrance, medical mall, 30 mins prior to your test.  [address]  [place], [place]  How to Prepare for Your Cardiac PET/CT Stress Test:  Nothing to eat or drink, except water, 3 hours prior to arrival time.  NO caffeine/decaffeinated products, or chocolate 12 hours prior to arrival. (Please note decaffeinated beverages (teas/coffees) still contain caffeine).  If you have caffeine within 12 hours prior, the test will need to be rescheduled.  Medication instructions: Do not take erectile dysfunction medications for 72 hours prior to test (sildenafil, tadalafil) Do not take nitrates (isosorbide mononitrate, Ranexa) the day before or day of test Do not take tamsulosin the day before or morning of test Hold theophylline containing  medications for 12 hours. Hold Dipyridamole 48 hours prior to the test.  Diabetic Preparation: If able to eat breakfast prior to 3 hour fasting, you may take all medications, including your insulin. Do not worry if you miss your breakfast dose of insulin - start at your next meal. If you do not eat prior to 3 hour fast-Hold all diabetes (oral and insulin) medications. Patients who wear a continuous glucose monitor MUST remove the device prior to scanning.  You may take your remaining medications with water.  NO perfume, cologne or lotion on chest  or abdomen area. FEMALES - Please avoid wearing dresses to this appointment.  Total time is 1 to 2 hours; you may want to bring reading material for the waiting time.  IF YOU THINK YOU MAY BE PREGNANT, OR ARE NURSING PLEASE INFORM THE TECHNOLOGIST.  In preparation for your appointment, medication and supplies will be purchased.  Appointment availability is limited, so if you need to cancel or reschedule, please call the Radiology Department Scheduler at [phone] 24 hours in advance to avoid a cancellation fee of $100.00  What to Expect When you Arrive:  Once you arrive and check in for your appointment, you will be taken to a preparation room within the Radiology Department.  A technologist or Nurse will obtain your medical history, verify that you are correctly prepped for the exam, and explain the procedure.  Afterwards, an IV will be started in your arm and electrodes will be placed on your skin for EKG monitoring during the stress portion of the exam. Then you will be escorted to the PET/CT scanner.  There, staff will get you positioned on the scanner and obtain a blood pressure and EKG.  During the exam, you will continue to be connected to the EKG and blood pressure machines.  A small, safe amount of a radioactive tracer will be injected in your IV to obtain a series of pictures of your heart along with an injection of a stress agent.    After your Exam:  It is recommended that you eat a meal and drink a caffeinated beverage to counter act any effects of the stress agent.  Drink plenty of fluids for the remainder of the day and urinate frequently for the first couple of hours after the exam.  Your doctor will inform you of your test results within 7-10 business days.  For more information and frequently asked questions, please visit our website: [email or web address]  For questions about your test or how to prepare for your test, please call: Cardiac Imaging Nurse  Navigators Office: [phone]   Follow-Up: At [facility], you and your health needs are our priority.  As part of our continuing mission to provide you with exceptional heart care, our providers are all part of one team.  This team includes your primary Cardiologist (physician) and Advanced Practice Providers or APPs (Physician Assistants and Nurse Practitioners) who all work together to provide you with the care you need, when you need it.  Your next appointment:   6 months  Provider:   Dr.Branston Halsted  We recommend signing up for the patient portal called MyChart.  Sign up information is provided on this After Visit Summary.  MyChart is used to connect with patients for Virtual Visits (Telemedicine).  Patients are able to view lab/test results, encounter notes, upcoming appointments, etc.  Non-urgent messages can be sent to your provider as well.   To learn more about what you can do with MyChart, go  to [email or web address].   Other Instructions none            Signed, Lonni LITTIE Nanas, MD  05/12/2024 12:26 PM    [facility] "

## 2024-05-12 ENCOUNTER — Ambulatory Visit: Admitting: Cardiology

## 2024-05-12 VITALS — BP 128/75 | HR 68 | Ht 70.8 in | Wt 235.0 lb

## 2024-05-12 DIAGNOSIS — R072 Precordial pain: Secondary | ICD-10-CM | POA: Diagnosis not present

## 2024-05-12 DIAGNOSIS — I2510 Atherosclerotic heart disease of native coronary artery without angina pectoris: Secondary | ICD-10-CM

## 2024-05-12 DIAGNOSIS — E785 Hyperlipidemia, unspecified: Secondary | ICD-10-CM

## 2024-05-12 LAB — LIPID PANEL

## 2024-05-12 NOTE — Patient Instructions (Signed)
 Medication Instructions:  Your physician recommends that you continue on your current medications as directed. Please refer to the Current Medication list given to you today.  *If you need a refill on your cardiac medications before your next appointment, please call your pharmacy*  Lab Work: Lipid panel today If you have labs (blood work) drawn today and your tests are completely normal, you will receive your results only by: MyChart Message (if you have MyChart) OR A paper copy in the mail If you have any lab test that is abnormal or we need to change your treatment, we will call you to review the results.  Testing/Procedures:    Please report to Radiology at the [facility] Main Entrance 30 minutes early for your test.  [address] [place], [place] [address]                         OR   Please report to Radiology at [facility] Main Entrance, medical mall, 30 mins prior to your test.  [address]  [place], [place]  How to Prepare for Your Cardiac PET/CT Stress Test:  Nothing to eat or drink, except water, 3 hours prior to arrival time.  NO caffeine/decaffeinated products, or chocolate 12 hours prior to arrival. (Please note decaffeinated beverages (teas/coffees) still contain caffeine).  If you have caffeine within 12 hours prior, the test will need to be rescheduled.  Medication instructions: Do not take erectile dysfunction medications for 72 hours prior to test (sildenafil, tadalafil) Do not take nitrates (isosorbide mononitrate, Ranexa) the day before or day of test Do not take tamsulosin the day before or morning of test Hold theophylline containing medications for 12 hours. Hold Dipyridamole 48 hours prior to the test.  Diabetic Preparation: If able to eat breakfast prior to 3 hour fasting, you may take all medications, including your insulin. Do not worry if you miss your breakfast dose of insulin - start at your next  meal. If you do not eat prior to 3 hour fast-Hold all diabetes (oral and insulin) medications. Patients who wear a continuous glucose monitor MUST remove the device prior to scanning.  You may take your remaining medications with water.  NO perfume, cologne or lotion on chest or abdomen area. FEMALES - Please avoid wearing dresses to this appointment.  Total time is 1 to 2 hours; you may want to bring reading material for the waiting time.  IF YOU THINK YOU MAY BE PREGNANT, OR ARE NURSING PLEASE INFORM THE TECHNOLOGIST.  In preparation for your appointment, medication and supplies will be purchased.  Appointment availability is limited, so if you need to cancel or reschedule, please call the Radiology Department Scheduler at [phone] 24 hours in advance to avoid a cancellation fee of $100.00  What to Expect When you Arrive:  Once you arrive and check in for your appointment, you will be taken to a preparation room within the Radiology Department.  A technologist or Nurse will obtain your medical history, verify that you are correctly prepped for the exam, and explain the procedure.  Afterwards, an IV will be started in your arm and electrodes will be placed on your skin for EKG monitoring during the stress portion of the exam. Then you will be escorted to the PET/CT scanner.  There, staff will get you positioned on the scanner and obtain a blood pressure and EKG.  During the exam, you will continue to be  connected to the EKG and blood pressure machines.  A small, safe amount of a radioactive tracer will be injected in your IV to obtain a series of pictures of your heart along with an injection of a stress agent.    After your Exam:  It is recommended that you eat a meal and drink a caffeinated beverage to counter act any effects of the stress agent.  Drink plenty of fluids for the remainder of the day and urinate frequently for the first couple of hours after the exam.  Your doctor will  inform you of your test results within 7-10 business days.  For more information and frequently asked questions, please visit our website: [email or web address]  For questions about your test or how to prepare for your test, please call: Cardiac Imaging Nurse Navigators Office: [phone]   Follow-Up: At [facility], you and your health needs are our priority.  As part of our continuing mission to provide you with exceptional heart care, our providers are all part of one team.  This team includes your primary Cardiologist (physician) and Advanced Practice Providers or APPs (Physician Assistants and Nurse Practitioners) who all work together to provide you with the care you need, when you need it.  Your next appointment:   6 months  Provider:   Dr.Schumann  We recommend signing up for the patient portal called MyChart.  Sign up information is provided on this After Visit Summary.  MyChart is used to connect with patients for Virtual Visits (Telemedicine).  Patients are able to view lab/test results, encounter notes, upcoming appointments, etc.  Non-urgent messages can be sent to your provider as well.   To learn more about what you can do with MyChart, go to [email or web address].   Other Instructions none

## 2024-05-13 ENCOUNTER — Other Ambulatory Visit: Payer: Self-pay | Admitting: Cardiology

## 2024-05-13 LAB — LIPID PANEL
Cholesterol, Total: 134 mg/dL (ref 100–199)
HDL: 68 mg/dL
LDL CALC COMMENT:: 2 ratio (ref 0.0–5.0)
LDL Chol Calc (NIH): 53 mg/dL (ref 0–99)
Triglycerides: 64 mg/dL (ref 0–149)
VLDL Cholesterol Cal: 13 mg/dL (ref 5–40)

## 2024-05-14 ENCOUNTER — Ambulatory Visit: Payer: Self-pay | Admitting: Cardiology

## 2024-05-20 NOTE — Telephone Encounter (Signed)
 Lipid panel done on 05/12/2024

## 2024-06-08 ENCOUNTER — Other Ambulatory Visit (HOSPITAL_COMMUNITY)

## 2024-06-21 ENCOUNTER — Ambulatory Visit: Admitting: Cardiology

## 2024-07-19 ENCOUNTER — Ambulatory Visit: Admitting: Family Medicine

## 2025-01-20 ENCOUNTER — Ambulatory Visit
